# Patient Record
Sex: Female | Born: 1955
Health system: Southern US, Community
[De-identification: ages and names within clinical notes are randomized; demographics above are authoritative.]

## PROBLEM LIST (undated history)

## (undated) DIAGNOSIS — N6009 Solitary cyst of unspecified breast: Secondary | ICD-10-CM

## (undated) DIAGNOSIS — R51 Headache: Secondary | ICD-10-CM

## (undated) DIAGNOSIS — I10 Essential (primary) hypertension: Secondary | ICD-10-CM

## (undated) DIAGNOSIS — G8929 Other chronic pain: Secondary | ICD-10-CM

## (undated) DIAGNOSIS — F419 Anxiety disorder, unspecified: Secondary | ICD-10-CM

## (undated) DIAGNOSIS — K219 Gastro-esophageal reflux disease without esophagitis: Secondary | ICD-10-CM

## (undated) DIAGNOSIS — R519 Headache, unspecified: Secondary | ICD-10-CM

## (undated) DIAGNOSIS — M549 Dorsalgia, unspecified: Secondary | ICD-10-CM

## (undated) HISTORY — DX: Gastro-esophageal reflux disease without esophagitis: K21.9

## (undated) HISTORY — DX: Anxiety disorder, unspecified: F41.9

## (undated) HISTORY — DX: Solitary cyst of unspecified breast: N60.09

## (undated) HISTORY — PX: BREAST SURGERY: SHX581

## (undated) HISTORY — DX: Headache: R51

## (undated) HISTORY — PX: OTHER SURGICAL HISTORY: SHX169

## (undated) HISTORY — DX: Headache, unspecified: R51.9

## (undated) HISTORY — DX: Other chronic pain: G89.29

## (undated) HISTORY — DX: Dorsalgia, unspecified: M54.9

## (undated) HISTORY — DX: Hereditary hemochromatosis: E83.110

---
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
## 1999-03-29 HISTORY — PX: ABDOMINAL HYSTERECTOMY: SHX81

## 2005-05-26 LAB — HM COLONOSCOPY

## 2006-06-08 HISTORY — PX: COLONOSCOPY: SHX174

## 2011-10-14 ENCOUNTER — Encounter: Payer: Self-pay | Admitting: Internal Medicine

## 2011-10-14 ENCOUNTER — Ambulatory Visit (INDEPENDENT_AMBULATORY_CARE_PROVIDER_SITE_OTHER): Payer: Managed Care, Other (non HMO) | Admitting: Internal Medicine

## 2011-10-14 VITALS — BP 138/86 | HR 68 | Temp 98.5°F | Ht 62.0 in | Wt 212.0 lb

## 2011-10-14 DIAGNOSIS — Z1239 Encounter for other screening for malignant neoplasm of breast: Secondary | ICD-10-CM

## 2011-10-14 DIAGNOSIS — E781 Pure hyperglyceridemia: Secondary | ICD-10-CM

## 2011-10-14 DIAGNOSIS — E669 Obesity, unspecified: Secondary | ICD-10-CM

## 2011-10-14 DIAGNOSIS — E785 Hyperlipidemia, unspecified: Secondary | ICD-10-CM

## 2011-10-14 DIAGNOSIS — R5383 Other fatigue: Secondary | ICD-10-CM

## 2011-10-14 DIAGNOSIS — E559 Vitamin D deficiency, unspecified: Secondary | ICD-10-CM

## 2011-10-14 NOTE — Progress Notes (Signed)
Patient ID: Rhonda Brock, female   DOB: Apr 12, 1955, 56 y.o.   MRN: [id number]  Patient Active Problem List  Diagnosis  . Obesity (BMI 30-39.9)  . Vitamin d deficiency  . Hypertriglyceridemia    Subjective:  CC:   Chief Complaint  Patient presents with  . Establish Care    New pt to establish care    HPI:   Rhonda Brock is a 56 y.o. female who presents as a new patient to establish primary care with the chief complaint of  Need for new physician. 1) she has a history of Hypertriglyceridemia which is currently managed with diet alone. She has never had triglycerides of 300 and has no history of alcohol abuse diabetes or pancreatitis.Marland Kitchen  2) Bilteral pain back of the knees which started a few days ago. No recent travel or prolonged sitting. She has shoes which she has been wearing daily for the last several days which has a slightly lower heel and lower arch than her previously worn  Shoes.   Past Medical History  Diagnosis Date  . GERD (gastroesophageal reflux disease)   . Chronic headaches   . Anxiety   . Cyst (solitary) of breast     bilaterally  . Back pain     Past Surgical History  Procedure Date  . Appendectomy 1962  . Tonsillectomy 1970  . Abdominal hysterectomy 2001  . Breast surgery 2004, 2005    left breast cyst drained x 2  . Breast cysts     Family History  Problem Relation Age of Onset  . Cancer Mother 5    Breast Cancer-Mastectomy, Also Abdominal cancer  . Asthma Father     History   Social History  . Marital Status: Married    Spouse Name: N/A    Number of Children: N/A  . Years of Education: 14   Occupational History  . Homemaker    Social History Main Topics  . Smoking status: Never Smoker   . Smokeless tobacco: Never Used  . Alcohol Use: No  . Drug Use: No  . Sexually Active:    Other Topics Concern  . Not on file   Social History Narrative   Regular exercise-yesCaffeine use-yes    No Known Allergies     Review of  Systems:  Review of Systems  Constitutional: Negative for fever and malaise/fatigue.  HENT: Negative for nosebleeds and sore throat.   Eyes: Negative for blurred vision and pain.  Respiratory: Negative for cough, sputum production and shortness of breath.   Cardiovascular: Negative for chest pain, palpitations, orthopnea and claudication.  Gastrointestinal: Positive for heartburn. Negative for blood in stool.  Genitourinary: Negative for dysuria and frequency.  Musculoskeletal: Positive for back pain and falls.  Skin: Negative for rash.  Neurological: Negative for dizziness, seizures and headaches.  Endo/Heme/Allergies: Does not bruise/bleed easily.  Psychiatric/Behavioral: Negative for depression. The patient does not have insomnia.       Objective:  BP 138/86  Pulse 68  Temp 98.5 F (36.9 C) (Oral)  Ht 5\' 2"  (1.575 m)  Wt 212 lb (96.163 kg)  BMI 38.78 kg/m2  SpO2 97%  General appearance: alert, cooperative and appears stated age Ears: normal TM's and external ear canals both ears Throat: lips, mucosa, and tongue normal; teeth and gums normal Neck: no adenopathy, no carotid bruit, supple, symmetrical, trachea midline and thyroid not enlarged, symmetric, no tenderness/mass/nodules Back: symmetric, no curvature. ROM normal. No CVA tenderness. Lungs: clear to auscultation bilaterally Heart: regular rate and  rhythm, S1, S2 normal, no murmur, click, rub or gallop Abdomen: soft, non-tender; bowel sounds normal; no masses,  no organomegaly Pulses: 2+ and symmetric Skin: Skin color, texture, turgor normal. No rashes or lesions Lymph nodes: Cervical, supraclavicular, and axillary nodes normal.  Assessment and Plan:  Obesity (BMI 30-39.9) I have addressed  BMI and recommended a low glycemic index diet utilizing smaller more frequent meals to increase metabolism.  I have also recommended that patient start exercising with a goal of 30 minutes of aerobic exercise a minimum of 5 days  per week. Screening for lipid disorders, thyroid and diabetes to be done today.    Vitamin d deficiency Meds with weekly vitamin D megadosing. We'll check vitamin D level with next set of labs to make sure she is not overcompensating.  Hypertriglyceridemia Per patient mild. No she will return for fasting lipids. Low carbohydrate diet and exercise recommended until then. Discussed fish oil and niacin   Updated Medication List Outpatient Encounter Prescriptions as of 10/14/2011  Medication Sig Dispense Refill  . ergocalciferol (VITAMIN D2) 50000 UNITS capsule Take 50,000 Units by mouth once a week.         Orders Placed This Encounter  Procedures  . HM MAMMOGRAPHY  . MM Digital Screening  . HM PAP SMEAR  . TSH  . CBC with Differential  . Lipid panel  . Comprehensive metabolic panel  . HM COLONOSCOPY    No Follow-up on file.

## 2011-10-17 ENCOUNTER — Encounter: Payer: Self-pay | Admitting: Internal Medicine

## 2011-10-17 DIAGNOSIS — E669 Obesity, unspecified: Secondary | ICD-10-CM | POA: Insufficient documentation

## 2011-10-17 DIAGNOSIS — E559 Vitamin D deficiency, unspecified: Secondary | ICD-10-CM | POA: Insufficient documentation

## 2011-10-17 DIAGNOSIS — E781 Pure hyperglyceridemia: Secondary | ICD-10-CM | POA: Insufficient documentation

## 2011-10-17 DIAGNOSIS — E782 Mixed hyperlipidemia: Secondary | ICD-10-CM | POA: Insufficient documentation

## 2011-10-17 NOTE — Assessment & Plan Note (Signed)
Meds with weekly vitamin D megadosing. We'll check vitamin D level with next set of labs to make sure she is not overcompensating.

## 2011-10-17 NOTE — Assessment & Plan Note (Signed)
Per patient mild. No she will return for fasting lipids. Low carbohydrate diet and exercise recommended until then. Discussed fish oil and niacin

## 2011-10-17 NOTE — Assessment & Plan Note (Signed)
I have addressed  BMI and recommended a low glycemic index diet utilizing smaller more frequent meals to increase metabolism.  I have also recommended that patient start exercising with a goal of 30 minutes of aerobic exercise a minimum of 5 days per week. Screening for lipid disorders, thyroid and diabetes to be done today.   

## 2011-10-24 ENCOUNTER — Telehealth: Payer: Self-pay | Admitting: Internal Medicine

## 2011-10-24 NOTE — Telephone Encounter (Signed)
Recent labs reviewed liver kidney thyroid and CBC are all normal. Cholesterol total is 201 triglycerides 173 LDL 117 , the triglycerides are a little bit elevated ( normal is 150 or less).  She does not need medication but should try cutting back on  starches in her diet and if she has not started regular exercise program, begin one. Repeat in 6 months.

## 2011-10-25 NOTE — Telephone Encounter (Signed)
Tried calling patient, but phone just rang and rang and there was no way to leave a message, will try and call back later.

## 2011-11-01 NOTE — Telephone Encounter (Signed)
Patient notified of results.

## 2011-11-02 ENCOUNTER — Encounter: Payer: Self-pay | Admitting: Internal Medicine

## 2011-11-17 ENCOUNTER — Encounter: Payer: Self-pay | Admitting: Internal Medicine

## 2011-12-08 ENCOUNTER — Ambulatory Visit (INDEPENDENT_AMBULATORY_CARE_PROVIDER_SITE_OTHER): Payer: Managed Care, Other (non HMO) | Admitting: Internal Medicine

## 2011-12-08 ENCOUNTER — Encounter: Payer: Self-pay | Admitting: Internal Medicine

## 2011-12-08 VITALS — BP 130/86 | HR 74 | Temp 98.6°F | Resp 16 | Wt 212.5 lb

## 2011-12-08 DIAGNOSIS — N63 Unspecified lump in unspecified breast: Secondary | ICD-10-CM

## 2011-12-08 DIAGNOSIS — E559 Vitamin D deficiency, unspecified: Secondary | ICD-10-CM

## 2011-12-08 DIAGNOSIS — N6310 Unspecified lump in the right breast, unspecified quadrant: Secondary | ICD-10-CM

## 2011-12-08 NOTE — Progress Notes (Signed)
Patient ID: Rhonda Brock, female   DOB: 1956-03-26, 56 y.o.   MRN: [id number] Here for annual exam. Last Pelvic was done July 2012 by Dr. Harold Hedge. She is up-to-date on mammogram and colonoscopy. She has no new complaints. He is wearing her seatbelt when driving or riding in a car present time. She has been exercising regularly with walking program 4 days per week. She has not had any weight loss Subjective:     Rhonda Brock is a 56 y.o. female and is here for a comprehensive physical exam. The patient reports no problems.  History   Social History  . Marital Status: Married    Spouse Name: N/A    Number of Children: N/A  . Years of Education: 14   Occupational History  . Homemaker    Social History Main Topics  . Smoking status: Never Smoker   . Smokeless tobacco: Never Used  . Alcohol Use: No  . Drug Use: No  . Sexually Active:    Other Topics Concern  . Not on file   Social History Narrative   Regular exercise-yesCaffeine use-yes   Health Maintenance  Topic Date Due  . Influenza Vaccine  12/27/2011  . Pap Smear  09/25/2013  . Mammogram  11/06/2013  . Colonoscopy  05/27/2015  . Tetanus/tdap  03/28/2021    The following portions of the patient's history were reviewed and updated as appropriate: allergies, current medications, past family history, past medical history, past social history, past surgical history and problem list.  Review of Systems A comprehensive review of systems was negative.   Objective:    BP 130/86  Pulse 74  Temp 98.6 F (37 C) (Oral)  Resp 16  Wt 212 lb 8 oz (96.389 kg)  SpO2 97% General appearance: alert, cooperative and appears stated age Eyes: conjunctivae/corneas clear. PERRL, EOM's intact. Fundi benign. Ears: normal TM's and external ear canals both ears Throat: lips, mucosa, and tongue normal; teeth and gums normal Neck: no adenopathy, no carotid bruit, no JVD, supple, symmetrical, trachea midline and thyroid not enlarged, symmetric,  no tenderness/mass/nodules Back: symmetric, no curvature. ROM normal. No CVA tenderness. Lungs: clear to auscultation bilaterally Breasts: normal appearance, no masses or tenderness, positive findings: firm nodule located on the left upper outer quadrant Heart: regular rate and rhythm, S1, S2 normal, no murmur, click, rub or gallop Abdomen: soft, non-tender; bowel sounds normal; no masses,  no organomegaly Pelvic: cervix normal in appearance, external genitalia normal, no adnexal masses or tenderness, no cervical motion tenderness, rectovaginal septum normal, uterus normal size, shape, and consistency and vagina normal without discharge Extremities: extremities normal, atraumatic, no cyanosis or edema Pulses: 2+ and symmetric Skin: Skin color, texture, turgor normal. No rashes or lesions Lymph nodes: Cervical, supraclavicular, and axillary nodes normal. Neurologic: Grossly normal    Assessment:    Healthy female exam.    breast nodule noted and daignostic mammogram ordered.   Mass of breast, right Lateral nipple   Iron overload Per patient she was told that her iron stores were high in the past but no further workup was done. We will repeat these today.  Vitamin d deficiency Repeat vitamin D level ordered for today.   Updated Medication List Outpatient Encounter Prescriptions as of 12/08/2011  Medication Sig Dispense Refill  . ergocalciferol (VITAMIN D2) 50000 UNITS capsule Take 50,000 Units by mouth once a week.

## 2011-12-08 NOTE — Patient Instructions (Addendum)
We are checking iron and vitamin D today  And are scheduling a diagnostic mammogram on the right breast to check out the lump that I felt on the right side of your nipple

## 2011-12-08 NOTE — Assessment & Plan Note (Signed)
Lateral nipple

## 2011-12-09 ENCOUNTER — Encounter: Payer: Self-pay | Admitting: Internal Medicine

## 2011-12-09 LAB — IRON AND TIBC
TIBC: 221 ug/dL — ABNORMAL LOW (ref 250–470)
UIBC: 81 ug/dL — ABNORMAL LOW (ref 125–400)

## 2011-12-09 NOTE — Addendum Note (Signed)
Addended by: Duncan Dull on: 12/09/2011 07:26 AM   Modules accepted: Orders

## 2011-12-09 NOTE — Assessment & Plan Note (Signed)
Repeat vitamin D level ordered for today.

## 2011-12-09 NOTE — Assessment & Plan Note (Signed)
Per patient she was told that her iron stores were high in the past but no further workup was done. We will repeat these today.

## 2011-12-16 ENCOUNTER — Ambulatory Visit: Payer: Self-pay | Admitting: Hematology and Oncology

## 2011-12-16 LAB — IRON AND TIBC
Iron Bind.Cap.(Total): 226 ug/dL — ABNORMAL LOW (ref 250–450)
Iron Saturation: 58 %
Iron: 130 ug/dL (ref 50–170)
Unbound Iron-Bind.Cap.: 96 ug/dL

## 2011-12-16 LAB — CBC CANCER CENTER
Basophil #: 0.1 x10 3/mm (ref 0.0–0.1)
Basophil %: 0.8 %
Eosinophil #: 0.4 x10 3/mm (ref 0.0–0.7)
Lymphocyte %: 23.8 %
MCH: 31.9 pg (ref 26.0–34.0)
MCHC: 33.4 g/dL (ref 32.0–36.0)
Monocyte #: 0.7 x10 3/mm (ref 0.2–0.9)
Neutrophil #: 6.7 x10 3/mm — ABNORMAL HIGH (ref 1.4–6.5)
Neutrophil %: 64.8 %
RDW: 13.1 % (ref 11.5–14.5)

## 2011-12-18 ENCOUNTER — Telehealth: Payer: Self-pay | Admitting: Internal Medicine

## 2011-12-18 ENCOUNTER — Encounter: Payer: Self-pay | Admitting: Internal Medicine

## 2011-12-18 DIAGNOSIS — N6310 Unspecified lump in the right breast, unspecified quadrant: Secondary | ICD-10-CM

## 2011-12-18 NOTE — Telephone Encounter (Signed)
Targeted ultrasound and diagnostic mammogram of the area of concern on the right breast  were normal.

## 2011-12-18 NOTE — Assessment & Plan Note (Signed)
Targeted ultrasound and diagnostic mammogram of the area of concern were normal.

## 2011-12-19 NOTE — Telephone Encounter (Signed)
Message left notifying patient.

## 2011-12-26 ENCOUNTER — Encounter: Payer: Self-pay | Admitting: Internal Medicine

## 2011-12-27 ENCOUNTER — Ambulatory Visit: Payer: Self-pay | Admitting: Hematology and Oncology

## 2012-01-27 ENCOUNTER — Ambulatory Visit: Payer: Self-pay | Admitting: Hematology and Oncology

## 2012-05-12 ENCOUNTER — Other Ambulatory Visit: Payer: Self-pay

## 2013-01-31 ENCOUNTER — Other Ambulatory Visit: Payer: Self-pay

## 2014-02-19 LAB — HM MAMMOGRAPHY: HM MAMMO: NEGATIVE

## 2014-03-10 ENCOUNTER — Encounter: Payer: Self-pay | Admitting: Internal Medicine

## 2014-03-12 LAB — HM PAP SMEAR

## 2014-03-17 ENCOUNTER — Encounter: Payer: Self-pay | Admitting: Internal Medicine

## 2014-04-22 ENCOUNTER — Encounter: Payer: Self-pay | Admitting: Internal Medicine

## 2014-06-30 ENCOUNTER — Encounter: Payer: Self-pay | Admitting: Internal Medicine

## 2014-06-30 ENCOUNTER — Ambulatory Visit (INDEPENDENT_AMBULATORY_CARE_PROVIDER_SITE_OTHER): Payer: Managed Care, Other (non HMO) | Admitting: Internal Medicine

## 2014-06-30 VITALS — BP 158/98 | HR 70 | Temp 98.7°F | Resp 16 | Ht 65.0 in | Wt 214.5 lb

## 2014-06-30 DIAGNOSIS — E669 Obesity, unspecified: Secondary | ICD-10-CM

## 2014-06-30 DIAGNOSIS — E785 Hyperlipidemia, unspecified: Secondary | ICD-10-CM

## 2014-06-30 DIAGNOSIS — E559 Vitamin D deficiency, unspecified: Secondary | ICD-10-CM | POA: Diagnosis not present

## 2014-06-30 DIAGNOSIS — E781 Pure hyperglyceridemia: Secondary | ICD-10-CM

## 2014-06-30 DIAGNOSIS — R030 Elevated blood-pressure reading, without diagnosis of hypertension: Secondary | ICD-10-CM | POA: Diagnosis not present

## 2014-06-30 DIAGNOSIS — R140 Abdominal distension (gaseous): Secondary | ICD-10-CM

## 2014-06-30 DIAGNOSIS — D649 Anemia, unspecified: Secondary | ICD-10-CM | POA: Diagnosis not present

## 2014-06-30 LAB — CBC WITH DIFFERENTIAL/PLATELET
Basophils Absolute: 0.1 10*3/uL (ref 0.0–0.1)
Basophils Relative: 0.7 % (ref 0.0–3.0)
EOS PCT: 4 % (ref 0.0–5.0)
Eosinophils Absolute: 0.3 10*3/uL (ref 0.0–0.7)
HCT: 42.1 % (ref 36.0–46.0)
Hemoglobin: 14.5 g/dL (ref 12.0–15.0)
LYMPHS PCT: 27.7 % (ref 12.0–46.0)
Lymphs Abs: 2.4 10*3/uL (ref 0.7–4.0)
MCHC: 34.5 g/dL (ref 30.0–36.0)
MCV: 93.4 fl (ref 78.0–100.0)
MONOS PCT: 6.8 % (ref 3.0–12.0)
Monocytes Absolute: 0.6 10*3/uL (ref 0.1–1.0)
NEUTROS ABS: 5.3 10*3/uL (ref 1.4–7.7)
NEUTROS PCT: 60.8 % (ref 43.0–77.0)
Platelets: 300 10*3/uL (ref 150.0–400.0)
RBC: 4.5 Mil/uL (ref 3.87–5.11)
RDW: 13.1 % (ref 11.5–15.5)
WBC: 8.7 10*3/uL (ref 4.0–10.5)

## 2014-06-30 LAB — COMPREHENSIVE METABOLIC PANEL
ALT: 24 U/L (ref 0–35)
AST: 22 U/L (ref 0–37)
Albumin: 4 g/dL (ref 3.5–5.2)
Alkaline Phosphatase: 71 U/L (ref 39–117)
BILIRUBIN TOTAL: 0.5 mg/dL (ref 0.2–1.2)
BUN: 16 mg/dL (ref 6–23)
CO2: 30 mEq/L (ref 19–32)
CREATININE: 0.87 mg/dL (ref 0.40–1.20)
Calcium: 9.9 mg/dL (ref 8.4–10.5)
Chloride: 106 mEq/L (ref 96–112)
GFR: 70.8 mL/min (ref >60.00)
GLUCOSE: 97 mg/dL (ref 70–99)
Potassium: 4.4 mEq/L (ref 3.5–5.1)
Sodium: 139 mEq/L (ref 135–145)
Total Protein: 7 g/dL (ref 6.0–8.3)

## 2014-06-30 LAB — LIPID PANEL
CHOL/HDL RATIO: 4
Cholesterol: 187 mg/dL (ref 0–200)
HDL: 44.9 mg/dL (ref >39.00)
NonHDL: 142.1
Triglycerides: 231 mg/dL — ABNORMAL HIGH (ref 0.0–149.0)
VLDL: 46.2 mg/dL — ABNORMAL HIGH (ref 0.0–40.0)

## 2014-06-30 LAB — VITAMIN D 25 HYDROXY (VIT D DEFICIENCY, FRACTURES): VITD: 37.45 ng/mL (ref 30.00–100.00)

## 2014-06-30 LAB — LDL CHOLESTEROL, DIRECT: LDL DIRECT: 119 mg/dL

## 2014-06-30 LAB — TSH: TSH: 2.21 u[IU]/mL (ref 0.35–4.50)

## 2014-06-30 NOTE — Progress Notes (Signed)
Pre-visit discussion using our clinic review tool. No additional management support is needed unless otherwise documented below in the visit note.  

## 2014-06-30 NOTE — Progress Notes (Signed)
Patient ID: Rhonda Brock, female   DOB: 09/05/1955, 59 y.o.   MRN: [id number] Patient Active Problem List   Diagnosis Date Noted  . Blood pressure elevated without history of HTN 07/01/2014  . Abdominal distension (gaseous) 07/01/2014  . Iron overload 12/09/2011  . Mass of breast, right 12/08/2011  . Obesity (BMI 30-39.9) 10/17/2011  . Vitamin D deficiency 10/17/2011  . Hypertriglyceridemia 10/17/2011    Subjective:  CC:   Chief Complaint  Patient presents with  . Follow-up    General check up, tightness in theabdpominal area  . Headache    Morning headache x2 weekly     HPI:   Rhonda Brock is a 59 y.o. female who presents for evaluation of multiple issues, was last seen Sept 2013.   1) abdominal distension and tihtness occurring daily for the past week, not occurring with activity or exercise,   More often after she eats.   Slight nausea no dyspnea accompanying symptoms Bowels moving regularly sometimes twice daily. Occures mostly at home,  Not at work or when out of the house.  Wonders is stress is a factor.    2) occasional headache not daily for the past 2 weeks,  occurring x 2 weeks.  Using ibuprofen   3) Obesity.  She is not exercising regularly or making a concerted effort to lose weight.   4) Hypertension: BP is elevated today. She has no history of hypertension,  But has been using NSAIDs on a daily basis for headaches.     Past Medical History  Diagnosis Date  . GERD (gastroesophageal reflux disease)   . Chronic headaches   . Anxiety   . Cyst (solitary) of breast     bilaterally  . Back pain     Past Surgical History  Procedure Laterality Date  . Appendectomy  1962  . Tonsillectomy  1970  . Abdominal hysterectomy  2001  . Breast surgery  2004, 2005    left breast cyst drained x 2  . Breast cysts         The following portions of the patient's history were reviewed and updated as appropriate: Allergies, current medications, and problem  list.    Review of Systems:   Patient denies headache, fevers, malaise, unintentional weight loss, skin rash, eye pain, sinus congestion and sinus pain, sore throat, dysphagia,  hemoptysis , cough, dyspnea, wheezing, chest pain, palpitations, orthopnea, edema, abdominal pain, nausea, melena, diarrhea, constipation, flank pain, dysuria, hematuria, urinary  Frequency, nocturia, numbness, tingling, seizures,  Focal weakness, Loss of consciousness,  Tremor, insomnia, depression, anxiety, and suicidal ideation.     History   Social History  . Marital Status: Married    Spouse Name: N/A  . Number of Children: N/A  . Years of Education: 14   Occupational History  . Homemaker    Social History Main Topics  . Smoking status: Never Smoker   . Smokeless tobacco: Never Used  . Alcohol Use: No  . Drug Use: No  . Sexual Activity: Not on file   Other Topics Concern  . Not on file   Social History Narrative   Regular exercise-yes   Caffeine use-yes          Objective:  Filed Vitals:   06/30/14 0809  BP: 158/98  Pulse: 70  Temp: 98.7 F (37.1 C)  Resp: 16     General appearance: alert, cooperative and appears stated age Ears: normal TM's and external ear canals both ears Throat: lips, mucosa, and tongue  normal; teeth and gums normal Neck: no adenopathy, no carotid bruit, supple, symmetrical, trachea midline and thyroid not enlarged, symmetric, no tenderness/mass/nodules Back: symmetric, no curvature. ROM normal. No CVA tenderness. Lungs: clear to auscultation bilaterally Heart: regular rate and rhythm, S1, S2 normal, no murmur, click, rub or gallop Abdomen: soft, non-tender; bowel sounds normal; no masses,  no organomegaly Pulses: 2+ and symmetric Skin: Skin color, texture, turgor normal. No rashes or lesions Lymph nodes: Cervical, supraclavicular, and axillary nodes normal.  Assessment and Plan:  Blood pressure elevated without history of HTN She ha been advised to  suspend all NSAIDs and return in one week for BP check.    Obesity (BMI 30-39.9) I have addressed  BMI and recommended a low glycemic index diet utilizing smaller more frequent meals to increase metabolism.  I have also recommended that patient start exercising with a goal of 30 minutes of aerobic exercise a minimum of 5 days per week. Screening for lipid disorders, thyroid and diabetes to be done today.  Lab Results  Component Value Date   TSH 2.21 06/30/2014   Lab Results  Component Value Date   CHOL 187 06/30/2014   HDL 44.90 06/30/2014   LDLDIRECT 119.0 06/30/2014   TRIG 231.0* 06/30/2014   CHOLHDL 4 06/30/2014     Hypertriglyceridemia Mild, with normal LDL and HDL. .  Low GI diet and exercise program recommended.    Abdominal distension (gaseous) Exam suggests gas vs cholecystitis vs IBS>   Trial of Beano and Gas X.  If no improvement   Ultrasound.  ,        Iron overload Her  iron stores remain slightly elevated but her hgb is normal , as are her liver enzymes.      Updated Medication List Outpatient Encounter Prescriptions as of 06/30/2014  Medication Sig  . Cholecalciferol (VITAMIN D3) 5000 UNITS CAPS Take 1 capsule by mouth daily.  . [DISCONTINUED] ergocalciferol (VITAMIN D2) 50000 UNITS capsule Take 50,000 Units by mouth once a week.     Orders Placed This Encounter  Procedures  . CBC with Differential/Platelet  . Comprehensive metabolic panel  . TSH  . Lipid panel  . Vit D  25 hydroxy (rtn osteoporosis monitoring)  . Iron and TIBC  . LDL cholesterol, direct    Return in about 1 week (around 07/07/2014).

## 2014-06-30 NOTE — Patient Instructions (Signed)
Our abdominal distension may be due to increased gas follosing a healthy meal.  Please try taking Beano (tablets or liquid) with lunch.  Rhonda Brock can also try Gax x afterward if the symptoms recur  If this does not help, I recommend getting an abdominal ultrasound  Please return in one week for a BP check .  DO NOT TAKE IBUPROFEN for 24 hours prior to BP check.  Your BMI is 36.  I want you to address your weight this year.   This is  my version of a  "Low GI"  Weight loss Diet:  It will allow you to lose 4 to 8  lbs  per month if you follow it carefully.  Your goal with exercise is a minimum of 30 minutes of aerobic exercise 5 days per week (Walking does not count once it becomes easy!)     All of the foods can be found at grocery stores and in bulk at Rhonda Brock.  The Rhonda Brock protein bars and shakes are available in more varieties at Rhonda Brock, Rhonda Brock and Rhonda Brock.     7 AM Breakfast:  Choose from the following:  Low carbohydrate Protein  Shakes (I recommend the EAS AdvantEdge "Carb Control" shakes, Rhonda Brock,  Muscle Milk or Premier Protein shakes  All are < 4  carbs   a scrambled egg/bacon/cheese burrito made with [employer]'s "carb balance" whole wheat tortilla  (about 10 net carbs )  A slice of home made fritatta (egg based dish without a crust:  google it)    Avoid cereal and bananas, oatmeal and cream of wheat and grits. They are loaded with carbohydrates!   10 AM: high protein snack  Protein bar by Rhonda Brock  Or Rhonda Brock  (the snack size, under 200 cal, usually < 6 net carbs).    A stick of cheese:  Around 1 carb,  100 cal      Other so called "protein bars" and Greek yogurts tend to be loaded with carbohydrates.  Remember, in food advertising, the word "energy" is synonymous for " carbohydrate."  Lunch:   A Sandwich using the bread choices listed, Can use any  Eggs,  lunchmeat, grilled meat or canned tuna), avocado, regular mayo/mustard  and cheese.  A Salad using blue cheese, ranch,  Goddess  or vinagrette,  No croutons or "confetti" and no "candied nuts" but regular nuts OK.   No pretzels or chips.  Pickles and miniature sweet peppers are a good low carb alternative that provide a "crunch"  The bread is the only source of carbohydrate in a sandwich and  can be decreased by trying some of these alternatives to traditional loaf bread  Rhonda Brock makes a pita bread and a flat bread that are 50 cal and 4 net carbs available at Rhonda Brock and Rhonda Brock.  This can be toasted to use with hummous as well  Toufayan makes a low carb flatbread that's 100 cal and 9 net carbs available at Rhonda Brock and Rhonda Brock makes 2 sizes of  Low carb whole wheat tortilla  (The large one is 210 cal and 6 net carbs)  Flat Out makes flatbreads that are low carb as well  Avoid "Low fat dressings, as well as Barry Brunner and [place] dressings They are loaded with sugar!   3 PM/ Mid day  Snack:  Consider  1 ounce of  almonds, walnuts, pistachios, pecans, peanuts,  Macadamia nuts or a nut medley.  Avoid "granola"; the dried cranberries and raisins are  loaded with carbohydrates. Mixed nuts as long as there are no raisins,  cranberries or dried fruit.    Try the prosciutto/mozzarella cheese sticks by [employer]  In deli /backery section   High protein   To avoid overindulging in snacks: Try drinking a glass of unsweeted almond/coconut milk  Or a cup of coffee with your Rhonda Brock chocolate bar to keep you from having 3!!!   Pork rinds!  Yes Pork Rinds        6 PM  Dinner:     Meat/fowl/fish with a green salad, and either broccoli, cauliflower, green beans, spinach, brussel sprouts or  Lima beans. DO NOT BREAD THE PROTEIN!!      There is a low carb pasta by Dreamfield's that is acceptable and tastes great: only 5 digestible carbs/serving.( All grocery stores but BJs carry it )  Try Hurley Cisco Angelo's chicken piccata or chicken or eggplant parm over low carb pasta.(Lowes and BJs)   Marjory Lies Sanchez's "Carnitas" (pulled pork, no  sauce,  0 carbs) or his beef pot roast to make a dinner burrito (at Rhonda Brock)  Pesto over low carb pasta (Rhonda Brock sells a good quality pesto in the center refrigerated section of the deli   Try satueeing  Cheral Marker with mushroooms  Whole wheat pasta is still full of digestible carbs and  Not as low in glycemic index as Dreamfield's.   Brown rice is still rice,  So skip the rice and noodles if you eat [place] or [place] (or at least limit to 1/2 cup)  9 PM snack :   Breyer's "low carb" fudgsicle or  ice cream bar (Carb Smart line), or  Weight Watcher's ice cream bar , or another "no sugar added" ice cream;  a serving of fresh berries/cherries with whipped cream   Cheese or DANNON[employer]S LlGHT [employer] G[employer] YOGURT or the [employer] greek yogurt   8 ounces of [place] unsweetened almond/cococunut milk  Cheese and crackers (using WASA crackers,  They are low carb) or peanut butter on low carb crackers or pita bread     Avoid bananas, pineapple, grapes  and watermelon on a regular basis because they are high in sugar.  THINK OF THEM AS DESSERT  Remember that snack Substitutions should be less than 10 NET carbs per serving and meals should be < 20 net carbs. Remember that carbohydrates from fiber do not affect blood sugar, so you can  subtract fiber grams to get the "net carbs " of any particular food item.

## 2014-07-01 ENCOUNTER — Encounter: Payer: Self-pay | Admitting: Internal Medicine

## 2014-07-01 DIAGNOSIS — R140 Abdominal distension (gaseous): Secondary | ICD-10-CM | POA: Insufficient documentation

## 2014-07-01 DIAGNOSIS — I10 Essential (primary) hypertension: Secondary | ICD-10-CM | POA: Insufficient documentation

## 2014-07-01 LAB — IRON AND TIBC
%SAT: 60 % — ABNORMAL HIGH (ref 20–55)
Iron: 142 ug/dL (ref 42–145)
TIBC: 237 ug/dL — AB (ref 250–470)
UIBC: 95 ug/dL — AB (ref 125–400)

## 2014-07-01 NOTE — Assessment & Plan Note (Signed)
She ha been advised to suspend all NSAIDs and return in one week for BP check.

## 2014-07-01 NOTE — Assessment & Plan Note (Signed)
Exam suggests gas vs cholecystitis vs IBS>   Trial of Beano and Gas X.  If no improvement   Ultrasound.  ,

## 2014-07-01 NOTE — Assessment & Plan Note (Signed)
Mild, with normal LDL and HDL. .  Low GI diet and exercise program recommended.

## 2014-07-01 NOTE — Assessment & Plan Note (Signed)
Her  iron stores remain slightly elevated but her hgb is normal , as are her liver enzymes.

## 2014-07-01 NOTE — Assessment & Plan Note (Signed)
I have addressed  BMI and recommended a low glycemic index diet utilizing smaller more frequent meals to increase metabolism.  I have also recommended that patient start exercising with a goal of 30 minutes of aerobic exercise a minimum of 5 days per week. Screening for lipid disorders, thyroid and diabetes to be done today.  Lab Results  Component Value Date   TSH 2.21 06/30/2014   Lab Results  Component Value Date   CHOL 187 06/30/2014   HDL 44.90 06/30/2014   LDLDIRECT 119.0 06/30/2014   TRIG 231.0* 06/30/2014   CHOLHDL 4 06/30/2014

## 2014-07-04 ENCOUNTER — Encounter: Payer: Self-pay | Admitting: Internal Medicine

## 2014-07-07 ENCOUNTER — Other Ambulatory Visit: Payer: Self-pay | Admitting: Internal Medicine

## 2014-07-09 ENCOUNTER — Telehealth: Payer: Self-pay | Admitting: Internal Medicine

## 2014-07-09 ENCOUNTER — Ambulatory Visit (INDEPENDENT_AMBULATORY_CARE_PROVIDER_SITE_OTHER): Payer: Managed Care, Other (non HMO) | Admitting: *Deleted

## 2014-07-09 DIAGNOSIS — R030 Elevated blood-pressure reading, without diagnosis of hypertension: Secondary | ICD-10-CM

## 2014-07-09 MED ORDER — HYDROCHLOROTHIAZIDE 12.5 MG PO TABS: 12.5000 mg | ORAL_TABLET | Freq: Every day | ORAL | Status: DC

## 2014-07-09 NOTE — Progress Notes (Signed)
Reviewed,  See telephone message.

## 2014-07-09 NOTE — Telephone Encounter (Signed)
Still high (goal is 130/80).  Recommend starting hctz 12.5 mg daily in the AM ,  rx sent.  Repeat Basic metabolic panel and BP check 1 month

## 2014-07-09 NOTE — Telephone Encounter (Signed)
Patient presented today for BP check patient was allowed to sit for 5 min before BP check.. BP taken in right arm 138/ 86 large cuff pulse 71 02 sat @ 98%. Patient BP on 06/30/14 was  Recorded at 158/98 pulse 70.

## 2014-07-09 NOTE — Progress Notes (Signed)
Patient presented today for BP check patient was allowed to sit for 5 min before BP check.. BP taken in right arm 138/ 86 large cuff pulse 71 02 sat @ 98%. Patient BP on 06/30/14 was  Recorded at 158/98 pulse 70.

## 2014-07-09 NOTE — Telephone Encounter (Signed)
Patient notified and voiced understanding, patient stated she would like to call back later for a lab appointment and Nurse visit.

## 2014-07-18 ENCOUNTER — Encounter: Payer: Self-pay | Admitting: Internal Medicine

## 2014-07-18 ENCOUNTER — Ambulatory Visit
Admit: 2014-07-18 | Disposition: A | Discharge status: Home / Self Care | Payer: Self-pay | Attending: Oncology | Admitting: Oncology

## 2014-07-18 ENCOUNTER — Ambulatory Visit: Admit: 2014-07-18 | Payer: Self-pay | Admitting: Oncology

## 2014-07-19 ENCOUNTER — Encounter: Payer: Self-pay | Admitting: Internal Medicine

## 2014-08-03 ENCOUNTER — Encounter: Payer: Self-pay | Admitting: Internal Medicine

## 2014-08-04 ENCOUNTER — Encounter: Payer: Self-pay | Admitting: Internal Medicine

## 2014-08-04 ENCOUNTER — Ambulatory Visit (INDEPENDENT_AMBULATORY_CARE_PROVIDER_SITE_OTHER): Payer: Managed Care, Other (non HMO) | Admitting: Internal Medicine

## 2014-08-04 VITALS — BP 156/88 | HR 75 | Temp 98.6°F | Resp 14 | Ht 65.0 in | Wt 209.8 lb

## 2014-08-04 DIAGNOSIS — R140 Abdominal distension (gaseous): Secondary | ICD-10-CM

## 2014-08-04 DIAGNOSIS — R110 Nausea: Secondary | ICD-10-CM

## 2014-08-04 DIAGNOSIS — K5900 Constipation, unspecified: Secondary | ICD-10-CM | POA: Diagnosis not present

## 2014-08-04 DIAGNOSIS — R194 Change in bowel habit: Secondary | ICD-10-CM | POA: Diagnosis not present

## 2014-08-04 DIAGNOSIS — R195 Other fecal abnormalities: Secondary | ICD-10-CM

## 2014-08-04 DIAGNOSIS — R198 Other specified symptoms and signs involving the digestive system and abdomen: Secondary | ICD-10-CM

## 2014-08-04 LAB — CBC WITH DIFFERENTIAL/PLATELET
BASOS ABS: 0.1 10*3/uL (ref 0.0–0.1)
Basophils Relative: 0.4 % (ref 0.0–3.0)
EOS ABS: 3.1 10*3/uL — AB (ref 0.0–0.7)
Eosinophils Relative: 23.3 % — ABNORMAL HIGH (ref 0.0–5.0)
HCT: 40.2 % (ref 36.0–46.0)
Hemoglobin: 13.7 g/dL (ref 12.0–15.0)
LYMPHS ABS: 2.6 10*3/uL (ref 0.7–4.0)
Lymphocytes Relative: 19.4 % (ref 12.0–46.0)
MCHC: 34.2 g/dL (ref 30.0–36.0)
MCV: 93.1 fl (ref 78.0–100.0)
MONOS PCT: 5 % (ref 3.0–12.0)
Monocytes Absolute: 0.7 10*3/uL (ref 0.1–1.0)
Neutro Abs: 7 10*3/uL (ref 1.4–7.7)
Neutrophils Relative %: 51.9 % (ref 43.0–77.0)
Platelets: 277 10*3/uL (ref 150.0–400.0)
RBC: 4.31 Mil/uL (ref 3.87–5.11)
RDW: 13.8 % (ref 11.5–15.5)
WBC: 13.4 10*3/uL — ABNORMAL HIGH (ref 4.0–10.5)

## 2014-08-04 LAB — COMPREHENSIVE METABOLIC PANEL
ALT: 22 U/L (ref 0–35)
AST: 23 U/L (ref 0–37)
Albumin: 3.9 g/dL (ref 3.5–5.2)
Alkaline Phosphatase: 93 U/L (ref 39–117)
BUN: 13 mg/dL (ref 6–23)
CHLORIDE: 106 meq/L (ref 96–112)
CO2: 28 meq/L (ref 19–32)
CREATININE: 0.9 mg/dL (ref 0.40–1.20)
Calcium: 9.3 mg/dL (ref 8.4–10.5)
GFR: 68.06 mL/min (ref >60.00)
Glucose, Bld: 87 mg/dL (ref 70–99)
Potassium: 4.3 mEq/L (ref 3.5–5.1)
Sodium: 140 mEq/L (ref 135–145)
Total Bilirubin: 0.7 mg/dL (ref 0.2–1.2)
Total Protein: 6.6 g/dL (ref 6.0–8.3)

## 2014-08-04 LAB — LIPASE: Lipase: 20 U/L (ref 11.0–59.0)

## 2014-08-04 MED ORDER — BLOOD PRESSURE KIT: PACK | Status: DC

## 2014-08-04 MED ORDER — AMLODIPINE BESYLATE 2.5 MG PO TABS: 2.5000 mg | ORAL_TABLET | Freq: Every day | ORAL | Status: DC

## 2014-08-04 MED ORDER — ONDANSETRON 4 MG PO TBDP: 4.0000 mg | ORAL_TABLET | Freq: Three times a day (TID) | ORAL | Status: DC | PRN

## 2014-08-04 NOTE — Patient Instructions (Addendum)
Your symptoms may be comign from severe constipation  I am prescribing zofran for nausea ,    Amlodipine 2.5 mg for blood pressure > 947 systolic   Plain abd films and labs today to evaluate your bowle gas pattern and stool burden  If you are ,  We will "clean you out" and if sympotms resolve,  No further workup required  If not,  We will set you up for ultrasound of gallbladder

## 2014-08-04 NOTE — Progress Notes (Signed)
Patient ID: Rhonda Brock, female   DOB: [birth year], 59 y.o.   MRN: [id number]  Patient Active Problem List   Diagnosis Date Noted  . Change in bowel function 08/05/2014  . Blood pressure elevated without history of HTN 07/01/2014  . Abdominal distension (gaseous) 07/01/2014  . Iron overload 12/09/2011  . Mass of breast, right 12/08/2011  . Obesity (BMI 30-39.9) 10/17/2011  . Vitamin D deficiency 10/17/2011  . Hypertriglyceridemia 10/17/2011    Subjective:  CC:   Chief Complaint  Patient presents with  . Acute Visit    GI issues    HPI:   Rhonda Brock is a 59 y.o. female who presents for Follow up on GI symptoms per mychart message received this morning :  "My GI problems have gotten worse. At first, the Beano helped. But now, since last Wed, 5/4, I've been feeling queasy a lot, when I get up, but especially after meals. No vomiting, just queasy and sometimes a bit dizzy. Then late afternoon or by bedtime, I get liquid stool. This pretty much every day, in varying degrees.  I stopped the Beano and Benefiber 4/30 thinking it was one or the other, but symptoms continued.  Today, 5/8, queasy all day. Lots of gurgling sounds in stomach and abdomen.   5 lb wt lss since April 4, intentional.  For the last 4 days has  had epsiodes of  Normal consistency stools followed by gas and liquid stool.    Diet reveiwed: Marland Kitchen  Felt bloated after eating , yesterday was the worst day .pasta, salad, canned tuna,  Occasional sardines,  Felt pressure in mid epigastric area  So took Beano    History in early April;   abdominal distension and tihtness occurring daily for the past week, not occurring with activity or exercise,   More often after she eats.   Slight nausea no dyspnea accompanying symptoms Bowels moving regularly sometimes twice daily. Occures mostly at home,  Not at work or when out of the house.  Wonders is stress is a factor.    2) occasional headache not daily for the past 2  weeks,  occurring x 2 weeks.  Using ibuprofen   3) Obesity.  She is not exercising regularly or making a concerted effort to lose weight.   4) Hypertension: BP is elevated today. She has no history of hypertension,  But has been using NSAIDs on a daily basis for headaches.     Past Medical History  Diagnosis Date  . GERD (gastroesophageal reflux disease)   . Chronic headaches   . Anxiety   . Cyst (solitary) of breast     bilaterally  . Back pain     Past Surgical History  Procedure Laterality Date  . Appendectomy  1962  . Tonsillectomy  1970  . Abdominal hysterectomy  2001  . Breast surgery  2004, 2005    left breast cyst drained x 2  . Breast cysts         The following portions of the patient's history were reviewed and updated as appropriate: Allergies, current medications, and problem list.    Review of Systems:   Patient denies headache, fevers, malaise, unintentional weight loss, skin rash, eye pain, sinus congestion and sinus pain, sore throat, dysphagia,  hemoptysis , cough, dyspnea, wheezing, chest pain, palpitations, orthopnea, edema, abdominal pain, nausea, melena, diarrhea, constipation, flank pain, dysuria, hematuria, urinary  Frequency, nocturia, numbness, tingling, seizures,  Focal weakness, Loss of consciousness,  Tremor, insomnia, depression, anxiety, and  suicidal ideation.     History   Social History  . Marital Status: Married    Spouse Name: N/A  . Number of Children: N/A  . Years of Education: 14   Occupational History  . Homemaker    Social History Main Topics  . Smoking status: Never Smoker   . Smokeless tobacco: Never Used  . Alcohol Use: No  . Drug Use: No  . Sexual Activity: Not on file   Other Topics Concern  . Not on file   Social History Narrative   Regular exercise-yes   Caffeine use-yes          Objective:  Filed Vitals:   08/04/14 1447  BP: 156/88  Pulse: 75  Temp: 98.6 F (37 C)  Resp: 14     General  appearance: alert, cooperative and appears stated age Ears: normal TM's and external ear canals both ears Throat: lips, mucosa, and tongue normal; teeth and gums normal Neck: no adenopathy, no carotid bruit, supple, symmetrical, trachea midline and thyroid not enlarged, symmetric, no tenderness/mass/nodules Back: symmetric, no curvature. ROM normal. No CVA tenderness. Lungs: clear to auscultation bilaterally Heart: regular rate and rhythm, S1, S2 normal, no murmur, click, rub or gallop Abdomen: soft, non-tender; bowel sounds normal; no masses,  no organomegaly Pulses: 2+ and symmetric Skin: Skin color, texture, turgor normal. No rashes or lesions Lymph nodes: Cervical, supraclavicular, and axillary nodes normal.  Assessment and Plan:  Abdominal distension (gaseous) She is not tender in the RUQ and liver enzymes are normal.  Bowel gas pattern is normal on plain films.    Lab Results  Component Value Date   ALT 22 08/04/2014   AST 23 08/04/2014   ALKPHOS 93 08/04/2014   BILITOT 0.7 08/04/2014   Lab Results  Component Value Date   LIPASE 20.0 08/04/2014      Change in bowel function Patient has had altered stool habits accompanied by nausea and abdominal distension for the past 5 weeks.  Plain films were unrevealing , labs were normal except  for a mild leukocytosis significant eosinophilia. (3.1 K  /microliter ). DDX includes  H PYlori gastritis, IBD, collagenous colitis. Will refer to GI for endoscopy  /colonoscopy,.     Updated Medication List Outpatient Encounter Prescriptions as of 08/04/2014  Medication Sig  . Cholecalciferol (VITAMIN D3) 5000 UNITS CAPS Take 1 capsule by mouth daily.  Marland Kitchen amLODipine (NORVASC) 2.5 MG tablet Take 1 tablet (2.5 mg total) by mouth daily.  . Blood Pressure KIT Use once or twice daily to monitor blood pressure  . hydrochlorothiazide (HYDRODIURIL) 12.5 MG tablet Take 1 tablet (12.5 mg total) by mouth daily. (Patient not taking: Reported on  08/04/2014)  . ondansetron (ZOFRAN ODT) 4 MG disintegrating tablet Take 1 tablet (4 mg total) by mouth every 8 (eight) hours as needed for nausea or vomiting.   No facility-administered encounter medications on file as of 08/04/2014.   A total of 40 minutes was spent with patient more than half of which was spent in counseling patient on the above mentioned issues , reviewing and explaining recent labs and imaging studies done, and coordination of care.    Orders Placed This Encounter  Procedures  . DG Abd 2 Views  . Comp Met (CMET)  . CBC with Differential/Platelet  . Lipase  . Ambulatory referral to Gastroenterology    No Follow-up on file.

## 2014-08-04 NOTE — Progress Notes (Signed)
Pre-visit discussion using our clinic review tool. No additional management support is needed unless otherwise documented below in the visit note.  

## 2014-08-05 ENCOUNTER — Ambulatory Visit (INDEPENDENT_AMBULATORY_CARE_PROVIDER_SITE_OTHER)
Admission: RE | Admit: 2014-08-05 | Discharge: 2014-08-05 | Disposition: A | Discharge status: Home / Self Care | Payer: Managed Care, Other (non HMO) | Source: Ambulatory Visit | Attending: Internal Medicine | Admitting: Internal Medicine

## 2014-08-05 ENCOUNTER — Encounter: Payer: Self-pay | Admitting: Internal Medicine

## 2014-08-05 DIAGNOSIS — K5900 Constipation, unspecified: Secondary | ICD-10-CM | POA: Diagnosis not present

## 2014-08-05 DIAGNOSIS — R195 Other fecal abnormalities: Secondary | ICD-10-CM | POA: Diagnosis not present

## 2014-08-05 DIAGNOSIS — R198 Other specified symptoms and signs involving the digestive system and abdomen: Secondary | ICD-10-CM | POA: Insufficient documentation

## 2014-08-05 DIAGNOSIS — R110 Nausea: Secondary | ICD-10-CM | POA: Diagnosis not present

## 2014-08-05 NOTE — Assessment & Plan Note (Signed)
Patient has had altered stool habits accompanied by nausea and abdominal distension for the past 5 weeks.  Plain films were unrevealing , labs were normal except  for a mild leukocytosis significant eosinophilia. (3.1 K  /microliter ). DDX includes  H PYlori gastritis, IBD, collagenous colitis. Will refer to GI for endoscopy  /colonoscopy,.

## 2014-08-05 NOTE — Assessment & Plan Note (Addendum)
She is not tender in the RUQ and liver enzymes are normal.  Bowel gas pattern is normal on plain films.    Lab Results  Component Value Date   ALT 22 08/04/2014   AST 23 08/04/2014   ALKPHOS 93 08/04/2014   BILITOT 0.7 08/04/2014   Lab Results  Component Value Date   LIPASE 20.0 08/04/2014

## 2014-08-13 ENCOUNTER — Encounter: Payer: Self-pay | Admitting: Internal Medicine

## 2014-08-19 ENCOUNTER — Encounter: Payer: Self-pay | Admitting: Internal Medicine

## 2015-06-19 ENCOUNTER — Encounter: Payer: Self-pay | Admitting: Internal Medicine

## 2015-06-23 NOTE — Telephone Encounter (Signed)
Patient requesting Colonoscopy referral . [facility] [address] [place], [place] [address] Main Phone: [phone] Fax: [phone]

## 2015-06-24 ENCOUNTER — Other Ambulatory Visit: Payer: Self-pay | Admitting: Internal Medicine

## 2015-06-24 DIAGNOSIS — Z1211 Encounter for screening for malignant neoplasm of colon: Secondary | ICD-10-CM

## 2015-06-25 ENCOUNTER — Encounter: Payer: Self-pay | Admitting: *Deleted

## 2015-07-15 ENCOUNTER — Encounter: Payer: Self-pay | Admitting: General Surgery

## 2015-07-15 ENCOUNTER — Ambulatory Visit (INDEPENDENT_AMBULATORY_CARE_PROVIDER_SITE_OTHER): Payer: Private Health Insurance - Indemnity | Admitting: General Surgery

## 2015-07-15 VITALS — BP 128/72 | HR 74 | Resp 12 | Ht 65.0 in | Wt 211.0 lb

## 2015-07-15 DIAGNOSIS — Z1211 Encounter for screening for malignant neoplasm of colon: Secondary | ICD-10-CM | POA: Diagnosis not present

## 2015-07-15 NOTE — Patient Instructions (Addendum)

## 2015-07-15 NOTE — Progress Notes (Signed)
Patient ID: Rhonda Brock, female   DOB: 22-Jul-1955, 60 y.o.   MRN: [id number]  Chief Complaint  Patient presents with  . Other    colonoscopy    HPI Rhonda Brock is a 60 y.o. female Here today for a evaluation of a screening colonoscopy. Last colonoscopy 06/08/2006. Patient states no GI problems .   The patient is a native of [place].  I personally reviewed the patient's history.  HPI  Past Medical History  Diagnosis Date  . GERD (gastroesophageal reflux disease)   . Chronic headaches   . Anxiety   . Cyst (solitary) of breast     bilaterally  . Back pain     Past Surgical History  Procedure Laterality Date  . Appendectomy  1962  . Tonsillectomy  1970  . Abdominal hysterectomy  2001  . Breast surgery  2004, 2005    left breast cyst drained x 2  . Breast cysts    . Colonoscopy  06/08/2006    Family History  Problem Relation Age of Onset  . Cancer Mother 27    Breast Cancer-Mastectomy, Also Abdominal cancer  . Asthma Father     Social History Social History  Substance Use Topics  . Smoking status: Never Smoker   . Smokeless tobacco: Never Used  . Alcohol Use: No    Allergies  Allergen Reactions  . Influenza Vaccines Shortness Of Breath    Current Outpatient Prescriptions  Medication Sig Dispense Refill  . Blood Pressure KIT Use once or twice daily to monitor blood pressure 1 each 0  . Cholecalciferol (VITAMIN D3) 5000 UNITS CAPS Take 1 capsule by mouth daily.     No current facility-administered medications for this visit.    Review of Systems Review of Systems  Constitutional: Negative.   Respiratory: Negative.   Cardiovascular: Negative.     Blood pressure 128/72, pulse 74, resp. rate 12, height _0  (1.651 m), weight 211 lb (95.709 kg).  Physical Exam Physical Exam  Constitutional: She is oriented to person, place, and time. She appears well-developed and well-nourished.  Eyes: Conjunctivae are normal. No scleral icterus.  Neck:  Neck supple.  Cardiovascular: Normal rate, regular rhythm and normal heart sounds.   Pulmonary/Chest: Effort normal and breath sounds normal.  Abdominal: Soft. Bowel sounds are normal. There is no tenderness.  Lymphadenopathy:    She has no cervical adenopathy.  Neurological: She is alert and oriented to person, place, and time.  Skin: Skin is warm and dry.    Data Reviewed Letter from Santa Lighter, M.D. dated 06/16/2006 reports hyperplastic polyps removed.  Assessment    History hyperplastic polyps in 2008.    Plan        Patient due in April 2018 for screening colonoscopy. This patient will not require a pre-op visit. She will be contacted when April 2018 schedule is available and date will be arranged accordingly. Miralax prescription will be sent in once date arranged. Patient given a copy of colonoscopy instructions today.   PCP:  Deborra Medina  This information has been scribed by Gaspar Cola CMA.    Robert Bellow 07/16/2015, 5:30 PM

## 2015-07-16 DIAGNOSIS — Z1211 Encounter for screening for malignant neoplasm of colon: Secondary | ICD-10-CM | POA: Insufficient documentation

## 2015-07-16 DIAGNOSIS — Z0000 Encounter for general adult medical examination without abnormal findings: Secondary | ICD-10-CM | POA: Insufficient documentation

## 2016-01-25 ENCOUNTER — Encounter: Payer: Self-pay | Admitting: Internal Medicine

## 2016-04-20 ENCOUNTER — Telehealth: Payer: Self-pay | Admitting: *Deleted

## 2016-04-20 MED ORDER — POLYETHYLENE GLYCOL 3350 17 GM/SCOOP PO POWD: ORAL | 0 refills | Status: DC

## 2016-04-20 NOTE — Telephone Encounter (Signed)
Patient was contacted today and colonoscopy was arranged for 06-29-16 at [facility].   Miralax prescription was sent in to the patient's pharmacy today.   Colonoscopy instructions have been mailed to the patient.

## 2016-04-22 ENCOUNTER — Telehealth: Payer: Self-pay | Admitting: General Surgery

## 2016-04-22 NOTE — Telephone Encounter (Signed)
04-22-16 PATIENT CALLED TO LET us KNOW THEY ARE WORKING ON HER INS([employer]) IT SHOULD BE IN EFFECT NEXT WEEK.[employer] HAS JUST RECEIVED THE INFORMATION.PLEASE CALL WITH ANY QUESTIONS [phone]

## 2016-05-09 ENCOUNTER — Encounter: Payer: Self-pay | Admitting: Family

## 2016-05-09 ENCOUNTER — Ambulatory Visit (INDEPENDENT_AMBULATORY_CARE_PROVIDER_SITE_OTHER): Payer: Private Health Insurance - Indemnity | Admitting: Family

## 2016-05-09 VITALS — BP 155/90 | HR 84 | Temp 98.2°F | Ht 65.0 in | Wt 210.4 lb

## 2016-05-09 DIAGNOSIS — R030 Elevated blood-pressure reading, without diagnosis of hypertension: Secondary | ICD-10-CM

## 2016-05-09 DIAGNOSIS — R079 Chest pain, unspecified: Secondary | ICD-10-CM

## 2016-05-09 MED ORDER — LISINOPRIL 5 MG PO TABS: 5.0000 mg | ORAL_TABLET | Freq: Every day | ORAL | 3 refills | Status: DC

## 2016-05-09 NOTE — Progress Notes (Signed)
Subjective:    Patient ID: Rhonda Brock, female    DOB: [birth year], 61 y.o.   MRN: [id number]  CC: Rhonda Brock is a 61 y.o. female who presents today for an acute visit.    HPI: CC: 'heart fluttering' for past several weeks, unchanged. Intermittent, last for couple of minutes and goes away on its own. Happens daily, usually Notices it when still or laying down to go to sleep. Currently not having symptom. No CP, SOB, left arm pain. Worse of late of close friend who passed away from CVD few days ago. Hasn't seen cardiology.   Plenty of water, no caffeine  Walk3x week , also stretching/ balance. No CP with exercise.  Worried about elderly husband.    BP Log at home shows 115/78 on average; one value of 145/93.    Colonoscopy scheduled 05/2016; due for mammogram      HISTORY:  Past Medical History:  Diagnosis Date  . Anxiety   . Back pain   . Chronic headaches   . Cyst (solitary) of breast    bilaterally  . GERD (gastroesophageal reflux disease)    Past Surgical History:  Procedure Laterality Date  . ABDOMINAL HYSTERECTOMY  2001  . APPENDECTOMY  1962  . breast cysts    . BREAST SURGERY  2004, 2005   left breast cyst drained x 2  . COLONOSCOPY  06/08/2006  . TONSILLECTOMY  1970   Family History  Problem Relation Age of Onset  . Cancer Mother 60    Breast Cancer-Mastectomy, Also Abdominal cancer  . Asthma Father     Allergies: Influenza vaccines Current Outpatient Prescriptions on File Prior to Visit  Medication Sig Dispense Refill  . Blood Pressure KIT Use once or twice daily to monitor blood pressure 1 each 0  . Cholecalciferol (VITAMIN D3) 5000 UNITS CAPS Take 1 capsule by mouth daily.    . polyethylene glycol powder (GLYCOLAX/MIRALAX) powder 255 grams one bottle for colonoscopy prep 255 g 0   No current facility-administered medications on file prior to visit.     Social History  Substance Use Topics  . Smoking status: Never Smoker  . Smokeless  tobacco: Never Used  . Alcohol use No    Review of Systems  Constitutional: Negative for chills and fever.  Eyes: Negative for visual disturbance.  Respiratory: Negative for cough and shortness of breath.   Cardiovascular: Positive for palpitations. Negative for chest pain and leg swelling.  Gastrointestinal: Negative for nausea and vomiting.  Neurological: Negative for dizziness and headaches.      Objective:    BP (!) 155/90   Pulse 84   Temp 98.2 F (36.8 C) (Oral)   Ht 5' 5" (1.651 m)   Wt 210 lb 6.4 oz (95.4 kg)   SpO2 98%   BMI 35.01 kg/m    Physical Exam  Constitutional: She appears well-developed and well-nourished.  Eyes: Conjunctivae are normal.  Cardiovascular: Normal rate, regular rhythm, normal heart sounds and normal pulses.   Pulmonary/Chest: Effort normal and breath sounds normal. She has no wheezes. She has no rhonchi. She has no rales.  Neurological: She is alert.  Skin: Skin is warm and dry.  Psychiatric: She has a normal mood and affect. Her speech is normal and behavior is normal. Thought content normal.  Vitals reviewed.      Assessment & Plan:   Problem List Items Addressed This Visit      Other   Blood pressure elevated without history of HTN -  Primary    Elevated today. Palpitations. No today. Start low dose ACE-I for HTN. Pending CMP, mag, tsh. Reassured by normal EKG. I offered a referral cardiology for Holter monitor, stress test the patient politely declined at this time. She would like to watch symptoms closely and let me know if persists. F/u one month      Relevant Medications   lisinopril (PRINIVIL,ZESTRIL) 5 MG tablet   Other Relevant Orders   EKG 12-Lead (Completed)   Comprehensive metabolic panel   TSH   Magnesium   Comprehensive metabolic panel    Other Visit Diagnoses    Chest pain, unspecified type             I am having [name] start on lisinopril. I am also having her maintain her Vitamin D3, Blood  Pressure, and polyethylene glycol powder.   Meds ordered this encounter  Medications  . lisinopril (PRINIVIL,ZESTRIL) 5 MG tablet    Sig: Take 1 tablet (5 mg total) by mouth daily.    Dispense:  90 tablet    Refill:  3    Order Specific Question:   Supervising Provider    Answer:   Crecencio Mc [[id number]]    Return precautions given.   Risks, benefits, and alternatives of the medications and treatment plan prescribed today were discussed, and patient expressed understanding.   Education regarding symptom management and diagnosis given to patient on AVS.  Continue to follow with TULLO, Aris Everts, MD for routine health maintenance.   Rhonda Brock and I agreed with plan.   Mable Paris, FNP

## 2016-05-09 NOTE — Progress Notes (Signed)
Pre visit review using our clinic review tool, if applicable. No additional management support is needed unless otherwise documented below in the visit note. 

## 2016-05-09 NOTE — Assessment & Plan Note (Addendum)
Elevated today. Palpitations. Non today. Start low dose ACE-I for HTN. Pending CMP, mag, tsh. Reassured by normal EKG. I offered a referral cardiology for Holter monitor, stress test the patient politely declined at this time. She would like to watch symptoms closely and let me know if persists. F/u one month

## 2016-05-09 NOTE — Patient Instructions (Addendum)
Appears you are due for your mammogram; please ensure you have.                          Let me know if symptom recurs, as discussed, and we will make a referral to cardiology.  If there is no improvement in your symptoms, or if there is any worsening of symptoms, or if you have any additional concerns, please return for re-evaluation; or, if we are closed, consider going to the Emergency Room for evaluation if symptoms urgent.

## 2016-05-10 ENCOUNTER — Other Ambulatory Visit (INDEPENDENT_AMBULATORY_CARE_PROVIDER_SITE_OTHER): Payer: Private Health Insurance - Indemnity

## 2016-05-10 DIAGNOSIS — R030 Elevated blood-pressure reading, without diagnosis of hypertension: Secondary | ICD-10-CM | POA: Diagnosis not present

## 2016-05-10 LAB — COMPREHENSIVE METABOLIC PANEL
ALT: 19 U/L (ref 0–35)
AST: 19 U/L (ref 0–37)
Albumin: 4.4 g/dL (ref 3.5–5.2)
Alkaline Phosphatase: 66 U/L (ref 39–117)
BILIRUBIN TOTAL: 0.9 mg/dL (ref 0.2–1.2)
BUN: 17 mg/dL (ref 6–23)
CO2: 31 meq/L (ref 19–32)
Calcium: 9.6 mg/dL (ref 8.4–10.5)
Chloride: 105 mEq/L (ref 96–112)
Creatinine, Ser: 0.79 mg/dL (ref 0.40–1.20)
GFR: 78.64 mL/min (ref >60.00)
Glucose, Bld: 154 mg/dL — ABNORMAL HIGH (ref 70–99)
POTASSIUM: 4 meq/L (ref 3.5–5.1)
Sodium: 141 mEq/L (ref 135–145)
TOTAL PROTEIN: 6.9 g/dL (ref 6.0–8.3)

## 2016-05-10 LAB — TSH: TSH: 1.41 u[IU]/mL (ref 0.35–4.50)

## 2016-05-10 LAB — MAGNESIUM: Magnesium: 2.1 mg/dL (ref 1.5–2.5)

## 2016-05-10 NOTE — Addendum Note (Signed)
Addended by: Leeanne Rio on: 05/10/2016 01:59 PM   Modules accepted: Orders

## 2016-05-26 ENCOUNTER — Encounter: Payer: Self-pay | Admitting: Family

## 2016-05-26 ENCOUNTER — Ambulatory Visit (INDEPENDENT_AMBULATORY_CARE_PROVIDER_SITE_OTHER): Payer: Private Health Insurance - Indemnity | Admitting: Family

## 2016-05-26 VITALS — BP 145/85 | HR 69 | Temp 98.5°F | Ht 65.0 in | Wt 208.8 lb

## 2016-05-26 DIAGNOSIS — R030 Elevated blood-pressure reading, without diagnosis of hypertension: Secondary | ICD-10-CM

## 2016-05-26 LAB — BASIC METABOLIC PANEL
BUN: 16 mg/dL (ref 6–23)
CO2: 28 mEq/L (ref 19–32)
Calcium: 9.7 mg/dL (ref 8.4–10.5)
Chloride: 104 mEq/L (ref 96–112)
Creatinine, Ser: 0.91 mg/dL (ref 0.40–1.20)
GFR: 66.79 mL/min (ref >60.00)
GLUCOSE: 100 mg/dL — AB (ref 70–99)
Potassium: 4.5 mEq/L (ref 3.5–5.1)
SODIUM: 138 meq/L (ref 135–145)

## 2016-05-26 MED ORDER — LISINOPRIL 10 MG PO TABS: 10.0000 mg | ORAL_TABLET | Freq: Every day | ORAL | 0 refills | Status: DC

## 2016-05-26 NOTE — Assessment & Plan Note (Signed)
Increase lisinopril. No CP, SOB, palpitations. Blood pressure not at goal. Pending BMP. We'll also recheck BMP next week.

## 2016-05-26 NOTE — Patient Instructions (Signed)
Labs today Lab appointment again next weds or thurs, non fasting  Monitor BP; goal < 140/90  Increase lisinopril   Managing Your Hypertension Hypertension is commonly called high blood pressure. This is when the force of your blood pressing against the walls of your arteries is too strong. Arteries are blood vessels that carry blood from your heart throughout your body. Hypertension forces the heart to work harder to pump blood, and may cause the arteries to become narrow or stiff. Having untreated or uncontrolled hypertension can cause heart attack, stroke, kidney disease, and other problems. What are blood pressure readings? A blood pressure reading consists of a higher number over a lower number. Ideally, your blood pressure should be below 120/80. The first ("top") number is called the systolic pressure. It is a measure of the pressure in your arteries as your heart beats. The second ("bottom") number is called the diastolic pressure. It is a measure of the pressure in your arteries as the heart relaxes. What does my blood pressure reading mean? Blood pressure is classified into four stages. Based on your blood pressure reading, your health care provider may use the following stages to determine what type of treatment you need, if any. Systolic pressure and diastolic pressure are measured in a unit called mm Hg. Normal   Systolic pressure: below 123456.  Diastolic pressure: below 80. Elevated   Systolic pressure: Q000111Q.  Diastolic pressure: below 80. Hypertension stage 1     Diastolic pressure: [id number]. Hypertension stage 2   Systolic pressure: XX123456 or above.  Diastolic pressure: 90 or above. What health risks are associated with hypertension? Managing your hypertension is an important responsibility. Uncontrolled hypertension can lead to:  A heart attack.  A stroke.  A weakened blood vessel (aneurysm).  Heart failure.  Kidney damage.  Eye damage.  Metabolic  syndrome.  Memory and concentration problems. What changes can I make to manage my hypertension? Eating and drinking   Eat a diet that is high in fiber and potassium, and low in salt (sodium), added sugar, and fat. An example eating plan is called the DASH (Dietary Approaches to Stop Hypertension) diet. To eat this way:  Eat plenty of fresh fruits and vegetables. Try to fill half of your plate at each meal with fruits and vegetables.  Eat whole grains, such as whole wheat pasta, brown rice, or whole grain bread. Fill about one quarter of your plate with whole grains.  Eat low-fat diary products.  Avoid fatty cuts of meat, processed or cured meats, and poultry with skin. Fill about one quarter of your plate with lean proteins such as fish, chicken without skin, beans, eggs, and tofu.  Avoid premade and processed foods. These tend to be higher in sodium, added sugar, and fat.     Lifestyle   Work with your health care provider to maintain a healthy body weight, or to lose weight. Ask what an ideal weight is for you.  Get at least 30 minutes of exercise that causes your heart to beat faster (aerobic exercise) most days of the week. Activities may include walking, swimming, or biking.       Monitoring   Monitor your blood pressure at home as told by your health care provider. Your personal target blood pressure may vary depending on your medical conditions, your age, and other factors.  Have your blood pressure checked regularly, as often as told by your health care provider. Working with your health care provider   Review all  the medicines you take with your health care provider because there may be side effects or interactions.  Talk with your health care provider about your diet, exercise habits, and other lifestyle factors that may be contributing to hypertension.  Visit your health care provider regularly. Your health care provider can help you create and adjust your plan  for managing hypertension. Will I need medicine to control my blood pressure? Your health care provider may prescribe medicine if lifestyle changes are not enough to get your blood pressure under control, and if:  Your systolic blood pressure is 130 or higher.  Your diastolic blood pressure is 80 or higher. Take medicines only as told by your health care provider. Follow the directions carefully. Blood pressure medicines must be taken as prescribed. The medicine does not work as well when you skip doses. Skipping doses also puts you at risk for problems. Contact a health care provider if:  You think you are having a reaction to medicines you have taken.  You have repeated (recurrent) headaches.  You feel dizzy.  You have swelling in your ankles.  You have trouble with your vision. Get help right away if:  You develop a severe headache or confusion.  You have unusual weakness or numbness, or you feel faint.  You have severe pain in your chest or abdomen.  You vomit repeatedly.  You have trouble breathing. Summary  Hypertension is when the force of blood pumping through your arteries is too strong. If this condition is not controlled, it may put you at risk for serious complications.  Your personal target blood pressure may vary depending on your medical conditions, your age, and other factors. For most people, a normal blood pressure is less than 120/80.  Hypertension is managed by lifestyle changes, medicines, or both. Lifestyle changes include weight loss, eating a healthy, low-sodium diet, exercising more, and limiting alcohol. This information is not intended to replace advice given to you by your health care provider. Make sure you discuss any questions you have with your health care provider. Document Released: 12/07/2011 Document Revised: 02/10/2016 Document Reviewed: 02/10/2016 Elsevier Interactive Patient Education  2017 [employer].

## 2016-05-26 NOTE — Progress Notes (Signed)
Pre visit review using our clinic review tool, if applicable. No additional management support is needed unless otherwise documented below in the visit note. 

## 2016-05-26 NOTE — Progress Notes (Signed)
Subjective:    Patient ID: Rhonda Brock, female    DOB: 1955/04/10, 61 y.o.   MRN: [id number]  CC: Rhonda Brock is a 61 y.o. female who presents today for follow up.   HPI: Doing well. Compliant with medication.  Palpitations have resolved. Denies exertional chest pain or pressure, numbness or tingling radiating to left arm or jaw, dizziness, frequent headaches, changes in vision, or shortness of breath.      HISTORY:  Past Medical History:  Diagnosis Date  . Anxiety   . Back pain   . Chronic headaches   . Cyst (solitary) of breast    bilaterally  . GERD (gastroesophageal reflux disease)    Past Surgical History:  Procedure Laterality Date  . ABDOMINAL HYSTERECTOMY  2001  . APPENDECTOMY  1962  . breast cysts    . BREAST SURGERY  2004, 2005   left breast cyst drained x 2  . COLONOSCOPY  06/08/2006  . TONSILLECTOMY  1970   Family History  Problem Relation Age of Onset  . Cancer Mother 74    Breast Cancer-Mastectomy, Also Abdominal cancer  . Asthma Father     Allergies: Influenza vaccines Current Outpatient Prescriptions on File Prior to Visit  Medication Sig Dispense Refill  . Blood Pressure KIT Use once or twice daily to monitor blood pressure 1 each 0  . Cholecalciferol (VITAMIN D3) 5000 UNITS CAPS Take 1 capsule by mouth daily.    . polyethylene glycol powder (GLYCOLAX/MIRALAX) powder 255 grams one bottle for colonoscopy prep 255 g 0   No current facility-administered medications on file prior to visit.     Social History  Substance Use Topics  . Smoking status: Never Smoker  . Smokeless tobacco: Never Used  . Alcohol use No    Review of Systems  Constitutional: Negative for chills and fever.  Eyes: Negative for visual disturbance.  Respiratory: Negative for cough.   Cardiovascular: Negative for chest pain and palpitations.  Gastrointestinal: Negative for abdominal pain, nausea and vomiting.  Musculoskeletal: Negative for arthralgias and myalgias.   Skin: Negative for rash.  Neurological: Negative for headaches.  Hematological: Negative for adenopathy.      Objective:    BP (!) 145/85   Pulse 69   Temp 98.5 F (36.9 C) (Oral)   Ht '5\' 5"'  (1.651 m)   Wt 208 lb 12.8 oz (94.7 kg)   SpO2 96%   BMI 34.75 kg/m  BP Readings from Last 3 Encounters:  05/26/16 (!) 145/85  05/09/16 (!) 155/90  07/15/15 128/72   Wt Readings from Last 3 Encounters:  05/26/16 208 lb 12.8 oz (94.7 kg)  05/09/16 210 lb 6.4 oz (95.4 kg)  07/15/15 211 lb (95.7 kg)    Physical Exam  Constitutional: She appears well-developed and well-nourished.  Eyes: Conjunctivae are normal.  Cardiovascular: Normal rate, regular rhythm, normal heart sounds and normal pulses.   Pulmonary/Chest: Effort normal and breath sounds normal. She has no wheezes. She has no rhonchi. She has no rales.  Neurological: She is alert.  Skin: Skin is warm and dry.  Psychiatric: She has a normal mood and affect. Her speech is normal and behavior is normal. Thought content normal.  Vitals reviewed.      Assessment & Plan:   Problem List Items Addressed This Visit      Other   Blood pressure elevated without history of HTN - Primary    Increase lisinopril. No CP, SOB, palpitations. Blood pressure not at goal. Pending BMP. We'll  also recheck BMP next week.      Relevant Medications   lisinopril (PRINIVIL,ZESTRIL) 10 MG tablet   Other Relevant Orders   Basic metabolic panel   Basic metabolic panel       I have changed Ms. Soots's lisinopril. I am also having her maintain her Vitamin D3, Blood Pressure, and polyethylene glycol powder.   Meds ordered this encounter  Medications  . lisinopril (PRINIVIL,ZESTRIL) 10 MG tablet    Sig: Take 1 tablet (10 mg total) by mouth daily.    Dispense:  90 tablet    Refill:  0    Order Specific Question:   Supervising Provider    Answer:   Crecencio Mc [[id number]]    Return precautions given.   Risks, benefits, and alternatives  of the medications and treatment plan prescribed today were discussed, and patient expressed understanding.   Education regarding symptom management and diagnosis given to patient on AVS.  Continue to follow with TULLO, Aris Everts, MD for routine health maintenance.   Rhonda Brock and I agreed with plan.   Mable Paris, FNP

## 2016-05-27 ENCOUNTER — Encounter: Payer: Self-pay | Admitting: Internal Medicine

## 2016-05-27 ENCOUNTER — Encounter: Payer: Self-pay | Admitting: Family

## 2016-06-01 ENCOUNTER — Encounter: Payer: Self-pay | Admitting: Internal Medicine

## 2016-06-01 ENCOUNTER — Encounter: Payer: Self-pay | Admitting: Family

## 2016-06-02 ENCOUNTER — Other Ambulatory Visit (INDEPENDENT_AMBULATORY_CARE_PROVIDER_SITE_OTHER): Payer: Private Health Insurance - Indemnity

## 2016-06-02 ENCOUNTER — Other Ambulatory Visit: Payer: Self-pay

## 2016-06-02 ENCOUNTER — Other Ambulatory Visit: Payer: Self-pay | Admitting: Family

## 2016-06-02 DIAGNOSIS — R030 Elevated blood-pressure reading, without diagnosis of hypertension: Secondary | ICD-10-CM

## 2016-06-02 LAB — BASIC METABOLIC PANEL
BUN: 16 mg/dL (ref 6–23)
CO2: 30 mEq/L (ref 19–32)
Calcium: 9.6 mg/dL (ref 8.4–10.5)
Chloride: 105 mEq/L (ref 96–112)
Creatinine, Ser: 0.98 mg/dL (ref 0.40–1.20)
GFR: 61.31 mL/min (ref >60.00)
GLUCOSE: 99 mg/dL (ref 70–99)
Potassium: 4.1 mEq/L (ref 3.5–5.1)
Sodium: 140 mEq/L (ref 135–145)

## 2016-06-02 MED ORDER — LISINOPRIL 10 MG PO TABS: 10.0000 mg | ORAL_TABLET | Freq: Every day | ORAL | 0 refills | Status: DC

## 2016-06-02 NOTE — Progress Notes (Unsigned)
Call pt-  10mg  sent to [facility]  Needs to follow with PCP, tullo  Remind pt to check labs one week. Make an appt

## 2016-06-02 NOTE — Telephone Encounter (Signed)
lisinopril 10 mg was sent today by Joycelyn Schmid.  Regards,   Deborra Medina, MD

## 2016-06-02 NOTE — Progress Notes (Signed)
LMTCB

## 2016-06-02 NOTE — Progress Notes (Signed)
Pt would like medication sent to [facility] on [address] instead of [place]. Resent rx. Scheduled pt an appt with Dr. Derrel Nip on 06/13/2016 @ 4:30pm. Pt also stated she is coming in today for her lab work.

## 2016-06-13 ENCOUNTER — Encounter: Payer: Self-pay | Admitting: Internal Medicine

## 2016-06-13 ENCOUNTER — Ambulatory Visit (INDEPENDENT_AMBULATORY_CARE_PROVIDER_SITE_OTHER): Payer: Private Health Insurance - Indemnity | Admitting: Internal Medicine

## 2016-06-13 VITALS — BP 118/82 | HR 69 | Resp 15 | Ht 65.0 in | Wt 210.4 lb

## 2016-06-13 DIAGNOSIS — I10 Essential (primary) hypertension: Secondary | ICD-10-CM | POA: Diagnosis not present

## 2016-06-13 DIAGNOSIS — E669 Obesity, unspecified: Secondary | ICD-10-CM | POA: Diagnosis not present

## 2016-06-13 DIAGNOSIS — R5383 Other fatigue: Secondary | ICD-10-CM

## 2016-06-13 DIAGNOSIS — E781 Pure hyperglyceridemia: Secondary | ICD-10-CM | POA: Diagnosis not present

## 2016-06-13 NOTE — Progress Notes (Signed)
Subjective:  Patient ID: Rhonda Brock, female    DOB: 1955-07-26  Age: 61 y.o. MRN: [id number]  CC: The primary encounter diagnosis was Hypertriglyceridemia. Diagnoses of Fatigue, unspecified type, Hypertension, unspecified type, Hypertension, essential, Obesity (BMI 30-39.9), and Caregiver with fatigue were also pertinent to this visit.  HPI Rhonda Brock presents for follow up on hypertension .  Home readings confirm control  On lisinopril.  She denies any cough but reports persistent mild nausea.  Outpatient Medications Prior to Visit  Medication Sig Dispense Refill  . Blood Pressure KIT Use once or twice daily to monitor blood pressure 1 each 0  . lisinopril (PRINIVIL,ZESTRIL) 10 MG tablet Take 1 tablet (10 mg total) by mouth daily. 90 tablet 0  . polyethylene glycol powder (GLYCOLAX/MIRALAX) powder 255 grams one bottle for colonoscopy prep 255 g 0  . Cholecalciferol (VITAMIN D3) 5000 UNITS CAPS Take 1 capsule by mouth daily.     No facility-administered medications prior to visit.     Review of Systems;  Patient denies headache, fevers, malaise, unintentional weight loss, skin rash, eye pain, sinus congestion and sinus pain, sore throat, dysphagia,  hemoptysis , cough, dyspnea, wheezing, chest pain, palpitations, orthopnea, edema, abdominal pain,  melena, diarrhea, constipation, flank pain, dysuria, hematuria, urinary  Frequency, nocturia, numbness, tingling, seizures,  Focal weakness, Loss of consciousness,  Tremor, insomnia, depression, anxiety, and suicidal ideation.      Objective:  BP 118/82 (BP Location: Left Arm, Patient Position: Sitting, Cuff Size: Large)   Pulse 69   Resp 15   Ht 5' 5" (1.651 m)   Wt 210 lb 6.4 oz (95.4 kg)   SpO2 98%   BMI 35.01 kg/m   BP Readings from Last 3 Encounters:  06/13/16 118/82  05/26/16 (!) 145/85  05/09/16 (!) 155/90    Wt Readings from Last 3 Encounters:  06/13/16 210 lb 6.4 oz (95.4 kg)  05/26/16 208 lb 12.8 oz (94.7 kg)    05/09/16 210 lb 6.4 oz (95.4 kg)    General appearance: alert, cooperative and appears stated age Ears: normal TM's and external ear canals both ears Throat: lips, mucosa, and tongue normal; teeth and gums normal Neck: no adenopathy, no carotid bruit, supple, symmetrical, trachea midline and thyroid not enlarged, symmetric, no tenderness/mass/nodules Back: symmetric, no curvature. ROM normal. No CVA tenderness. Lungs: clear to auscultation bilaterally Heart: regular rate and rhythm, S1, S2 normal, no murmur, click, rub or gallop Abdomen: soft, non-tender; bowel sounds normal; no masses,  no organomegaly Pulses: 2+ and symmetric Skin: Skin color, texture, turgor normal. No rashes or lesions Lymph nodes: Cervical, supraclavicular, and axillary nodes normal.  No results found for: HGBA1C  Lab Results  Component Value Date   CREATININE 0.98 06/02/2016   CREATININE 0.91 05/26/2016   CREATININE 0.79 05/10/2016    Lab Results  Component Value Date   WBC 13.4 (H) 08/04/2014   HGB 13.7 08/04/2014   HCT 40.2 08/04/2014   PLT 277.0 08/04/2014   GLUCOSE 99 06/02/2016   CHOL 187 06/30/2014   TRIG 231.0 (H) 06/30/2014   HDL 44.90 06/30/2014   LDLDIRECT 119.0 06/30/2014   ALT 19 05/10/2016   AST 19 05/10/2016   NA 140 06/02/2016   K 4.1 06/02/2016   CL 105 06/02/2016   CREATININE 0.98 06/02/2016   BUN 16 06/02/2016   CO2 30 06/02/2016   TSH 1.41 05/10/2016    Dg Abd 2 Views  Result Date: 08/05/2014 CLINICAL DATA:  Constipation, postprandial nausea, vomiting, loose stools  EXAM: ABDOMEN - 2 VIEW COMPARISON:  None. FINDINGS: The bowel gas pattern is normal. There is no evidence of free air. No radio-opaque calculi or other significant radiographic abnormality is seen. Pelvic phleboliths are noted IMPRESSION: Negative. Electronically Signed   By: Lahoma Crocker M.D.   On: 08/05/2014 13:07    Assessment & Plan:   Problem List Items Addressed This Visit    Caregiver with fatigue     Discussed her current level of fatigue and ways to cope with the increased responsibility of caring for husband who has been diagnosed with AD      Hypertension, essential    Well controlled on current regimen. Renal function stable, no changes today.  Lab Results  Component Value Date   CREATININE 0.98 06/02/2016   Lab Results  Component Value Date   NA 140 06/02/2016   K 4.1 06/02/2016   CL 105 06/02/2016   CO2 30 06/02/2016         Hypertriglyceridemia - Primary    Mild, with normal LDL and HDL. Marland Kitchen  She is overdue for repeat testing.   Lab Results  Component Value Date   CHOL 187 06/30/2014   HDL 44.90 06/30/2014   LDLDIRECT 119.0 06/30/2014   TRIG 231.0 (H) 06/30/2014   CHOLHDL 4 06/30/2014         Relevant Orders   Lipid panel   Obesity (BMI 30-39.9)    I have addressed  BMI and recommended a low glycemic index diet utilizing smaller more frequent meals to increase metabolism.  I have also recommended that patient start exercising with a goal of 30 minutes of aerobic exercise a minimum of 5 days per week.        Other Visit Diagnoses    Fatigue, unspecified type       Relevant Orders   TSH   CBC with Differential/Platelet   Hypertension, unspecified type       Relevant Orders   Comprehensive metabolic panel      I have discontinued Ms. Rhonda Brock's Vitamin D3. I am also having her maintain her Blood Pressure, polyethylene glycol powder, and lisinopril.  No orders of the defined types were placed in this encounter.   Medications Discontinued During This Encounter  Medication Reason  . Cholecalciferol (VITAMIN D3) 5000 UNITS CAPS Patient has not taken in last 30 days    Follow-up: Return in about 6 months (around 12/14/2016) for fasting labs prior to visit .   Crecencio Mc, MD

## 2016-06-13 NOTE — Progress Notes (Signed)
Pre visit review using our clinic review tool, if applicable. No additional management support is needed unless otherwise documented below in the visit note. 

## 2016-06-13 NOTE — Patient Instructions (Addendum)
Your BP is well controlled.    No dose change,  Just try taking lisinopril  In the evening at bedtime   Increase your walking  To three times weekly as often as you can

## 2016-06-14 DIAGNOSIS — F4321 Adjustment disorder with depressed mood: Secondary | ICD-10-CM | POA: Insufficient documentation

## 2016-06-14 DIAGNOSIS — R5383 Other fatigue: Secondary | ICD-10-CM | POA: Insufficient documentation

## 2016-06-14 NOTE — Assessment & Plan Note (Addendum)
Mild, with normal LDL and HDL. .  She is overdue for repeat testing.   Lab Results  Component Value Date   CHOL 187 06/30/2014   HDL 44.90 06/30/2014   LDLDIRECT 119.0 06/30/2014   TRIG 231.0 (H) 06/30/2014   CHOLHDL 4 06/30/2014     

## 2016-06-14 NOTE — Assessment & Plan Note (Signed)
Well controlled on current regimen. Renal function stable, no changes today.  Lab Results  Component Value Date   CREATININE 0.98 06/02/2016   Lab Results  Component Value Date   NA 140 06/02/2016   K 4.1 06/02/2016   CL 105 06/02/2016   CO2 30 06/02/2016

## 2016-06-14 NOTE — Assessment & Plan Note (Addendum)
Discussed her current level of fatigue and ways to cope with the increased responsibility of caring for husband who has been diagnosed with AD

## 2016-06-14 NOTE — Assessment & Plan Note (Addendum)
I have addressed  BMI and recommended a low glycemic index diet utilizing smaller more frequent meals to increase metabolism.  I have also recommended that patient start exercising with a goal of 30 minutes of aerobic exercise a minimum of 5 days per week.  

## 2016-06-17 ENCOUNTER — Encounter: Payer: Self-pay | Admitting: Internal Medicine

## 2016-06-22 ENCOUNTER — Encounter: Payer: Self-pay | Admitting: *Deleted

## 2016-06-22 LAB — HM MAMMOGRAPHY

## 2016-06-23 ENCOUNTER — Other Ambulatory Visit: Payer: Self-pay | Admitting: General Surgery

## 2016-06-23 DIAGNOSIS — Z1211 Encounter for screening for malignant neoplasm of colon: Secondary | ICD-10-CM

## 2016-06-29 ENCOUNTER — Ambulatory Visit
Admission: RE | Admit: 2016-06-29 | Discharge: 2016-06-29 | Disposition: A | Discharge status: Home / Self Care | Payer: 59 | Source: Ambulatory Visit | Attending: General Surgery | Admitting: General Surgery

## 2016-06-29 ENCOUNTER — Ambulatory Visit: Payer: 59 | Admitting: Certified Registered Nurse Anesthetist

## 2016-06-29 ENCOUNTER — Encounter
Admission: RE | Disposition: A | Discharge status: Home / Self Care | Payer: Self-pay | Source: Ambulatory Visit | Attending: General Surgery

## 2016-06-29 ENCOUNTER — Encounter: Payer: Self-pay | Admitting: *Deleted

## 2016-06-29 DIAGNOSIS — F419 Anxiety disorder, unspecified: Secondary | ICD-10-CM | POA: Insufficient documentation

## 2016-06-29 DIAGNOSIS — I10 Essential (primary) hypertension: Secondary | ICD-10-CM | POA: Diagnosis not present

## 2016-06-29 DIAGNOSIS — R51 Headache: Secondary | ICD-10-CM | POA: Diagnosis not present

## 2016-06-29 DIAGNOSIS — Z1211 Encounter for screening for malignant neoplasm of colon: Secondary | ICD-10-CM

## 2016-06-29 DIAGNOSIS — K219 Gastro-esophageal reflux disease without esophagitis: Secondary | ICD-10-CM | POA: Insufficient documentation

## 2016-06-29 DIAGNOSIS — Z79899 Other long term (current) drug therapy: Secondary | ICD-10-CM | POA: Diagnosis not present

## 2016-06-29 HISTORY — PX: COLONOSCOPY WITH PROPOFOL: SHX5780

## 2016-06-29 HISTORY — DX: Essential (primary) hypertension: I10

## 2016-06-29 SURGERY — COLONOSCOPY WITH PROPOFOL
Anesthesia: General

## 2016-06-29 MED ORDER — PROPOFOL 10 MG/ML IV BOLUS
INTRAVENOUS | Status: DC | PRN
  Administered 2016-06-29: 60 mg via INTRAVENOUS
  Administered 2016-06-29: 20 mg via INTRAVENOUS

## 2016-06-29 MED ORDER — SODIUM CHLORIDE 0.9 % IV SOLN
INTRAVENOUS | Status: DC
  Administered 2016-06-29: 1000 mL via INTRAVENOUS

## 2016-06-29 MED ORDER — PROPOFOL 500 MG/50ML IV EMUL
INTRAVENOUS | Status: AC
  Filled 2016-06-29: qty 50

## 2016-06-29 MED ORDER — PROPOFOL 500 MG/50ML IV EMUL
INTRAVENOUS | Status: DC | PRN
  Administered 2016-06-29: 150 ug/kg/min via INTRAVENOUS

## 2016-06-29 MED ORDER — PROPOFOL 10 MG/ML IV BOLUS
INTRAVENOUS | Status: AC
  Filled 2016-06-29: qty 20

## 2016-06-29 MED ORDER — GLYCOPYRROLATE 0.2 MG/ML IJ SOLN
INTRAMUSCULAR | Status: AC
  Filled 2016-06-29: qty 1

## 2016-06-29 MED ORDER — SODIUM CHLORIDE 0.9 % IJ SOLN
INTRAMUSCULAR | Status: AC
  Filled 2016-06-29: qty 10

## 2016-06-29 MED ORDER — EPHEDRINE SULFATE 50 MG/ML IJ SOLN
INTRAMUSCULAR | Status: DC | PRN
  Administered 2016-06-29: 5 mg via INTRAVENOUS
  Administered 2016-06-29: 10 mg via INTRAVENOUS

## 2016-06-29 NOTE — H&P (Signed)
Rhonda Brock [id number] 13-Aug-1955     HPI: Patient for screening colonoscopy.  RX for HTN since last year's visit. Tolerated prep fairly well, small emesis with last cup.    Prescriptions Prior to Admission  Medication Sig Dispense Refill Last Dose  . Blood Pressure KIT Use once or twice daily to monitor blood pressure 1 each 0 Taking  . lisinopril (PRINIVIL,ZESTRIL) 10 MG tablet Take 1 tablet (10 mg total) by mouth daily. 90 tablet 0 Taking  . polyethylene glycol powder (GLYCOLAX/MIRALAX) powder 255 grams one bottle for colonoscopy prep 255 g 0 Taking   Allergies  Allergen Reactions  . Influenza Vaccines Shortness Of Breath   Past Medical History:  Diagnosis Date  . Anxiety   . Back pain   . Chronic headaches   . Cyst (solitary) of breast    bilaterally  . GERD (gastroesophageal reflux disease)   . Hypertension    Past Surgical History:  Procedure Laterality Date  . ABDOMINAL HYSTERECTOMY  2001  . APPENDECTOMY  1962  . breast cysts    . BREAST SURGERY  2004, 2005   left breast cyst drained x 2  . COLONOSCOPY  06/08/2006  . TONSILLECTOMY  1970   Social History   Social History  . Marital status: Married    Spouse name: N/A  . Number of children: N/A  . Years of education: 28   Occupational History  . Homemaker    Social History Main Topics  . Smoking status: Never Smoker  . Smokeless tobacco: Never Used  . Alcohol use No  . Drug use: No  . Sexual activity: Not on file   Other Topics Concern  . Not on file   Social History Narrative   Regular exercise-yes   Caffeine use-yes         Social History   Social History Narrative   Regular exercise-yes   Caffeine use-yes           ROS: Negative.     PE: HEENT: Negative. Lungs: Clear. Cardio: RR.  Assessment/Plan:  Proceed with planned endoscopy.  Robert Bellow 06/29/2016

## 2016-06-29 NOTE — Transfer of Care (Signed)
Immediate Anesthesia Transfer of Care Note  Patient: Rhonda Brock  Procedure(s) Performed: Procedure(s): COLONOSCOPY WITH PROPOFOL (N/A)  Patient Location: PACU  Anesthesia Type:General  Level of Consciousness: awake, alert  and oriented  Airway & Oxygen Therapy: Patient Spontanous Breathing and Patient connected to nasal cannula oxygen  Post-op Assessment: Report given to RN and Post -op Vital signs reviewed and stable  Post vital signs: Reviewed and stable  Last Vitals:  Vitals:   06/29/16 0800 06/29/16 0803  BP: (P) 115/76 115/76  Pulse: (P) 79 81  Resp: (P) 15 17  Temp: (P) 36.4 C 37 C    Last Pain:  Vitals:   06/29/16 0803  TempSrc: Tympanic         Complications: No apparent anesthesia complications

## 2016-06-29 NOTE — Op Note (Signed)
[facility] Gastroenterology Patient Name: Rhonda Brock Procedure Date: 06/29/2016 7:29 AM MRN: [id number] Account #: [id number] Date of Birth: 03/28/1956 Admit Type: Outpatient Age: 61 Room: [facility] ENDO ROOM 1 Gender: Female Note Status: Finalized Procedure:            Colonoscopy Indications:          Screening for colorectal malignant neoplasm Providers:            Robert Bellow, MD Referring MD:         Deborra Medina, MD (Referring MD) Medicines:            Monitored Anesthesia Care Complications:        No immediate complications. Procedure:            Pre-Anesthesia Assessment:                       - Prior to the procedure, a History and Physical was                        performed, and patient medications, allergies and                        sensitivities were reviewed. The patient's tolerance of                        previous anesthesia was reviewed.                       - The risks and benefits of the procedure and the                        sedation options and risks were discussed with the                        patient. All questions were answered and informed                        consent was obtained.                       After obtaining informed consent, the colonoscope was                        passed under direct vision. Throughout the procedure,                        the patient's blood pressure, pulse, and oxygen                        saturations were monitored continuously. The                        Colonoscope was introduced through the anus and                        advanced to the the terminal ileum. The colonoscopy was                        performed without difficulty. The patient tolerated the  procedure well. The quality of the bowel preparation                        was good. Findings:      The entire examined colon appeared normal on direct and retroflexion       views. Impression:           -  The entire examined colon is normal on direct and                        retroflexion views.                       - No specimens collected. Recommendation:       - Repeat colonoscopy in 10 years for screening purposes. Procedure Code(s):    --- Professional ---                       [phone], Colonoscopy, flexible; diagnostic, including                        collection of specimen(s) by brushing or washing, when                        performed (separate procedure) Diagnosis Code(s):    --- Professional ---                       Z12.11, Encounter for screening for malignant neoplasm                        of colon CPT copyright 2016 [employer]. All rights reserved. The codes documented in this report are preliminary and upon coder review may  be revised to meet current compliance requirements. Robert Bellow, MD 06/29/2016 7:59:59 AM This report has been signed electronically. Number of Addenda: 0 Note Initiated On: 06/29/2016 7:29 AM Scope Withdrawal Time: 0 hours 9 minutes 22 seconds  Total Procedure Duration: 0 hours 15 minutes 56 seconds       [facility]

## 2016-06-29 NOTE — Anesthesia Preprocedure Evaluation (Signed)
Anesthesia Evaluation  Patient identified by MRN, date of birth, ID band Patient awake    Reviewed: Allergy & Precautions, H&P , NPO status , Patient's Chart, lab work & pertinent test results, reviewed documented beta blocker date and time   Airway Mallampati: II   Neck ROM: full    Dental  (+) Teeth Intact   Pulmonary neg pulmonary ROS,    Pulmonary exam normal        Cardiovascular Exercise Tolerance: Poor hypertension, negative cardio ROS Normal cardiovascular exam Rhythm:regular Rate:Normal     Neuro/Psych  Headaches, PSYCHIATRIC DISORDERS negative neurological ROS  negative psych ROS   GI/Hepatic negative GI ROS, Neg liver ROS, GERD  Medicated,  Endo/Other  negative endocrine ROS  Renal/GU negative Renal ROS  negative genitourinary   Musculoskeletal   Abdominal   Peds  Hematology negative hematology ROS (+)   Anesthesia Other Findings Past Medical History: No date: Anxiety No date: Back pain No date: Chronic headaches No date: Cyst (solitary) of breast     Comment: bilaterally No date: GERD (gastroesophageal reflux disease) No date: Hypertension Past Surgical History: 2001: ABDOMINAL HYSTERECTOMY 1962: APPENDECTOMY No date: breast cysts 2004, 2005: BREAST SURGERY     Comment: left breast cyst drained x 2 06/08/2006: COLONOSCOPY 1970: TONSILLECTOMY   Reproductive/Obstetrics negative OB ROS                             Anesthesia Physical Anesthesia Plan  ASA: III  Anesthesia Plan: General   Post-op Pain Management:    Induction:   Airway Management Planned:   Additional Equipment:   Intra-op Plan:   Post-operative Plan:   Informed Consent: I have reviewed the patients History and Physical, chart, labs and discussed the procedure including the risks, benefits and alternatives for the proposed anesthesia with the patient or authorized representative who has  indicated his/her understanding and acceptance.   Dental Advisory Given  Plan Discussed with: CRNA  Anesthesia Plan Comments:         Anesthesia Quick Evaluation

## 2016-06-29 NOTE — Anesthesia Post-op Follow-up Note (Cosign Needed)
Anesthesia QCDR form completed.        

## 2016-06-30 NOTE — Anesthesia Postprocedure Evaluation (Signed)
Anesthesia Post Note  Patient: Rhonda Brock  Procedure(s) Performed: Procedure(s) (LRB): COLONOSCOPY WITH PROPOFOL (N/A)  Patient location during evaluation: PACU Anesthesia Type: General Level of consciousness: awake and alert Pain management: pain level controlled Vital Signs Assessment: post-procedure vital signs reviewed and stable Respiratory status: spontaneous breathing, nonlabored ventilation, respiratory function stable and patient connected to nasal cannula oxygen Cardiovascular status: blood pressure returned to baseline and stable Postop Assessment: no signs of nausea or vomiting Anesthetic complications: no     Last Vitals:  Vitals:   06/29/16 0820 06/29/16 0830  BP: 129/80 115/81  Pulse: 73 68  Resp: 14 14  Temp:      Last Pain:  Vitals:   06/30/16 0740  TempSrc:   PainSc: 0-No pain                 [name]

## 2016-08-24 ENCOUNTER — Other Ambulatory Visit: Payer: Self-pay | Admitting: Family

## 2016-08-24 DIAGNOSIS — R030 Elevated blood-pressure reading, without diagnosis of hypertension: Secondary | ICD-10-CM

## 2016-08-31 ENCOUNTER — Telehealth: Payer: Self-pay | Admitting: Internal Medicine

## 2016-08-31 DIAGNOSIS — R030 Elevated blood-pressure reading, without diagnosis of hypertension: Secondary | ICD-10-CM

## 2016-08-31 NOTE — Telephone Encounter (Signed)
Pt dropped off her BP/Pulse results for Dr. Derrel Nip. Papers are up front in Dr. Lupita Dawn color folder.

## 2016-09-01 NOTE — Telephone Encounter (Signed)
Paperwork has been placed in the yellow folder.

## 2016-09-02 MED ORDER — LISINOPRIL 5 MG PO TABS: 5.0000 mg | ORAL_TABLET | Freq: Every day | ORAL | 1 refills | Status: DC

## 2016-09-07 ENCOUNTER — Encounter: Payer: Self-pay | Admitting: Internal Medicine

## 2016-09-26 ENCOUNTER — Encounter: Payer: Self-pay | Admitting: Internal Medicine

## 2016-09-27 ENCOUNTER — Other Ambulatory Visit: Payer: Self-pay | Admitting: Internal Medicine

## 2016-09-27 DIAGNOSIS — I10 Essential (primary) hypertension: Secondary | ICD-10-CM

## 2016-09-27 NOTE — Assessment & Plan Note (Signed)
Did not tolerate lisinopril due to nausea and palpitations.  Home bps have been normal without it.

## 2016-12-18 ENCOUNTER — Telehealth: Payer: Self-pay | Admitting: Internal Medicine

## 2016-12-18 NOTE — Telephone Encounter (Signed)
Appointment postponed  For 6 month follow up per phone conversation Sunday .  No specific date requested; please call

## 2016-12-19 ENCOUNTER — Ambulatory Visit: Payer: Private Health Insurance - Indemnity | Admitting: Internal Medicine

## 2017-01-24 ENCOUNTER — Ambulatory Visit (INDEPENDENT_AMBULATORY_CARE_PROVIDER_SITE_OTHER): Payer: Private Health Insurance - Indemnity | Admitting: Internal Medicine

## 2017-01-24 ENCOUNTER — Encounter: Payer: Self-pay | Admitting: Internal Medicine

## 2017-01-24 VITALS — BP 148/88 | HR 75 | Temp 98.2°F | Resp 15 | Ht 65.0 in | Wt 214.6 lb

## 2017-01-24 DIAGNOSIS — E669 Obesity, unspecified: Secondary | ICD-10-CM | POA: Diagnosis not present

## 2017-01-24 DIAGNOSIS — E781 Pure hyperglyceridemia: Secondary | ICD-10-CM | POA: Diagnosis not present

## 2017-01-24 DIAGNOSIS — R5383 Other fatigue: Secondary | ICD-10-CM

## 2017-01-24 DIAGNOSIS — I10 Essential (primary) hypertension: Secondary | ICD-10-CM | POA: Diagnosis not present

## 2017-01-24 LAB — BASIC METABOLIC PANEL
BUN: 18 mg/dL (ref 6–23)
CALCIUM: 9.5 mg/dL (ref 8.4–10.5)
CO2: 31 mEq/L (ref 19–32)
CREATININE: 0.78 mg/dL (ref 0.40–1.20)
Chloride: 105 mEq/L (ref 96–112)
GFR: 79.62 mL/min (ref >60.00)
Glucose, Bld: 110 mg/dL — ABNORMAL HIGH (ref 70–99)
POTASSIUM: 4.5 meq/L (ref 3.5–5.1)
Sodium: 141 mEq/L (ref 135–145)

## 2017-01-24 MED ORDER — HYDROCHLOROTHIAZIDE 12.5 MG PO TABS: 12.5000 mg | ORAL_TABLET | Freq: Every day | ORAL | 1 refills | Status: DC

## 2017-01-24 NOTE — Patient Instructions (Addendum)
Adding medication called  hctz 12.5 mg once daily  in am for BP  Return in 2 weeks for fasting labs and a BP check and  (lab and RN visit )    Dr Milta Deiters Med's sinus irrigation system after flying

## 2017-01-24 NOTE — Progress Notes (Signed)
Subjective:  Patient ID: Rhonda Brock, female    DOB: 11-07-1955  Age: 61 y.o. MRN: [id number]  CC: The primary encounter diagnosis was Hypertension, essential. Diagnoses of Obesity (BMI 30-39.9), Hypertriglyceridemia, Iron overload, and Caregiver with fatigue were also pertinent to this visit.  HPI Rhonda Brock presents for FOLLOW UP on hypertension and fatigue (caregiver)   Hypertension: she has stopped the lisinopril in may due to palpitations and low BP.  She has been checking her blood pressure and pulse on a daily basis since stopping the medication and all blood pressure readings have been below 130/80.  Palpitations have not recurred since she stopped the lisinopril.  She iswalking 3 days weekly using [place]  Declines flu vaccine :  Had a bad reaction to influenza vaccine several years ago and developed  chest tightness and dyupnea   Caregiver fatigue: Her fatigue has improved with the husband's  improvement working with a new physical therapy team at [facility].    Outpatient Medications Prior to Visit  Medication Sig Dispense Refill  . Blood Pressure KIT Use once or twice daily to monitor blood pressure 1 each 0   No facility-administered medications prior to visit.     Review of Systems;  Patient denies headache, fevers, malaise, unintentional weight loss, skin rash, eye pain, sinus congestion and sinus pain, sore throat, dysphagia,  hemoptysis , cough, dyspnea, wheezing, chest pain, palpitations, orthopnea, edema, abdominal pain, nausea, melena, diarrhea, constipation, flank pain, dysuria, hematuria, urinary  Frequency, nocturia, numbness, tingling, seizures,  Focal weakness, Loss of consciousness,  Tremor, insomnia, depression, anxiety, and suicidal ideation.      Objective:  BP (!) 148/88 (BP Location: Left Arm, Patient Position: Sitting, Cuff Size: Large)   Pulse 75   Temp 98.2 F (36.8 C) (Oral)   Resp 15   Ht '5\' 5"'  (1.651 m)   Wt 214 lb 9.6 oz (97.3 kg)    SpO2 97%   BMI 35.71 kg/m   BP Readings from Last 3 Encounters:  01/24/17 (!) 148/88  06/29/16 115/81  06/13/16 118/82    Wt Readings from Last 3 Encounters:  01/24/17 214 lb 9.6 oz (97.3 kg)  06/29/16 210 lb (95.3 kg)  06/13/16 210 lb 6.4 oz (95.4 kg)    General appearance: alert, cooperative and appears stated age Ears: normal TM's and external ear canals both ears Throat: lips, mucosa, and tongue normal; teeth and gums normal Neck: no adenopathy, no carotid bruit, supple, symmetrical, trachea midline and thyroid not enlarged, symmetric, no tenderness/mass/nodules Back: symmetric, no curvature. ROM normal. No CVA tenderness. Lungs: clear to auscultation bilaterally Heart: regular rate and rhythm, S1, S2 normal, no murmur, click, rub or gallop Abdomen: soft, non-tender; bowel sounds normal; no masses,  no organomegaly Pulses: 2+ and symmetric Skin: Skin color, texture, turgor normal. No rashes or lesions Lymph nodes: Cervical, supraclavicular, and axillary nodes normal.  No results found for: HGBA1C  Lab Results  Component Value Date   CREATININE 0.78 01/24/2017   CREATININE 0.98 06/02/2016   CREATININE 0.91 05/26/2016    Lab Results  Component Value Date   WBC 13.4 (H) 08/04/2014   HGB 13.7 08/04/2014   HCT 40.2 08/04/2014   PLT 277.0 08/04/2014   GLUCOSE 110 (H) 01/24/2017   CHOL 187 06/30/2014   TRIG 231.0 (H) 06/30/2014   HDL 44.90 06/30/2014   LDLDIRECT 119.0 06/30/2014   ALT 19 05/10/2016   AST 19 05/10/2016   NA 141 01/24/2017   K 4.5 01/24/2017  CL 105 01/24/2017   CREATININE 0.78 01/24/2017   BUN 18 01/24/2017   CO2 31 01/24/2017   TSH 1.41 05/10/2016    No results found.  Assessment & Plan:   Problem List Items Addressed This Visit    Caregiver with fatigue    Improved with increased support from caregivers and physical therapist at [facility] who are working with her husband.      Hypertension, essential - Primary    Her hypertensive  episodes have resolved.  She is currently normotensive without medications.      Relevant Medications   hydrochlorothiazide (HYDRODIURIL) 12.5 MG tablet   Other Relevant Orders   Basic metabolic panel (Completed)   Hypertriglyceridemia    Mild, with normal LDL and HDL. Marland Kitchen  She is overdue for repeat testing.   Lab Results  Component Value Date   CHOL 187 06/30/2014   HDL 44.90 06/30/2014   LDLDIRECT 119.0 06/30/2014   TRIG 231.0 (H) 06/30/2014   CHOLHDL 4 06/30/2014          Relevant Medications   hydrochlorothiazide (HYDRODIURIL) 12.5 MG tablet   Iron overload    Her  iron stores remain slightly elevated but her hgb is normal , as are her liver enzymes.   Lab Results  Component Value Date   ALT 19 05/10/2016   AST 19 05/10/2016   ALKPHOS 66 05/10/2016   BILITOT 0.9 05/10/2016   Lab Results  Component Value Date   IRON 142 06/30/2014   TIBC 237 (L) 06/30/2014   FERRITIN 367 12/16/2011        Obesity (BMI 30-39.9)    I have encouraged her to continue her walking program and increase it to 5 days weekly.  BMI addressed, Mediterranean diet recommended.         I am having Ms. Ronan start on hydrochlorothiazide. I am also having her maintain her Blood Pressure.  Meds ordered this encounter  Medications  . hydrochlorothiazide (HYDRODIURIL) 12.5 MG tablet    Sig: Take 1 tablet (12.5 mg total) by mouth daily.    Dispense:  90 tablet    Refill:  1    There are no discontinued medications.  Follow-up: No Follow-up on file.   Crecencio Mc, MD

## 2017-01-26 NOTE — Assessment & Plan Note (Signed)
Her  iron stores remain slightly elevated but her hgb is normal , as are her liver enzymes.   Lab Results  Component Value Date   ALT 19 05/10/2016   AST 19 05/10/2016   ALKPHOS 66 05/10/2016   BILITOT 0.9 05/10/2016   Lab Results  Component Value Date   IRON 142 06/30/2014   TIBC 237 (L) 06/30/2014   FERRITIN 367 12/16/2011

## 2017-01-26 NOTE — Assessment & Plan Note (Signed)
Her hypertensive episodes have resolved.  She is currently normotensive without medications.

## 2017-01-26 NOTE — Assessment & Plan Note (Signed)
I have encouraged her to continue her walking program and increase it to 5 days weekly.  BMI addressed, Mediterranean diet recommended.

## 2017-01-26 NOTE — Assessment & Plan Note (Signed)
Mild, with normal LDL and HDL. Marland Kitchen  She is overdue for repeat testing.   Lab Results  Component Value Date   CHOL 187 06/30/2014   HDL 44.90 06/30/2014   LDLDIRECT 119.0 06/30/2014   TRIG 231.0 (H) 06/30/2014   CHOLHDL 4 06/30/2014

## 2017-01-26 NOTE — Assessment & Plan Note (Signed)
Improved with increased support from caregivers and physical therapist at [facility] who are working with her husband.

## 2017-02-08 ENCOUNTER — Encounter: Payer: Self-pay | Admitting: Internal Medicine

## 2017-02-09 ENCOUNTER — Ambulatory Visit (INDEPENDENT_AMBULATORY_CARE_PROVIDER_SITE_OTHER): Payer: Private Health Insurance - Indemnity

## 2017-02-09 ENCOUNTER — Other Ambulatory Visit (INDEPENDENT_AMBULATORY_CARE_PROVIDER_SITE_OTHER): Payer: Private Health Insurance - Indemnity

## 2017-02-09 VITALS — BP 138/88 | HR 70

## 2017-02-09 DIAGNOSIS — I10 Essential (primary) hypertension: Secondary | ICD-10-CM | POA: Diagnosis not present

## 2017-02-09 DIAGNOSIS — Z0130 Encounter for examination of blood pressure without abnormal findings: Secondary | ICD-10-CM | POA: Diagnosis not present

## 2017-02-09 DIAGNOSIS — E781 Pure hyperglyceridemia: Secondary | ICD-10-CM

## 2017-02-09 DIAGNOSIS — R5383 Other fatigue: Secondary | ICD-10-CM

## 2017-02-09 LAB — CBC WITH DIFFERENTIAL/PLATELET
BASOS PCT: 0.9 % (ref 0.0–3.0)
Basophils Absolute: 0.1 10*3/uL (ref 0.0–0.1)
EOS ABS: 0.2 10*3/uL (ref 0.0–0.7)
EOS PCT: 2.6 % (ref 0.0–5.0)
HEMATOCRIT: 41.9 % (ref 36.0–46.0)
HEMOGLOBIN: 13.9 g/dL (ref 12.0–15.0)
LYMPHS PCT: 31.4 % (ref 12.0–46.0)
Lymphs Abs: 2.9 10*3/uL (ref 0.7–4.0)
MCHC: 33.3 g/dL (ref 30.0–36.0)
MCV: 96.6 fl (ref 78.0–100.0)
Monocytes Absolute: 0.7 10*3/uL (ref 0.1–1.0)
Monocytes Relative: 8 % (ref 3.0–12.0)
NEUTROS ABS: 5.3 10*3/uL (ref 1.4–7.7)
Neutrophils Relative %: 57.1 % (ref 43.0–77.0)
PLATELETS: 291 10*3/uL (ref 150.0–400.0)
RBC: 4.33 Mil/uL (ref 3.87–5.11)
RDW: 13.1 % (ref 11.5–15.5)
WBC: 9.2 10*3/uL (ref 4.0–10.5)

## 2017-02-09 LAB — LIPID PANEL
Cholesterol: 191 mg/dL (ref 0–200)
HDL: 49.6 mg/dL (ref >39.00)
LDL Cholesterol: 106 mg/dL — ABNORMAL HIGH (ref 0–99)
NONHDL: 141.7
TRIGLYCERIDES: 180 mg/dL — AB (ref 0.0–149.0)
Total CHOL/HDL Ratio: 4
VLDL: 36 mg/dL (ref 0.0–40.0)

## 2017-02-09 LAB — COMPREHENSIVE METABOLIC PANEL
ALK PHOS: 65 U/L (ref 39–117)
ALT: 18 U/L (ref 0–35)
AST: 21 U/L (ref 0–37)
Albumin: 4.1 g/dL (ref 3.5–5.2)
BILIRUBIN TOTAL: 0.8 mg/dL (ref 0.2–1.2)
BUN: 16 mg/dL (ref 6–23)
CALCIUM: 9.8 mg/dL (ref 8.4–10.5)
CO2: 30 mEq/L (ref 19–32)
Chloride: 104 mEq/L (ref 96–112)
Creatinine, Ser: 0.93 mg/dL (ref 0.40–1.20)
GFR: 64.99 mL/min (ref >60.00)
GLUCOSE: 103 mg/dL — AB (ref 70–99)
POTASSIUM: 4.2 meq/L (ref 3.5–5.1)
Sodium: 140 mEq/L (ref 135–145)
TOTAL PROTEIN: 7 g/dL (ref 6.0–8.3)

## 2017-02-09 LAB — TSH: TSH: 2.4 u[IU]/mL (ref 0.35–4.50)

## 2017-02-09 NOTE — Progress Notes (Addendum)
Patient came in for BP check. She was informed by Dr. Derrel Nip to come in due to medication change to HCTZ. Patient was unable to tolerate last medication. BP in Right arm was 138/88. BP in the left arm was 136/94.     I have reviewed the above information and agree with above.   Deborra Medina, MD

## 2017-02-12 ENCOUNTER — Encounter: Payer: Self-pay | Admitting: Internal Medicine

## 2017-03-04 ENCOUNTER — Encounter: Payer: Self-pay | Admitting: Internal Medicine

## 2017-03-08 ENCOUNTER — Encounter: Payer: Self-pay | Admitting: Internal Medicine

## 2017-03-08 MED ORDER — HYDROCHLOROTHIAZIDE 25 MG PO TABS: 25.0000 mg | ORAL_TABLET | Freq: Every day | ORAL | 0 refills | Status: DC

## 2017-04-03 ENCOUNTER — Other Ambulatory Visit: Payer: Self-pay

## 2017-04-03 MED ORDER — HYDROCHLOROTHIAZIDE 25 MG PO TABS: 25.0000 mg | ORAL_TABLET | Freq: Every day | ORAL | 1 refills | Status: DC

## 2017-04-20 ENCOUNTER — Encounter: Payer: Self-pay | Admitting: Internal Medicine

## 2017-06-12 ENCOUNTER — Encounter: Payer: Self-pay | Admitting: Internal Medicine

## 2017-07-24 ENCOUNTER — Encounter: Payer: Self-pay | Admitting: Internal Medicine

## 2017-07-24 DIAGNOSIS — I10 Essential (primary) hypertension: Secondary | ICD-10-CM

## 2017-08-09 ENCOUNTER — Other Ambulatory Visit (INDEPENDENT_AMBULATORY_CARE_PROVIDER_SITE_OTHER): Payer: Private Health Insurance - Indemnity

## 2017-08-09 DIAGNOSIS — I10 Essential (primary) hypertension: Secondary | ICD-10-CM

## 2017-08-09 LAB — COMPREHENSIVE METABOLIC PANEL
ALT: 19 U/L (ref 0–35)
AST: 20 U/L (ref 0–37)
Albumin: 4.1 g/dL (ref 3.5–5.2)
Alkaline Phosphatase: 58 U/L (ref 39–117)
BILIRUBIN TOTAL: 0.8 mg/dL (ref 0.2–1.2)
BUN: 13 mg/dL (ref 6–23)
CALCIUM: 9.4 mg/dL (ref 8.4–10.5)
CO2: 32 meq/L (ref 19–32)
CREATININE: 0.97 mg/dL (ref 0.40–1.20)
Chloride: 101 mEq/L (ref 96–112)
GFR: 61.8 mL/min (ref >60.00)
Glucose, Bld: 104 mg/dL — ABNORMAL HIGH (ref 70–99)
Potassium: 3.9 mEq/L (ref 3.5–5.1)
SODIUM: 139 meq/L (ref 135–145)
Total Protein: 7.1 g/dL (ref 6.0–8.3)

## 2017-09-15 LAB — HM MAMMOGRAPHY

## 2017-09-27 LAB — HM PAP SMEAR: HM Pap smear: NEGATIVE

## 2017-10-06 ENCOUNTER — Encounter: Payer: Self-pay | Admitting: Internal Medicine

## 2017-10-17 ENCOUNTER — Other Ambulatory Visit: Payer: Self-pay | Admitting: Internal Medicine

## 2018-01-10 ENCOUNTER — Telehealth: Payer: Self-pay

## 2018-01-10 ENCOUNTER — Other Ambulatory Visit: Payer: Self-pay | Admitting: Internal Medicine

## 2018-01-10 DIAGNOSIS — Z0000 Encounter for general adult medical examination without abnormal findings: Secondary | ICD-10-CM

## 2018-01-10 NOTE — Telephone Encounter (Signed)
Thank  You,  I  added iron

## 2018-01-10 NOTE — Telephone Encounter (Signed)
Scheduled pt for a physical and fasting lab appt. Ordered CBC, CMP, A1c, Lipid, and TSH. Is there anything else that needs to be ordered?

## 2018-01-10 NOTE — Addendum Note (Signed)
Addended by: Crecencio Mc on: 01/10/2018 12:13 PM   Modules accepted: Orders

## 2018-01-11 ENCOUNTER — Ambulatory Visit (INDEPENDENT_AMBULATORY_CARE_PROVIDER_SITE_OTHER): Payer: Private Health Insurance - Indemnity | Admitting: *Deleted

## 2018-01-11 DIAGNOSIS — Z23 Encounter for immunization: Secondary | ICD-10-CM

## 2018-02-19 ENCOUNTER — Other Ambulatory Visit (INDEPENDENT_AMBULATORY_CARE_PROVIDER_SITE_OTHER): Payer: Private Health Insurance - Indemnity

## 2018-02-19 DIAGNOSIS — Z0000 Encounter for general adult medical examination without abnormal findings: Secondary | ICD-10-CM | POA: Diagnosis not present

## 2018-02-19 LAB — CBC WITH DIFFERENTIAL/PLATELET
BASOS PCT: 0.9 % (ref 0.0–3.0)
Basophils Absolute: 0.1 10*3/uL (ref 0.0–0.1)
EOS PCT: 2.5 % (ref 0.0–5.0)
Eosinophils Absolute: 0.2 10*3/uL (ref 0.0–0.7)
HCT: 43.1 % (ref 36.0–46.0)
HEMOGLOBIN: 14.8 g/dL (ref 12.0–15.0)
Lymphocytes Relative: 25.6 % (ref 12.0–46.0)
Lymphs Abs: 1.9 10*3/uL (ref 0.7–4.0)
MCHC: 34.5 g/dL (ref 30.0–36.0)
MCV: 95.7 fl (ref 78.0–100.0)
MONO ABS: 0.5 10*3/uL (ref 0.1–1.0)
Monocytes Relative: 7.4 % (ref 3.0–12.0)
Neutro Abs: 4.6 10*3/uL (ref 1.4–7.7)
Neutrophils Relative %: 63.6 % (ref 43.0–77.0)
Platelets: 294 10*3/uL (ref 150.0–400.0)
RBC: 4.5 Mil/uL (ref 3.87–5.11)
RDW: 13 % (ref 11.5–15.5)
WBC: 7.3 10*3/uL (ref 4.0–10.5)

## 2018-02-19 LAB — COMPREHENSIVE METABOLIC PANEL
ALBUMIN: 4.1 g/dL (ref 3.5–5.2)
ALK PHOS: 57 U/L (ref 39–117)
ALT: 26 U/L (ref 0–35)
AST: 26 U/L (ref 0–37)
BUN: 15 mg/dL (ref 6–23)
CHLORIDE: 102 meq/L (ref 96–112)
CO2: 30 mEq/L (ref 19–32)
Calcium: 9.4 mg/dL (ref 8.4–10.5)
Creatinine, Ser: 0.91 mg/dL (ref 0.40–1.20)
GFR: 66.41 mL/min (ref >60.00)
Glucose, Bld: 107 mg/dL — ABNORMAL HIGH (ref 70–99)
POTASSIUM: 3.9 meq/L (ref 3.5–5.1)
SODIUM: 139 meq/L (ref 135–145)
TOTAL PROTEIN: 6.9 g/dL (ref 6.0–8.3)
Total Bilirubin: 0.8 mg/dL (ref 0.2–1.2)

## 2018-02-19 LAB — LIPID PANEL
Cholesterol: 178 mg/dL (ref 0–200)
HDL: 49.6 mg/dL (ref >39.00)
NonHDL: 128.08
Total CHOL/HDL Ratio: 4
Triglycerides: 202 mg/dL — ABNORMAL HIGH (ref 0.0–149.0)
VLDL: 40.4 mg/dL — ABNORMAL HIGH (ref 0.0–40.0)

## 2018-02-19 LAB — HEMOGLOBIN A1C: Hgb A1c MFr Bld: 5.6 % (ref 4.6–6.5)

## 2018-02-19 LAB — TSH: TSH: 1.81 u[IU]/mL (ref 0.35–4.50)

## 2018-02-19 LAB — LDL CHOLESTEROL, DIRECT: Direct LDL: 117 mg/dL

## 2018-02-20 LAB — IRON,TIBC AND FERRITIN PANEL
%SAT: 66 % — AB (ref 16–45)
Ferritin: 623 ng/mL — ABNORMAL HIGH (ref 16–288)
IRON: 160 ug/dL (ref 45–160)
TIBC: 243 ug/dL — AB (ref 250–450)

## 2018-02-21 ENCOUNTER — Encounter: Payer: Self-pay | Admitting: Internal Medicine

## 2018-02-21 ENCOUNTER — Ambulatory Visit (INDEPENDENT_AMBULATORY_CARE_PROVIDER_SITE_OTHER): Payer: Private Health Insurance - Indemnity | Admitting: Internal Medicine

## 2018-02-21 DIAGNOSIS — E781 Pure hyperglyceridemia: Secondary | ICD-10-CM | POA: Diagnosis not present

## 2018-02-21 DIAGNOSIS — E669 Obesity, unspecified: Secondary | ICD-10-CM | POA: Diagnosis not present

## 2018-02-21 DIAGNOSIS — I10 Essential (primary) hypertension: Secondary | ICD-10-CM

## 2018-02-21 MED ORDER — HYDROCHLOROTHIAZIDE 25 MG PO TABS: 25.0000 mg | ORAL_TABLET | Freq: Every day | ORAL | 1 refills | Status: DC

## 2018-02-21 NOTE — Progress Notes (Signed)
Patient ID: Rhonda Brock, female    DOB: Sep 27, 1955  Age: 62 y.o. MRN: [id number]  The patient is here for annual preventive examination and management of other chronic and acute problems.   Sees GYN for breast and pelvic exams/ well woman.   The risk factors are reflected in the social history.  The roster of all physicians providing medical care to patient - is listed in the Snapshot section of the chart.  Activities of daily living:  The patient is 100% independent in all ADLs: dressing, toileting, feeding as well as independent mobility  Home safety : The patient has smoke detectors in the home. They wear seatbelts.  There are no firearms at home. There is no violence in the home.   There is no risks for hepatitis, STDs or HIV. There is no   history of blood transfusion. They have no travel history to infectious disease endemic areas of the world.  The patient has seen their dentist in the last six month. They have seen their eye doctor in the last year. They admit to slight hearing difficulty with regard to whispered voices and some television programs.  They have deferred audiologic testing in the last year.  They do not  have excessive sun exposure. Discussed the need for sun protection: hats, long sleeves and use of sunscreen if there is significant sun exposure.   Diet: the importance of a healthy diet is discussed. They do have a healthy diet.she has lost 13 lbs since her last vicit on oct 30th    The benefits of regular aerobic exercise were discussed. She walks 4 times per week ,  20 minutes.   Depression screen: there are no signs or vegative symptoms of depression- irritability, change in appetite, anhedonia, sadness/tearfullness.  Cognitive assessment: the patient manages all their financial and personal affairs and is actively engaged. They could relate day,date,year and events; recalled 2/3 objects at 3 minutes; performed clock-face test normally.  The following portions  of the patient's history were reviewed and updated as appropriate: allergies, current medications, past family history, past medical history,  past surgical history, past social history  and problem list.  Visual acuity was not assessed per patient preference since she has regular follow up with her ophthalmologist. Hearing and body mass index were assessed and reviewed.   During the course of the visit the patient was educated and counseled about appropriate screening and preventive services including : fall prevention , diabetes screening, nutrition counseling, colorectal cancer screening, and recommended immunizations.    CC: The primary encounter diagnosis was Iron overload. Diagnoses of Hypertension, essential, Obesity (BMI 30-39.9), and Hypertriglyceridemia were also pertinent to this visit.  History Rhonda Brock has a past medical history of Anxiety, Back pain, Chronic headaches, Cyst (solitary) of breast, GERD (gastroesophageal reflux disease), and Hypertension.   She has a past surgical history that includes Appendectomy (1962); Tonsillectomy (1970); Abdominal hysterectomy (2001); Breast surgery (2004, 2005); breast cysts; Colonoscopy (06/08/2006); and Colonoscopy with propofol (N/A, 06/29/2016).   Her family history includes Asthma in her father; Cancer (age of onset: 34) in her mother.She reports that she has never smoked. She has never used smokeless tobacco. She reports that she does not drink alcohol or use drugs.  Outpatient Medications Prior to Visit  Medication Sig Dispense Refill  . hydrochlorothiazide (HYDRODIURIL) 25 MG tablet TAKE 1 TABLET BY MOUTH EVERY DAY 90 tablet 0  . Blood Pressure KIT Use once or twice daily to monitor blood pressure 1 each 0  No facility-administered medications prior to visit.     Review of Systems   Patient denies headache, fevers, malaise, unintentional weight loss, skin rash, eye pain, sinus congestion and sinus pain, sore throat, dysphagia,   hemoptysis , cough, dyspnea, wheezing, chest pain, palpitations, orthopnea, edema, abdominal pain, nausea, melena, diarrhea, constipation, flank pain, dysuria, hematuria, urinary  Frequency, nocturia, numbness, tingling, seizures,  Focal weakness, Loss of consciousness,  Tremor, insomnia, depression, anxiety, and suicidal ideation.      Objective:  BP 130/82 (BP Location: Left Arm, Patient Position: Sitting, Cuff Size: Large)   Pulse 82   Temp 98.3 F (36.8 C) (Oral)   Resp 15   Ht _0  (1.651 m)   Wt 201 lb 12.8 oz (91.5 kg)   SpO2 96%   BMI 33.58 kg/m   Physical Exam   General appearance: alert, cooperative and appears stated age Ears: normal TM's and external ear canals both ears Throat: lips, mucosa, and tongue normal; teeth and gums normal Neck: no adenopathy, no carotid bruit, supple, symmetrical, trachea midline and thyroid not enlarged, symmetric, no tenderness/mass/nodules Back: symmetric, no curvature. ROM normal. No CVA tenderness. Lungs: clear to auscultation bilaterally Heart: regular rate and rhythm, S1, S2 normal, no murmur, click, rub or gallop Abdomen: soft, non-tender; bowel sounds normal; no masses,  no organomegaly Pulses: 2+ and symmetric Skin: Skin color, texture, turgor normal. No rashes or lesions Lymph nodes: Cervical, supraclavicular, and axillary nodes normal.   Assessment & Plan:   Problem List Items Addressed This Visit    Hypertension, essential    Well controlled on current regimen by review of home readings.  Her  Renal function stable, no changes today.  Lab Results  Component Value Date   CREATININE 0.91 02/19/2018   Lab Results  Component Value Date   NA 139 02/19/2018   K 3.9 02/19/2018   CL 102 02/19/2018   CO2 30 02/19/2018         Relevant Medications   hydrochlorothiazide (HYDRODIURIL) 25 MG tablet   Hypertriglyceridemia    Mild, with normal LDL and HDL. Marland Kitchen   No medications indicated    Lab Results  Component Value Date    CHOL 178 02/19/2018   HDL 49.60 02/19/2018   LDLCALC 106 (H) 02/09/2017   LDLDIRECT 117.0 02/19/2018   TRIG 202.0 (H) 02/19/2018   CHOLHDL 4 02/19/2018          Relevant Medications   hydrochlorothiazide (HYDRODIURIL) 25 MG tablet   Iron overload - Primary    .Iron  Stores are chronically mildly elevated without anemia,  polycythemia or transaminitis .  Her  TSAT is 65% .  Will refer to hematology for phlebotomy.   Lab Results  Component Value Date   IRON 160 02/19/2018   TIBC 243 (L) 02/19/2018   FERRITIN 623 (H) 02/19/2018          Relevant Orders   CBC with Differential/Platelet   Comprehensive metabolic panel   Iron, TIBC and Ferritin Panel   Ambulatory referral to Hematology   Obesity (BMI 30-39.9)    I have congratulated her in reduction of   BMI and encouraged  Continued weight loss with goal of 10% of body weigh over the next 6 months using a low glycemic index diet and regular exercise a minimum of 5 days per week. I have encouraged her to continue her walking program and increase it to 5 days weekly.          I have discontinued  Cathy Czaja's Blood Pressure. I have also changed her hydrochlorothiazide.  Meds ordered this encounter  Medications  . hydrochlorothiazide (HYDRODIURIL) 25 MG tablet    Sig: Take 1 tablet (25 mg total) by mouth daily.    Dispense:  90 tablet    Refill:  1    KEEP ON FILE FOR FUTURE REFILLS    Medications Discontinued During This Encounter  Medication Reason  . Blood Pressure KIT Error  . hydrochlorothiazide (HYDRODIURIL) 25 MG tablet Reorder    Follow-up: No follow-ups on file.   Crecencio Mc, MD

## 2018-02-21 NOTE — Patient Instructions (Signed)
You are doing very well!     We made no changes  Today;  Continue hctz  We will repeat your iron and CBC in 3 months and if your hgb is > 15 we'll arrange phlebotomy

## 2018-02-23 ENCOUNTER — Telehealth: Payer: Self-pay | Admitting: Internal Medicine

## 2018-02-23 NOTE — Assessment & Plan Note (Addendum)
.  Iron  Stores are chronically mildly elevated without anemia,  polycythemia or transaminitis .  Her  TSAT is 65% .  Will refer to hematology for phlebotomy.   Lab Results  Component Value Date   IRON 160 02/19/2018   TIBC 243 (L) 02/19/2018   FERRITIN 623 (H) 02/19/2018

## 2018-02-23 NOTE — Assessment & Plan Note (Signed)
Well controlled on current regimen by review of home readings.  Her  Renal function stable, no changes today.  Lab Results  Component Value Date   CREATININE 0.91 02/19/2018   Lab Results  Component Value Date   NA 139 02/19/2018   K 3.9 02/19/2018   CL 102 02/19/2018   CO2 30 02/19/2018

## 2018-02-23 NOTE — Assessment & Plan Note (Addendum)
I have congratulated her in reduction of   BMI and encouraged  Continued weight loss with goal of 10% of body weigh over the next 6 months using a low glycemic index diet and regular exercise a minimum of 5 days per week. I have encouraged her to continue her walking program and increase it to 5 days weekly.

## 2018-02-23 NOTE — Assessment & Plan Note (Signed)
Mild, with normal LDL and HDL. Marland Kitchen   No medications indicated    Lab Results  Component Value Date   CHOL 178 02/19/2018   HDL 49.60 02/19/2018   LDLCALC 106 (H) 02/09/2017   LDLDIRECT 117.0 02/19/2018   TRIG 202.0 (H) 02/19/2018   CHOLHDL 4 02/19/2018

## 2018-03-14 ENCOUNTER — Inpatient Hospital Stay: Payer: Private Health Insurance - Indemnity | Attending: Oncology | Admitting: Oncology

## 2018-03-14 ENCOUNTER — Other Ambulatory Visit: Payer: Self-pay

## 2018-03-14 ENCOUNTER — Encounter: Payer: Self-pay | Admitting: Oncology

## 2018-03-14 ENCOUNTER — Inpatient Hospital Stay: Payer: Private Health Insurance - Indemnity

## 2018-03-14 DIAGNOSIS — R51 Headache: Secondary | ICD-10-CM | POA: Diagnosis not present

## 2018-03-14 DIAGNOSIS — I10 Essential (primary) hypertension: Secondary | ICD-10-CM | POA: Diagnosis not present

## 2018-03-14 DIAGNOSIS — N6001 Solitary cyst of right breast: Secondary | ICD-10-CM | POA: Diagnosis not present

## 2018-03-14 DIAGNOSIS — K219 Gastro-esophageal reflux disease without esophagitis: Secondary | ICD-10-CM

## 2018-03-14 DIAGNOSIS — M549 Dorsalgia, unspecified: Secondary | ICD-10-CM | POA: Diagnosis not present

## 2018-03-14 DIAGNOSIS — N6002 Solitary cyst of left breast: Secondary | ICD-10-CM

## 2018-03-14 DIAGNOSIS — Z803 Family history of malignant neoplasm of breast: Secondary | ICD-10-CM | POA: Diagnosis not present

## 2018-03-14 DIAGNOSIS — Z90710 Acquired absence of both cervix and uterus: Secondary | ICD-10-CM

## 2018-03-14 DIAGNOSIS — F419 Anxiety disorder, unspecified: Secondary | ICD-10-CM

## 2018-03-14 DIAGNOSIS — Z79899 Other long term (current) drug therapy: Secondary | ICD-10-CM | POA: Diagnosis not present

## 2018-03-14 NOTE — Progress Notes (Signed)
Patient here for initial visit. °

## 2018-03-14 NOTE — Progress Notes (Signed)
Hematology/Oncology Consult note [facility] Telephone:([phone] Fax:([phone]   Patient Care Team: Crecencio Mc, MD as PCP - General (Internal Medicine) Crecencio Mc, MD (Internal Medicine) Bary Castilla Forest Gleason, MD (General Surgery)  REFERRING PROVIDER: Crecencio Mc, MD  CHIEF COMPLAINTS/REASON FOR VISIT:  Evaluation of elevated ferritin level.  HISTORY OF PRESENTING ILLNESS:  Rhonda Brock is a  62 y.o.  female with PMH listed below who was referred to me for evaluation of ferritin level Patient has labs done recently which showed elevated ferritin at 623, iron saturation at 66.  Recent liver function tests showed   Context History of previous blood transfusion: denies Shortness of breath: denies Join pain: denies  Fatigue: denies  Reviewed patient's previous labs, patient has labs done on 12/08/2011 which showed elevated iron saturation at 63, ferritin level of 332.8.  Labs on 06/30/2014 iron saturation 60, ferritin was not tested.    Review of Systems  Constitutional: Negative for appetite change, chills, fatigue and fever.  HENT:   Negative for hearing loss and voice change.   Eyes: Negative for eye problems.  Respiratory: Negative for chest tightness and cough.   Cardiovascular: Negative for chest pain.  Gastrointestinal: Negative for abdominal distention, abdominal pain and blood in stool.  Endocrine: Negative for hot flashes.  Genitourinary: Negative for difficulty urinating and frequency.   Musculoskeletal: Negative for arthralgias.  Skin: Negative for itching and rash.  Neurological: Negative for extremity weakness.  Hematological: Negative for adenopathy.  Psychiatric/Behavioral: Negative for confusion.    MEDICAL HISTORY:  Past Medical History:  Diagnosis Date  . Anxiety   . Back pain   . Chronic headaches   . Cyst (solitary) of breast    bilaterally  . GERD (gastroesophageal reflux disease)   . Hypertension      SURGICAL HISTORY: Past Surgical History:  Procedure Laterality Date  . ABDOMINAL HYSTERECTOMY  2001  . APPENDECTOMY  1962  . breast cysts    . BREAST SURGERY  2004, 2005   left breast cyst drained x 2  . COLONOSCOPY  06/08/2006  . COLONOSCOPY WITH PROPOFOL N/A 06/29/2016   Procedure: COLONOSCOPY WITH PROPOFOL;  Surgeon: Robert Bellow, MD;  Location: [facility] ENDOSCOPY;  Service: Endoscopy;  Laterality: N/A;  . TONSILLECTOMY  1970    SOCIAL HISTORY: Social History   Socioeconomic History  . Marital status: Married    Spouse name: Not on file  . Number of children: Not on file  . Years of education: 48  . Highest education level: Not on file  Occupational History  . Occupation: Agricultural engineer  Social Needs  . Financial resource strain: Not on file  . Food insecurity:    Worry: Not on file    Inability: Not on file  . Transportation needs:    Medical: Not on file    Non-medical: Not on file  Tobacco Use  . Smoking status: Never Smoker  . Smokeless tobacco: Never Used  Substance and Sexual Activity  . Alcohol use: No  . Drug use: No  . Sexual activity: Not on file  Lifestyle  . Physical activity:    Days per week: Not on file    Minutes per session: Not on file  . Stress: Not on file  Relationships  . Social connections:    Talks on phone: Not on file    Gets together: Not on file    Attends religious service: Not on file    Active member of club or  organization: Not on file    Attends meetings of clubs or organizations: Not on file    Relationship status: Not on file  . Intimate partner violence:    Fear of current or ex partner: Not on file    Emotionally abused: Not on file    Physically abused: Not on file    Forced sexual activity: Not on file  Other Topics Concern  . Not on file  Social History Narrative   Regular exercise-yes   Caffeine use-yes          FAMILY HISTORY: Family History  Problem Relation Age of Onset  . Cancer Mother 41        Breast Cancer-Mastectomy, Also Abdominal cancer  . Asthma Father   . Diabetes Father     ALLERGIES:  has No Known Allergies.  MEDICATIONS:  Current Outpatient Medications  Medication Sig Dispense Refill  . calcium carbonate (TUMS - DOSED IN MG ELEMENTAL CALCIUM) 500 MG chewable tablet Chew 1 tablet by mouth daily.    . hydrochlorothiazide (HYDRODIURIL) 25 MG tablet Take 1 tablet (25 mg total) by mouth daily. 90 tablet 1   No current facility-administered medications for this visit.      PHYSICAL EXAMINATION: ECOG PERFORMANCE STATUS: 0 - Asymptomatic Vitals:   03/14/18 0930  BP: 133/90  Pulse: 65  Resp: 18  Temp: 97.6 F (36.4 C)   Filed Weights   03/14/18 0930  Weight: 202 lb 4.8 oz (91.8 kg)    Physical Exam Constitutional:      General: She is not in acute distress. HENT:     Head: Normocephalic and atraumatic.  Eyes:     General: No scleral icterus.    Pupils: Pupils are equal, round, and reactive to light.  Neck:     Musculoskeletal: Normal range of motion and neck supple.  Cardiovascular:     Rate and Rhythm: Normal rate and regular rhythm.     Heart sounds: Normal heart sounds.  Pulmonary:     Effort: Pulmonary effort is normal. No respiratory distress.     Breath sounds: No wheezing.  Abdominal:     General: Bowel sounds are normal. There is no distension.     Palpations: Abdomen is soft. There is no mass.     Tenderness: There is no abdominal tenderness.  Musculoskeletal: Normal range of motion.        General: No deformity.  Skin:    General: Skin is warm and dry.     Findings: No erythema or rash.  Neurological:     Mental Status: She is alert and oriented to person, place, and time.     Cranial Nerves: No cranial nerve deficit.     Coordination: Coordination normal.  Psychiatric:        Behavior: Behavior normal.        Thought Content: Thought content normal.      LABORATORY DATA:  I have reviewed the data as listed Lab Results   Component Value Date   WBC 7.3 02/19/2018   HGB 14.8 02/19/2018   HCT 43.1 02/19/2018   MCV 95.7 02/19/2018   PLT 294.0 02/19/2018   Recent Labs    08/09/17 0931 02/19/18 0830  NA 139 139  K 3.9 3.9  CL 101 102  CO2 32 30  GLUCOSE 104* 107*  BUN 13 15  CREATININE 0.97 0.91  CALCIUM 9.4 9.4  PROT 7.1 6.9  ALBUMIN 4.1 4.1  AST 20 26  ALT 19 26  ALKPHOS 58 57  BILITOT 0.8 0.8   Iron/TIBC/Ferritin/ %Sat    Component Value Date/Time   IRON 160 02/19/2018 0830   IRON 130 12/16/2011 1459   TIBC 243 (L) 02/19/2018 0830   TIBC 226 (L) 12/16/2011 1459   FERRITIN 623 (H) 02/19/2018 0830   FERRITIN 367 12/16/2011 1459   IRONPCTSAT 66 (H) 02/19/2018 0830     RADIOGRAPHIC STUDIES: I have personally reviewed the radiological images as listed and agreed with the findings in the report.  10/06/2017 Mammogram Negative, recommend annual screening mammogram.   ASSESSMENT & PLAN:  1. Iron overload    Labs were reviewed and discussed with patient.  She has iron overload, suspecting hereditary hemachromatosis.  Check hemachromatosis mutation for confirmation. Check US abdomen RUQ.  Plan phlebotomy 51ml every 2 weeks x 2.  Follow up in 6 weeks.   Orders Placed This Encounter  Procedures  . US Abdomen Limited    Standing Status:   Future    Standing Expiration Date:   03/14/2019    Order Specific Question:   Reason for Exam (SYMPTOM  OR DIAGNOSIS REQUIRED)    Answer:   RUQ, iron overload.    Order Specific Question:   Preferred imaging location?    Answer:   [facility]  . Hemochromatosis DNA-PCR(c282y,h63d)    Standing Status:   Future    Number of Occurrences:   1    Standing Expiration Date:   03/14/2019  . Iron and TIBC    Standing Status:   Future    Standing Expiration Date:   03/15/2019  . CBC with Differential/Platelet    Standing Status:   Future    Standing Expiration Date:   03/15/2019  . Ferritin    Standing Status:   Future    Standing Expiration  Date:   03/15/2019    All questions were answered. The patient knows to call the clinic with any problems questions or concerns.  Return of visit: Mid Febuary  Thank you for this kind referral and the opportunity to participate in the care of this patient. A copy of today's note is routed to referring provider  Total face to face encounter time for this patient visit was 45 min. >50% of the time was  spent in counseling and coordination of care.    Earlie Server, MD, PhD Hematology Oncology [facility] at [facility] [address] Pager- [phone] 03/14/2018

## 2018-03-16 ENCOUNTER — Ambulatory Visit
Admission: RE | Admit: 2018-03-16 | Discharge: 2018-03-16 | Disposition: A | Discharge status: Home / Self Care | Payer: Private Health Insurance - Indemnity | Source: Ambulatory Visit | Attending: Oncology | Admitting: Oncology

## 2018-03-16 DIAGNOSIS — K7689 Other specified diseases of liver: Secondary | ICD-10-CM | POA: Diagnosis not present

## 2018-03-19 ENCOUNTER — Other Ambulatory Visit: Payer: Self-pay

## 2018-03-19 LAB — HEMOCHROMATOSIS DNA-PCR(C282Y,H63D)

## 2018-03-22 ENCOUNTER — Encounter: Payer: Self-pay | Admitting: Oncology

## 2018-03-22 ENCOUNTER — Other Ambulatory Visit: Payer: Self-pay | Admitting: Oncology

## 2018-03-22 HISTORY — DX: Hereditary hemochromatosis: E83.110

## 2018-03-23 ENCOUNTER — Encounter: Payer: Self-pay | Admitting: Oncology

## 2018-03-27 ENCOUNTER — Other Ambulatory Visit: Payer: Self-pay | Admitting: Oncology

## 2018-03-27 ENCOUNTER — Inpatient Hospital Stay: Payer: Private Health Insurance - Indemnity

## 2018-03-27 LAB — HEMOGLOBIN AND HEMATOCRIT, BLOOD
HCT: 40.8 % (ref 36.0–46.0)
Hemoglobin: 13.8 g/dL (ref 12.0–15.0)

## 2018-03-27 MED ORDER — SODIUM CHLORIDE 0.9 % IV SOLN
Freq: Once | INTRAVENOUS | Status: AC
  Administered 2018-03-27: 15:00:00 via INTRAVENOUS
  Filled 2018-03-27: qty 250

## 2018-03-27 NOTE — Progress Notes (Signed)
450cc of blood removed when pt began feeling very lightheaded. Pt improved once IVF started and VSS on d/c. Janeann Merl, RN notified and stated she would let Dr. Tasia Catchings know.

## 2018-04-04 ENCOUNTER — Inpatient Hospital Stay: Payer: Private Health Insurance - Indemnity

## 2018-04-05 ENCOUNTER — Other Ambulatory Visit: Payer: Private Health Insurance - Indemnity

## 2018-04-06 ENCOUNTER — Inpatient Hospital Stay: Payer: Private Health Insurance - Indemnity | Attending: Oncology

## 2018-04-06 ENCOUNTER — Inpatient Hospital Stay: Payer: Private Health Insurance - Indemnity

## 2018-04-06 LAB — HEMOGLOBIN AND HEMATOCRIT, BLOOD
HCT: 38.3 % (ref 36.0–46.0)
Hemoglobin: 13 g/dL (ref 12.0–15.0)

## 2018-04-13 ENCOUNTER — Inpatient Hospital Stay: Payer: Private Health Insurance - Indemnity

## 2018-04-13 LAB — HEMOGLOBIN AND HEMATOCRIT, BLOOD
HCT: 37.8 % (ref 36.0–46.0)
Hemoglobin: 12.7 g/dL (ref 12.0–15.0)

## 2018-04-23 ENCOUNTER — Inpatient Hospital Stay: Payer: Private Health Insurance - Indemnity

## 2018-04-23 ENCOUNTER — Other Ambulatory Visit: Payer: Private Health Insurance - Indemnity

## 2018-04-23 LAB — HEMOGLOBIN AND HEMATOCRIT, BLOOD
HCT: 39.3 % (ref 36.0–46.0)
Hemoglobin: 13 g/dL (ref 12.0–15.0)

## 2018-05-01 ENCOUNTER — Encounter: Payer: Self-pay | Admitting: Gastroenterology

## 2018-05-01 ENCOUNTER — Telehealth: Payer: Self-pay

## 2018-05-01 ENCOUNTER — Ambulatory Visit (INDEPENDENT_AMBULATORY_CARE_PROVIDER_SITE_OTHER): Payer: Private Health Insurance - Indemnity | Admitting: Gastroenterology

## 2018-05-01 NOTE — Telephone Encounter (Signed)
Called to inform pt of her ultrasound appointment scheduled on 05-04-18 at 8:15 am at the [facility]. Pt should be fasting, no food or drink after midnight the night before the exam. Unable to contact LVM to return call

## 2018-05-01 NOTE — Progress Notes (Signed)
[name] MD, MRCP(U.K) [address]  [place]  [place], [place] [address]  Main: [phone]  Fax: [phone]   Gastroenterology Consultation  Referring Provider:     Crecencio Mc, MD Primary Care Physician:  Crecencio Mc, MD Primary Gastroenterologist:  Dr. [name]  Reason for Consultation:     Hemochromotosis         HPI:   Rhonda Brock is a 63 y.o. y/o female referred for consultation & management  by Dr. Derrel Nip, Aris Everts, MD.    She has been referred to see me for hemochromatosis. Genetic testing shows she has two copies of C282Y mutation. 02/2018 : RUQ USG shows diffusely echogenic liver.  She says years back was told her iron was "high", , was not diagnosed with HH, this year received routine lab work and then referred.   No joint pains, no increased pigmentation of skin, does not eat red meat. Not much vitamin C in diet , no OTC iron supplements, Takes TUMS at night .   Hepatic Function Latest Ref Rng & Units 02/19/2018 08/09/2017 02/09/2017  Total Protein 6.0 - 8.3 g/dL 6.9 7.1 7.0  Albumin 3.5 - 5.2 g/dL 4.1 4.1 4.1  AST 0 - 37 U/L '26 20 21  ' ALT 0 - 35 U/L '26 19 18  ' Alk Phosphatase 39 - 117 U/L 57 58 65  Total Bilirubin 0.2 - 1.2 mg/dL 0.8 0.8 0.8   CBC Latest Ref Rng & Units 04/23/2018 04/13/2018 04/06/2018  WBC 4.0 - 10.5 K/uL - - -  Hemoglobin 12.0 - 15.0 g/dL 13.0 12.7 13.0  Hematocrit 36.0 - 46.0 % 39.3 37.8 38.3  Platelets 150.0 - 400.0 K/uL - - -    Iron/TIBC/Ferritin/ %Sat    Component Value Date/Time   IRON 160 02/19/2018 0830   IRON 130 12/16/2011 1459   TIBC 243 (L) 02/19/2018 0830   TIBC 226 (L) 12/16/2011 1459   FERRITIN 623 (H) 02/19/2018 0830   FERRITIN 367 12/16/2011 1459   IRONPCTSAT 66 (H) 02/19/2018 0830    Alcohol: once a year  Past Medical History:  Diagnosis Date  . Anxiety   . Back pain   . Chronic headaches   . Cyst (solitary) of breast    bilaterally  . GERD (gastroesophageal reflux disease)   .  Hemochromatosis, hereditary ([place]) 03/22/2018  . Hypertension     Past Surgical History:  Procedure Laterality Date  . ABDOMINAL HYSTERECTOMY  2001  . APPENDECTOMY  1962  . breast cysts    . BREAST SURGERY  2004, 2005   left breast cyst drained x 2  . COLONOSCOPY  06/08/2006  . COLONOSCOPY WITH PROPOFOL N/A 06/29/2016   Procedure: COLONOSCOPY WITH PROPOFOL;  Surgeon: Robert Bellow, MD;  [facility] ENDOSCOPY;  Service: Endoscopy;  Laterality: N/A;  . TONSILLECTOMY  1970    Prior to Admission medications   Medication Sig Start Date End Date Taking? Authorizing Provider  calcium carbonate (TUMS - DOSED IN MG ELEMENTAL CALCIUM) 500 MG chewable tablet Chew 1 tablet by mouth daily.   Yes [provider]  hydrochlorothiazide (HYDRODIURIL) 25 MG tablet Take 1 tablet (25 mg total) by mouth daily. 02/21/18  Yes Crecencio Mc, MD    Family History  Problem Relation Age of Onset  . Cancer Mother 56       Breast Cancer-Mastectomy, Also Abdominal cancer  . Asthma Father   . Diabetes Father      Social History   Tobacco  Use  . Smoking status: Never Smoker  . Smokeless tobacco: Never Used  Substance Use Topics  . Alcohol use: No  . Drug use: No    Allergies as of 05/01/2018  . (No Known Allergies)    Review of Systems:    All systems reviewed and negative except where noted in HPI.   Physical Exam:  BP 118/73   Pulse 82   Ht '5\' 5"'  (1.651 m)   Wt 200 lb 3.2 oz (90.8 kg)   BMI 33.32 kg/m  No LMP recorded. Patient has had a hysterectomy. Psych:  Alert and cooperative. Normal mood and affect. General:   Alert,  Well-developed, well-nourished, pleasant and cooperative in NAD Head:  Normocephalic and atraumatic. Eyes:  Sclera clear, no icterus.   Conjunctiva pink. Ears:  Normal auditory acuity. Nose:  No deformity, discharge, or lesions. Mouth:  No deformity or lesions,oropharynx pink & moist. Neck:  Supple; no masses or thyromegaly. Lungs:  Respirations even  and unlabored.  Clear throughout to auscultation.   No wheezes, crackles, or rhonchi. No acute distress. Heart:  Regular rate and rhythm; no murmurs, clicks, rubs, or gallops. Abdomen:  Normal bowel sounds.  No bruits.  Soft, non-tender and non-distended without masses, hepatosplenomegaly or hernias noted.  No guarding or rebound tenderness.    Msk:  Symmetrical without gross deformities. Good, equal movement & strength bilaterally. Pulses:  Normal pulses noted. Extremities:  No clubbing or edema.  No cyanosis. Neurologic:  Alert and oriented x3;  grossly normal neurologically. Skin:  Intact without significant lesions or rashes. No jaundice. Lymph Nodes:  No significant cervical adenopathy. Psych:  Alert and cooperative. Normal mood and affect.  Imaging Studies: No results found.  Assessment and Plan:   Rhonda Brock is a 63 y.o. y/o female has been referred for a new diagnosis of homozygous C2H2Y hemochromatosis.   Plan  1. Screen for hepatitis A/B/C 2. Check TSH 3. She has no brother or sisters or kids for screening  4. Avoid/limit alcohol intake ,  5. Liver elastrography to determine if she has significant fibrosis 6. DEXA scan to screen for osteoporosis 7. Avoid multivitamins and Vitamin C supplements  8. Avoid uncooked sea food.  9. Check LFT's every 6 months  10.Patient information on Hemochromotosis.  11. Since Ferritin <1000 and has normal LFT's continue Phelobotomy with Dr Tasia Catchings and no reason for a liver biopsy per AASLD guidelines 2011  Follow up in 6 months   Dr [name] MD,MRCP(U.K)

## 2018-05-01 NOTE — Patient Instructions (Signed)
Hemochromatosis  Hemochromatosis is a condition in which the body stores too much iron. This is also called iron storage disease or iron overload disorder. The extra iron builds up in your joints, heart, liver, pancreas, and other organs, where it can cause damage.  There are two forms of this condition; they include:   Hereditary hemochromatosis. Defects (mutations) on certain genes can cause symptoms to develop. With this type of the condition, the body absorbs more iron than it needs from the foods you eat.   Secondary hemochromatosis. With this type of the condition, iron builds up in the body due to other reasons, such as from liver disease or blood transfusions.  What are the causes?  This condition may be caused by:   Abnormal genes passed down from both parents (inherited).   Receiving blood from a donor (blood transfusion).   Problems with the way the body uses iron in the bone marrow (ineffective erythropoiesis).   Having chronic liver disease, such as hepatitis or liver cancer.  What increases the risk?  You are more likely to develop this condition if you:   Inherit certain abnormal gene mutations from both parents.   Are white (Caucasian).   Have severe or long-term (chronic) anemia.  What are the signs or symptoms?  Signs and symptoms can start at any age, but they usually start in middle age. They may include:   Fatigue.   Weakness.   Joint pain and stiffness.   Abdominal pain.   Weight loss.   Skin turning a gray or bronze color.   Loss of interest in sex.   Loss of menstrual periods, in women.   Loss of body hair.   Shortness of breath.  As hemochromatosis gets worse, it may damage the liver, heart, or pancreas. This may lead to complications such as:   Diabetes.   Liver cancer.   Abnormal heart rhythms.   Heart failure.  How is this diagnosed?  This condition may be diagnosed based on:   Your symptoms and medical history.   A physical exam.   Blood tests.   Genetic  testing.   Removal and testing of a sample of liver tissue (liver biopsy).  How is this treated?  This condition is most often treated with:   Therapeutic phlebotomy. In this procedure, some of your blood is removed periodically in order to reduce the amount of iron in your body. Over time, your body will naturally replace the blood cells that you lose during phlebotomy. At the start of treatment, you may have a unit of blood removed once or twice a week. You will have blood tests during this time to determine when your iron levels return to normal. Once your iron levels are normal, you may only need to have a phlebotomy every few months.   Lifestyle changes. This may include not drinking alcohol and limiting certain items in your diet.   Medicines to remove excess iron (chelation therapy).   Medicines to help treat any related conditions.   Genetic counseling. If you are found to have a gene mutation, other family members may need to be tested for hereditary hemochromatosis.  Follow these instructions at home:  Alcohol use     If you have liver damage, do not drink alcohol.   If you do not have liver damage:  ? Do not drink alcohol if:   Your health care provider tells you not to drink.   You are pregnant, may be pregnant, or are   planning to become pregnant.  ? If you drink alcohol, limit how much you have:   0-1 drink a day for women.   0-2 drinks a day for men.  ? Be aware of how much alcohol is in your drink. In the [place] one drink equals one typical bottle of beer (12 oz), one-half glass of wine (5 oz), or one shot of hard liquor (1 oz).  General instructions   Stay active. Exercise for at least 30 minutes on most days of the week.   Take over-the-counter and prescription medicines only as told by your health care provider. This includes vitamins and supplements.   You may need to have frequent blood or urine testing to monitor your condition and to look for complications.   Follow any  instructions as directed by your health care provider regarding dietary restrictions.   Do not:  ? Take vitamins or supplements that contain iron.  ? Take vitamin C supplements. Vitamin C makes your body absorb more iron from foods.  ? Eat raw shellfish or raw fish. Hemochromatosis may increase your chance for infection from these foods.   Keep all follow-up visits as told by your health care provider. This is important.  Contact a health care provider if you have:   Fatigue.   Unusual weakness.   Shortness of breath.   Joint pain.   Abdominal pain.   Weight loss.  Get help right away if you have:   Chest pain.   Trouble breathing.  Summary   Hemochromatosis is a condition in which your body stores too much iron. This is also called iron storage disease or iron overload disorder.   The extra iron builds up in your joints, heart, liver, pancreas, and other organs, where it can cause damage.   Signs and symptoms can start at any age, but they usually start in middle age.   To treat this condition, you will need to have some of your blood removed periodically (therapeutic phlebotomy). Removing some of your blood also removes iron from your body.   You may need to have frequent blood or urine testing to monitor your condition and to look for complications.  This information is not intended to replace advice given to you by your health care provider. Make sure you discuss any questions you have with your health care provider.  Document Released: 03/11/2000 Document Revised: 03/22/2017 Document Reviewed: 03/22/2017  Elsevier Interactive Patient Education  2019 [employer]

## 2018-05-02 ENCOUNTER — Other Ambulatory Visit: Payer: Self-pay

## 2018-05-02 LAB — HEPATITIS A ANTIBODY, TOTAL: Hep A Total Ab: POSITIVE — AB

## 2018-05-02 LAB — TSH+T4F+T3FREE
Free T4: 1.19 ng/dL (ref 0.82–1.77)
T3, Free: 2.9 pg/mL (ref 2.0–4.4)
TSH: 1.84 u[IU]/mL (ref 0.450–4.500)

## 2018-05-02 LAB — HEPATITIS B CORE ANTIBODY, TOTAL: Hep B Core Total Ab: NEGATIVE

## 2018-05-02 LAB — HEPATITIS B SURFACE ANTIGEN: Hepatitis B Surface Ag: NEGATIVE

## 2018-05-02 LAB — HEPATITIS B E ANTIGEN: HEP B E AG: NEGATIVE

## 2018-05-02 LAB — HEPATITIS B SURFACE ANTIBODY,QUALITATIVE: HEP B SURFACE AB, QUAL: NONREACTIVE

## 2018-05-02 LAB — HEPATITIS C ANTIBODY: Hep C Virus Ab: <0.1 s/co ratio (ref 0.0–0.9)

## 2018-05-02 LAB — HEPATITIS B E ANTIBODY: HEP B E AB: NEGATIVE

## 2018-05-03 ENCOUNTER — Encounter: Payer: Self-pay | Admitting: Gastroenterology

## 2018-05-04 ENCOUNTER — Ambulatory Visit
Admission: RE | Admit: 2018-05-04 | Discharge: 2018-05-04 | Disposition: A | Discharge status: Home / Self Care | Payer: Private Health Insurance - Indemnity | Source: Ambulatory Visit | Attending: Gastroenterology | Admitting: Gastroenterology

## 2018-05-04 ENCOUNTER — Encounter: Payer: Self-pay | Admitting: Gastroenterology

## 2018-05-07 ENCOUNTER — Ambulatory Visit
Admission: RE | Admit: 2018-05-07 | Discharge: 2018-05-07 | Disposition: A | Discharge status: Home / Self Care | Payer: Private Health Insurance - Indemnity | Source: Ambulatory Visit | Attending: Gastroenterology | Admitting: Gastroenterology

## 2018-05-07 ENCOUNTER — Encounter: Payer: Self-pay | Admitting: Gastroenterology

## 2018-05-07 ENCOUNTER — Inpatient Hospital Stay: Payer: Private Health Insurance - Indemnity | Attending: Oncology

## 2018-05-07 DIAGNOSIS — Z79899 Other long term (current) drug therapy: Secondary | ICD-10-CM | POA: Insufficient documentation

## 2018-05-07 DIAGNOSIS — I10 Essential (primary) hypertension: Secondary | ICD-10-CM | POA: Diagnosis not present

## 2018-05-07 LAB — CBC WITH DIFFERENTIAL/PLATELET
Abs Immature Granulocytes: 0.07 10*3/uL (ref 0.00–0.07)
BASOS PCT: 1 %
Basophils Absolute: 0.1 10*3/uL (ref 0.0–0.1)
Eosinophils Absolute: 0.4 10*3/uL (ref 0.0–0.5)
Eosinophils Relative: 4 %
HCT: 37.7 % (ref 36.0–46.0)
Hemoglobin: 12.9 g/dL (ref 12.0–15.0)
Immature Granulocytes: 1 %
Lymphocytes Relative: 30 %
Lymphs Abs: 2.6 10*3/uL (ref 0.7–4.0)
MCH: 33.1 pg (ref 26.0–34.0)
MCHC: 34.2 g/dL (ref 30.0–36.0)
MCV: 96.7 fL (ref 80.0–100.0)
Monocytes Absolute: 0.7 10*3/uL (ref 0.1–1.0)
Monocytes Relative: 7 %
Neutro Abs: 5 10*3/uL (ref 1.7–7.7)
Neutrophils Relative %: 57 %
Platelets: 314 10*3/uL (ref 150–400)
RBC: 3.9 MIL/uL (ref 3.87–5.11)
RDW: 13 % (ref 11.5–15.5)
WBC: 8.8 10*3/uL (ref 4.0–10.5)
nRBC: 0 % (ref 0.0–0.2)

## 2018-05-07 LAB — IRON AND TIBC
Iron: 82 ug/dL (ref 28–170)
SATURATION RATIOS: 31 % (ref 10.4–31.8)
TIBC: 266 ug/dL (ref 250–450)
UIBC: 184 ug/dL

## 2018-05-07 LAB — FERRITIN: Ferritin: 280 ng/mL (ref 11–307)

## 2018-05-08 ENCOUNTER — Telehealth: Payer: Self-pay

## 2018-05-08 NOTE — Telephone Encounter (Signed)
-----   Message from [name], MD sent at 05/03/2018 10:57 AM EST ----- Needs hep B vaccine- rest of labs were normal

## 2018-05-08 NOTE — Telephone Encounter (Signed)
Spoke with pt and informed her of lab results and Dr. Georgeann Oppenheim suggestion for pt to receive the Hepatitis B vaccine. Pt plans to receive the vaccination from her PCP.

## 2018-05-09 ENCOUNTER — Inpatient Hospital Stay: Payer: Private Health Insurance - Indemnity

## 2018-05-09 ENCOUNTER — Encounter: Payer: Self-pay | Admitting: Oncology

## 2018-05-09 ENCOUNTER — Other Ambulatory Visit: Payer: Self-pay

## 2018-05-09 ENCOUNTER — Inpatient Hospital Stay (HOSPITAL_BASED_OUTPATIENT_CLINIC_OR_DEPARTMENT_OTHER): Payer: Private Health Insurance - Indemnity | Admitting: Oncology

## 2018-05-09 DIAGNOSIS — I10 Essential (primary) hypertension: Secondary | ICD-10-CM | POA: Diagnosis not present

## 2018-05-09 DIAGNOSIS — Z79899 Other long term (current) drug therapy: Secondary | ICD-10-CM | POA: Diagnosis not present

## 2018-05-09 NOTE — Progress Notes (Signed)
Hematology/Oncology follow up note [facility] Telephone:([phone]) [phone] Fax:([phone]) [phone]   Patient Care Team: Crecencio Mc, MD as PCP - General (Internal Medicine) Crecencio Mc, MD (Internal Medicine) Bary Castilla Forest Gleason, MD (General Surgery)  REFERRING PROVIDER: Crecencio Mc, MD  CHIEF COMPLAINTS/REASON FOR VISIT:  Patient presented to follow-up for management of hemochromatosis and iron overload.  HISTORY OF PRESENTING ILLNESS:  Rhonda Brock is a  63 y.o.  female with PMH listed below who was referred to me for evaluation of ferritin level Patient has labs done recently which showed elevated ferritin at 623, iron saturation at 66.  Recent liver function tests showed   Context History of previous blood transfusion: denies Shortness of breath: denies Join pain: denies  Fatigue: denies  Reviewed patient's previous labs, patient has labs done on 12/08/2011 which showed elevated iron saturation at 63, ferritin level of 332.8.  Labs on 06/30/2014 iron saturation 60, ferritin was not tested.   INTERVAL HISTORY Rhonda Brock is a 63 y.o. female who has above history reviewed by me today presents for follow up visit for management of hereditary hemochromatosis with iron overload. Problems and complaints are listed below: During the interval, patient has had lab work-up done which confirmed hereditary hemochromatosis, homozygous C282Y mutation. Patient underwent phlebotomy.  During 1 of her phlebotomy session, after 450 cc of blood removed patient began feeling very lightheaded.  Improved after IV fluid.  Phlebotomy volume has been decreased to 300 cc per session.  And patient tolerated well. She reports chronic fatigue has slightly better.  Otherwise no new complaints.  She has followed up with gastroenterologist and has received hepatitis vaccination.  Review of Systems  Constitutional: Negative for appetite change, chills, fatigue and fever.  HENT:    Negative for hearing loss and voice change.   Eyes: Negative for eye problems.  Respiratory: Negative for chest tightness and cough.   Cardiovascular: Negative for chest pain.  Gastrointestinal: Negative for abdominal distention, abdominal pain and blood in stool.  Endocrine: Negative for hot flashes.  Genitourinary: Negative for difficulty urinating and frequency.   Musculoskeletal: Negative for arthralgias.  Skin: Negative for itching and rash.  Neurological: Negative for extremity weakness.  Hematological: Negative for adenopathy.  Psychiatric/Behavioral: Negative for confusion.    MEDICAL HISTORY:  Past Medical History:  Diagnosis Date  . Anxiety   . Back pain   . Chronic headaches   . Cyst (solitary) of breast    bilaterally  . GERD (gastroesophageal reflux disease)   . Hemochromatosis, hereditary ([place]) 03/22/2018  . Hypertension     SURGICAL HISTORY: Past Surgical History:  Procedure Laterality Date  . ABDOMINAL HYSTERECTOMY  2001  . APPENDECTOMY  1962  . breast cysts    . BREAST SURGERY  2004, 2005   left breast cyst drained x 2  . COLONOSCOPY  06/08/2006  . COLONOSCOPY WITH PROPOFOL N/A 06/29/2016   Procedure: COLONOSCOPY WITH PROPOFOL;  Surgeon: Robert Bellow, MD;  Location: [facility] ENDOSCOPY;  Service: Endoscopy;  Laterality: N/A;  . TONSILLECTOMY  1970    SOCIAL HISTORY: Social History   Socioeconomic History  . Marital status: Married    Spouse name: Not on file  . Number of children: Not on file  . Years of education: 6  . Highest education level: Not on file  Occupational History  . Occupation: Agricultural engineer  Social Needs  . Financial resource strain: Not on file  . Food insecurity:    Worry: Not on file  Inability: Not on file  . Transportation needs:    Medical: Not on file    Non-medical: Not on file  Tobacco Use  . Smoking status: Never Smoker  . Smokeless tobacco: Never Used  Substance and Sexual Activity  . Alcohol use: No  . Drug use:  No  . Sexual activity: Not on file  Lifestyle  . Physical activity:    Days per week: Not on file    Minutes per session: Not on file  . Stress: Not on file  Relationships  . Social connections:    Talks on phone: Not on file    Gets together: Not on file    Attends religious service: Not on file    Active member of club or organization: Not on file    Attends meetings of clubs or organizations: Not on file    Relationship status: Not on file  . Intimate partner violence:    Fear of current or ex partner: Not on file    Emotionally abused: Not on file    Physically abused: Not on file    Forced sexual activity: Not on file  Other Topics Concern  . Not on file  Social History Narrative   Regular exercise-yes   Caffeine use-yes          FAMILY HISTORY: Family History  Problem Relation Age of Onset  . Cancer Mother 10       Breast Cancer-Mastectomy, Also Abdominal cancer  . Asthma Father   . Diabetes Father     ALLERGIES:  has No Known Allergies.  MEDICATIONS:  Current Outpatient Medications  Medication Sig Dispense Refill  . calcium carbonate (TUMS - DOSED IN MG ELEMENTAL CALCIUM) 500 MG chewable tablet Chew 1 tablet by mouth daily.    . hydrochlorothiazide (HYDRODIURIL) 25 MG tablet Take 1 tablet (25 mg total) by mouth daily. 90 tablet 1   No current facility-administered medications for this visit.      PHYSICAL EXAMINATION: ECOG PERFORMANCE STATUS: 0 - Asymptomatic Vitals:   05/09/18 1330  BP: (!) 136/92  Pulse: 79  Resp: 18  Temp: 97.6 F (36.4 C)   Filed Weights   05/09/18 1330  Weight: 201 lb (91.2 kg)    Physical Exam Constitutional:      General: She is not in acute distress. HENT:     Head: Normocephalic and atraumatic.  Eyes:     General: No scleral icterus.    Pupils: Pupils are equal, round, and reactive to light.  Neck:     Musculoskeletal: Normal range of motion and neck supple.  Cardiovascular:     Rate and Rhythm: Normal rate  and regular rhythm.     Heart sounds: Normal heart sounds.  Pulmonary:     Effort: Pulmonary effort is normal. No respiratory distress.     Breath sounds: No wheezing.  Abdominal:     General: Bowel sounds are normal. There is no distension.     Palpations: Abdomen is soft. There is no mass.     Tenderness: There is no abdominal tenderness.  Musculoskeletal: Normal range of motion.        General: No deformity.  Skin:    General: Skin is warm and dry.     Findings: No erythema or rash.  Neurological:     Mental Status: She is alert and oriented to person, place, and time.     Cranial Nerves: No cranial nerve deficit.     Coordination: Coordination normal.  Psychiatric:  Behavior: Behavior normal.        Thought Content: Thought content normal.      LABORATORY DATA:  I have reviewed the data as listed Lab Results  Component Value Date   WBC 8.8 05/07/2018   HGB 12.9 05/07/2018   HCT 37.7 05/07/2018   MCV 96.7 05/07/2018   PLT 314 05/07/2018   Recent Labs    08/09/17 0931 02/19/18 0830  NA 139 139  K 3.9 3.9  CL 101 102  CO2 32 30  GLUCOSE 104* 107*  BUN 13 15  CREATININE 0.97 0.91  CALCIUM 9.4 9.4  PROT 7.1 6.9  ALBUMIN 4.1 4.1  AST 20 26  ALT 19 26  ALKPHOS 58 57  BILITOT 0.8 0.8   Iron/TIBC/Ferritin/ %Sat    Component Value Date/Time   IRON 82 05/07/2018 1110   IRON 130 12/16/2011 1459   TIBC 266 05/07/2018 1110   TIBC 226 (L) 12/16/2011 1459   FERRITIN 280 05/07/2018 1110   FERRITIN 367 12/16/2011 1459   IRONPCTSAT 31 05/07/2018 1110   IRONPCTSAT 66 (H) 02/19/2018 0830     RADIOGRAPHIC STUDIES: I have personally reviewed the radiological images as listed and agreed with the findings in the report.  10/06/2017 Mammogram Negative, recommend annual screening mammogram.   ASSESSMENT & PLAN:  1. Hemochromatosis, hereditary ([place])   2. Iron overload    Labs are reviewed and discussed with patient. Hemoglobin remained stable.  She is not  anemic after 4 sessions of phlebotomy. Iron panel showed ferritin decreased from 623 04/29/1978.  Iron saturation increased from 66 to 31. Liver function is normal. Recommend continue phlebotomy every 2 weeks x5, 300 cc each session..  Goal ferritin is less than 50.  Diagnosis of hereditary hematosis with homozygous C282Y mutation discussed with patient. Recommend first-degree relatives being screened.  Patient reports that she does not have any children or siblings. Recommend avoid alcohol and uncooked seafood. Continue follow-up with gastroenterology.  #Abdominal ultrasound right upper quadrant was independent reviewed by me and discussed with patient. Diffusely echogenic liver.  Otherwise normal examination.  Orders Placed This Encounter  Procedures  . CBC with Differential/Platelet    Standing Status:   Future    Standing Expiration Date:   05/11/2019  . Iron and TIBC    Standing Status:   Future    Standing Expiration Date:   05/11/2019  . Ferritin    Standing Status:   Future    Standing Expiration Date:   05/11/2019  . Hepatic function panel    Standing Status:   Future    Standing Expiration Date:   05/10/2019    All questions were answered. The patient knows to call the clinic with any problems questions or concerns.  Return of visit: Follow-up in 10 weeks repeat labs CBC, iron, TIBC ferritin LFT prior to the visit.   Earlie Server, MD, PhD Hematology Oncology [facility] Pager- [phone] 05/09/2018

## 2018-05-09 NOTE — Progress Notes (Signed)
Patient here for follow up. Pt states she feels good.

## 2018-05-10 ENCOUNTER — Telehealth: Payer: Self-pay | Admitting: Oncology

## 2018-05-10 NOTE — Telephone Encounter (Signed)
Cancel 07/03/18 appts, per patient request. Patient does not wish to rschd at this time.

## 2018-05-15 ENCOUNTER — Other Ambulatory Visit: Payer: Self-pay

## 2018-05-15 MED ORDER — HYDROCHLOROTHIAZIDE 25 MG PO TABS: 25.0000 mg | ORAL_TABLET | Freq: Every day | ORAL | 1 refills | Status: DC

## 2018-05-17 ENCOUNTER — Ambulatory Visit (INDEPENDENT_AMBULATORY_CARE_PROVIDER_SITE_OTHER): Payer: Private Health Insurance - Indemnity

## 2018-05-17 DIAGNOSIS — Z23 Encounter for immunization: Secondary | ICD-10-CM | POA: Diagnosis not present

## 2018-05-17 NOTE — Progress Notes (Signed)
Pt was seen today for Hep B vaccination. Given IM in the LD. Pt tolerated well.

## 2018-05-21 ENCOUNTER — Inpatient Hospital Stay: Payer: Private Health Insurance - Indemnity

## 2018-06-05 ENCOUNTER — Other Ambulatory Visit: Payer: Private Health Insurance - Indemnity

## 2018-06-06 ENCOUNTER — Inpatient Hospital Stay: Payer: Private Health Insurance - Indemnity | Attending: Oncology

## 2018-06-06 ENCOUNTER — Other Ambulatory Visit: Payer: Self-pay

## 2018-06-06 ENCOUNTER — Inpatient Hospital Stay: Payer: Private Health Insurance - Indemnity

## 2018-06-06 LAB — HEMOGLOBIN AND HEMATOCRIT, BLOOD
HCT: 39.8 % (ref 36.0–46.0)
Hemoglobin: 13.8 g/dL (ref 12.0–15.0)

## 2018-06-19 ENCOUNTER — Other Ambulatory Visit: Payer: Self-pay

## 2018-06-19 ENCOUNTER — Ambulatory Visit (INDEPENDENT_AMBULATORY_CARE_PROVIDER_SITE_OTHER): Payer: Private Health Insurance - Indemnity

## 2018-06-19 DIAGNOSIS — Z23 Encounter for immunization: Secondary | ICD-10-CM | POA: Diagnosis not present

## 2018-06-19 NOTE — Progress Notes (Signed)
Rhonda Brock presents today for injection per MD orders.  2nd Hep B administered IM in right Upper Arm. Administration without incident. Patient tolerated well.   nin,cma

## 2018-06-27 ENCOUNTER — Telehealth: Payer: Self-pay | Admitting: *Deleted

## 2018-06-27 NOTE — Telephone Encounter (Signed)
Patient (Cancel 07/03/18 appts, per patient request. Patient does not wish to rschd at this time.) Patient said that she does plan on keeping her 4/24 and 07/23/18 appts as scheduled.

## 2018-07-03 ENCOUNTER — Other Ambulatory Visit: Payer: Private Health Insurance - Indemnity

## 2018-07-17 ENCOUNTER — Encounter: Payer: Self-pay | Admitting: Oncology

## 2018-07-20 ENCOUNTER — Inpatient Hospital Stay: Payer: Private Health Insurance - Indemnity | Attending: Oncology

## 2018-07-20 ENCOUNTER — Other Ambulatory Visit: Payer: Self-pay

## 2018-07-20 LAB — CBC WITH DIFFERENTIAL/PLATELET
Abs Immature Granulocytes: 0.06 10*3/uL (ref 0.00–0.07)
Basophils Absolute: 0.1 10*3/uL (ref 0.0–0.1)
Basophils Relative: 1 %
Eosinophils Absolute: 0.4 10*3/uL (ref 0.0–0.5)
Eosinophils Relative: 4 %
HCT: 41.4 % (ref 36.0–46.0)
Hemoglobin: 14 g/dL (ref 12.0–15.0)
Immature Granulocytes: 1 %
Lymphocytes Relative: 28 %
Lymphs Abs: 2.5 10*3/uL (ref 0.7–4.0)
MCH: 32.4 pg (ref 26.0–34.0)
MCHC: 33.8 g/dL (ref 30.0–36.0)
MCV: 95.8 fL (ref 80.0–100.0)
Monocytes Absolute: 0.7 10*3/uL (ref 0.1–1.0)
Monocytes Relative: 8 %
Neutro Abs: 5.3 10*3/uL (ref 1.7–7.7)
Neutrophils Relative %: 58 %
Platelets: 313 10*3/uL (ref 150–400)
RBC: 4.32 MIL/uL (ref 3.87–5.11)
RDW: 11.9 % (ref 11.5–15.5)
WBC: 9 10*3/uL (ref 4.0–10.5)
nRBC: 0 % (ref 0.0–0.2)

## 2018-07-20 LAB — HEPATIC FUNCTION PANEL
ALT: 18 U/L (ref 0–44)
AST: 19 U/L (ref 15–41)
Albumin: 4.1 g/dL (ref 3.5–5.0)
Alkaline Phosphatase: 70 U/L (ref 38–126)
Bilirubin, Direct: 0.1 mg/dL (ref 0.0–0.2)
Indirect Bilirubin: 0.4 mg/dL (ref 0.3–0.9)
Total Bilirubin: 0.5 mg/dL (ref 0.3–1.2)
Total Protein: 7.2 g/dL (ref 6.5–8.1)

## 2018-07-20 LAB — IRON AND TIBC
Iron: 92 ug/dL (ref 28–170)
Saturation Ratios: 35 % — ABNORMAL HIGH (ref 10.4–31.8)
TIBC: 265 ug/dL (ref 250–450)
UIBC: 173 ug/dL

## 2018-07-20 LAB — FERRITIN: Ferritin: 220 ng/mL (ref 11–307)

## 2018-07-23 ENCOUNTER — Other Ambulatory Visit: Payer: Private Health Insurance - Indemnity

## 2018-07-23 ENCOUNTER — Encounter: Payer: Self-pay | Admitting: Oncology

## 2018-07-23 ENCOUNTER — Other Ambulatory Visit: Payer: Self-pay

## 2018-07-23 ENCOUNTER — Inpatient Hospital Stay (HOSPITAL_BASED_OUTPATIENT_CLINIC_OR_DEPARTMENT_OTHER): Payer: Private Health Insurance - Indemnity | Admitting: Oncology

## 2018-07-23 ENCOUNTER — Inpatient Hospital Stay: Payer: Private Health Insurance - Indemnity

## 2018-07-23 NOTE — Progress Notes (Signed)
Called patient for televisit via [place].  Patient states no new concerns today.

## 2018-07-23 NOTE — Progress Notes (Signed)
HEMATOLOGY-ONCOLOGY TeleHEALTH VISIT PROGRESS NOTE  I connected with Rhonda Brock on 07/23/18 at 10:15 AM EDT by video enabled telemedicine visit and verified that I am speaking with the correct person using two identifiers. I discussed the limitations, risks, security and privacy concerns of performing an evaluation and management service by telemedicine and the availability of in-person appointments. I also discussed with the patient that there may be a patient responsible charge related to this service. The patient expressed understanding and agreed to proceed.   Other persons participating in the visit and their role in the encounter:  Geraldine Solar, [place], check in patient   Janeann Merl, RN, check in patient.   Patient's location: Home  Provider's location: home Chief Complaint: Follow-up for management of iron overload, homozygous hemochromatosis and evaluation for need of phlebotomy.   INTERVAL HISTORY Rhonda Brock is a 63 y.o. female who has above history reviewed by me today presents for follow up visit for management of  management of iron overload, homozygous hemochromatosis and evaluation for need of phlebotomy. Problems and complaints are listed below:  Patient phlebotomy 300.every 2 weeks since last visit.  She recalls missing 1 or 2 phlebotomy due to scheduling changes. Reports feeling well today.  Denies any dizziness, chest pain, shortness of breath, abdominal pain.  Tolerates phlebotomy well. Patient has establish care with Dr. Vicente Males who obtained ultrasound right upper quadrant with elastography on 05/04/2018 which showed hyperechoic hepatic parenchyma suggesting hepatocellular disease likely reflecting patient's known hemochromatosis.  No new complaints today.  He takes care of her husband who needs physical therapy for rehab Review of Systems  Constitutional: Negative for appetite change, chills, fatigue and fever.  HENT:   Negative for hearing loss and voice change.    Eyes: Negative for eye problems.  Respiratory: Negative for chest tightness and cough.   Cardiovascular: Negative for chest pain.  Gastrointestinal: Negative for abdominal distention, abdominal pain and blood in stool.  Endocrine: Negative for hot flashes.  Genitourinary: Negative for difficulty urinating and frequency.   Musculoskeletal: Negative for arthralgias.  Skin: Negative for itching and rash.  Neurological: Negative for extremity weakness.  Hematological: Negative for adenopathy.  Psychiatric/Behavioral: Negative for confusion.    Past Medical History:  Diagnosis Date  . Anxiety   . Back pain   . Chronic headaches   . Cyst (solitary) of breast    bilaterally  . GERD (gastroesophageal reflux disease)   . Hemochromatosis, hereditary ([place]) 03/22/2018  . Hypertension    Past Surgical History:  Procedure Laterality Date  . ABDOMINAL HYSTERECTOMY  2001  . APPENDECTOMY  1962  . breast cysts    . BREAST SURGERY  2004, 2005   left breast cyst drained x 2  . COLONOSCOPY  06/08/2006  . COLONOSCOPY WITH PROPOFOL N/A 06/29/2016   Procedure: COLONOSCOPY WITH PROPOFOL;  Surgeon: Robert Bellow, MD;  Location: [facility] ENDOSCOPY;  Service: Endoscopy;  Laterality: N/A;  . TONSILLECTOMY  1970    Family History  Problem Relation Age of Onset  . Cancer Mother 62       Breast Cancer-Mastectomy, Also Abdominal cancer  . Asthma Father   . Diabetes Father     Social History   Socioeconomic History  . Marital status: Married    Spouse name: Not on file  . Number of children: Not on file  . Years of education: 18  . Highest education level: Not on file  Occupational History  . Occupation: Agricultural engineer  Social Needs  . Emergency planning/management officer  strain: Not on file  . Food insecurity:    Worry: Not on file    Inability: Not on file  . Transportation needs:    Medical: Not on file    Non-medical: Not on file  Tobacco Use  . Smoking status: Never Smoker  . Smokeless tobacco: Never Used   Substance and Sexual Activity  . Alcohol use: No  . Drug use: No  . Sexual activity: Not on file  Lifestyle  . Physical activity:    Days per week: Not on file    Minutes per session: Not on file  . Stress: Not on file  Relationships  . Social connections:    Talks on phone: Not on file    Gets together: Not on file    Attends religious service: Not on file    Active member of club or organization: Not on file    Attends meetings of clubs or organizations: Not on file    Relationship status: Not on file  . Intimate partner violence:    Fear of current or ex partner: Not on file    Emotionally abused: Not on file    Physically abused: Not on file    Forced sexual activity: Not on file  Other Topics Concern  . Not on file  Social History Narrative   Regular exercise-yes   Caffeine use-yes          Current Outpatient Medications on File Prior to Visit  Medication Sig Dispense Refill  . calcium carbonate (TUMS - DOSED IN MG ELEMENTAL CALCIUM) 500 MG chewable tablet Chew 1 tablet by mouth daily.    . hydrochlorothiazide (HYDRODIURIL) 25 MG tablet Take 1 tablet (25 mg total) by mouth daily. 90 tablet 1   No current facility-administered medications on file prior to visit.     No Known Allergies     Observations/Objective: There were no vitals filed for this visit. There is no height or weight on file to calculate BMI.  Pain level 0 Physical Exam  Constitutional: She is oriented to person, place, and time and well-developed, well-nourished, and in no distress. No distress.  HENT:  Head: Normocephalic.  Neck: Normal range of motion.  Pulmonary/Chest: Effort normal. No respiratory distress.  Neurological: She is alert and oriented to person, place, and time.  Psychiatric: Affect normal.    CBC    Component Value Date/Time   WBC 9.0 07/20/2018 1122   RBC 4.32 07/20/2018 1122   HGB 14.0 07/20/2018 1122   HGB 13.4 12/16/2011 1459   HCT 41.4 07/20/2018 1122   HCT  40.1 12/16/2011 1459   PLT 313 07/20/2018 1122   PLT 261 12/16/2011 1459   MCV 95.8 07/20/2018 1122   MCV 96 12/16/2011 1459   MCH 32.4 07/20/2018 1122   MCHC 33.8 07/20/2018 1122   RDW 11.9 07/20/2018 1122   RDW 13.1 12/16/2011 1459   LYMPHSABS 2.5 07/20/2018 1122   LYMPHSABS 2.5 12/16/2011 1459   MONOABS 0.7 07/20/2018 1122   MONOABS 0.7 12/16/2011 1459   EOSABS 0.4 07/20/2018 1122   EOSABS 0.4 12/16/2011 1459   BASOSABS 0.1 07/20/2018 1122   BASOSABS 0.1 12/16/2011 1459    CMP     Component Value Date/Time   NA 139 02/19/2018 0830   K 3.9 02/19/2018 0830   CL 102 02/19/2018 0830   CO2 30 02/19/2018 0830   GLUCOSE 107 (H) 02/19/2018 0830   BUN 15 02/19/2018 0830   CREATININE 0.91 02/19/2018 0830   CALCIUM  9.4 02/19/2018 0830   PROT 7.2 07/20/2018 1122   ALBUMIN 4.1 07/20/2018 1122   AST 19 07/20/2018 1122   ALT 18 07/20/2018 1122   ALKPHOS 70 07/20/2018 1122   BILITOT 0.5 07/20/2018 1122     Assessment and Plan: 1. Hemochromatosis, hereditary ([place])   2. Iron overload   Labs are reviewed and discussed with patient. Ferritin decreased to 220.  Iron saturation slightly increased at 35. Patient does have ultrasound evidence of hemochromatosis involvement in liver. Since patient has been tolerating every 2 weeks phlebotomy, I recommend increase frequency of phlebotomy to weekly for faster reduction of iron level.  Goal ferritin is less than 50.  Patient expresses concerns of weekly phlebotomy schedule as she needs to take her husband to rehab facility. Currently she is okay with proceeding weekly phlebotomy for about 4 weeks.  Then we will proceed with phlebotomy every 2 weeks.  She will have H&H q. 2 weeks.  Follow Up Instructions: Lab MD 10 weeks with labs prior-CBC cmp FT ferritin iron TIBC- phlebotomy   I discussed the assessment and treatment plan with the patient. The patient was provided an opportunity to ask questions and all were answered. The patient agreed  with the plan and demonstrated an understanding of the instructions.  The patient was advised to call back or seek an in-person evaluation if the symptoms worsen or if the condition fails to improve as anticipated.  Earlie Server, MD 07/23/2018 3:38 PM

## 2018-07-30 ENCOUNTER — Inpatient Hospital Stay: Payer: Private Health Insurance - Indemnity | Attending: Oncology

## 2018-07-30 ENCOUNTER — Other Ambulatory Visit: Payer: Self-pay

## 2018-08-06 ENCOUNTER — Other Ambulatory Visit: Payer: Self-pay

## 2018-08-06 ENCOUNTER — Inpatient Hospital Stay: Payer: Private Health Insurance - Indemnity

## 2018-08-06 LAB — HEMOGLOBIN AND HEMATOCRIT, BLOOD
HCT: 36.8 % (ref 36.0–46.0)
Hemoglobin: 12.5 g/dL (ref 12.0–15.0)

## 2018-08-06 NOTE — Progress Notes (Signed)
Therapeutic phlebotomy performed per MD order starting at 1411 and ending at 1422 using 20 gauge PIV in rt AC removing 300cc. Pt tolerated procedure well.  Pt and VS stable at discharge.

## 2018-08-13 ENCOUNTER — Other Ambulatory Visit: Payer: Self-pay

## 2018-08-13 ENCOUNTER — Inpatient Hospital Stay: Payer: Private Health Insurance - Indemnity

## 2018-08-21 ENCOUNTER — Other Ambulatory Visit: Payer: Self-pay

## 2018-08-21 ENCOUNTER — Inpatient Hospital Stay: Payer: Private Health Insurance - Indemnity

## 2018-08-21 DIAGNOSIS — E876 Hypokalemia: Secondary | ICD-10-CM

## 2018-08-21 LAB — CBC WITH DIFFERENTIAL/PLATELET
Abs Immature Granulocytes: 0.1 10*3/uL — ABNORMAL HIGH (ref 0.00–0.07)
Basophils Absolute: 0.1 10*3/uL (ref 0.0–0.1)
Basophils Relative: 1 %
Eosinophils Absolute: 0.3 10*3/uL (ref 0.0–0.5)
Eosinophils Relative: 3 %
HCT: 36.9 % (ref 36.0–46.0)
Hemoglobin: 12.5 g/dL (ref 12.0–15.0)
Immature Granulocytes: 1 %
Lymphocytes Relative: 29 %
Lymphs Abs: 2.8 10*3/uL (ref 0.7–4.0)
MCH: 33.1 pg (ref 26.0–34.0)
MCHC: 33.9 g/dL (ref 30.0–36.0)
MCV: 97.6 fL (ref 80.0–100.0)
Monocytes Absolute: 0.9 10*3/uL (ref 0.1–1.0)
Monocytes Relative: 9 %
Neutro Abs: 5.5 10*3/uL (ref 1.7–7.7)
Neutrophils Relative %: 57 %
Platelets: 301 10*3/uL (ref 150–400)
RBC: 3.78 MIL/uL — ABNORMAL LOW (ref 3.87–5.11)
RDW: 13.2 % (ref 11.5–15.5)
WBC: 9.6 10*3/uL (ref 4.0–10.5)
nRBC: 0 % (ref 0.0–0.2)

## 2018-08-21 LAB — IRON AND TIBC
Iron: 81 ug/dL (ref 28–170)
Saturation Ratios: 28 % (ref 10.4–31.8)
TIBC: 290 ug/dL (ref 250–450)
UIBC: 209 ug/dL

## 2018-08-21 LAB — COMPREHENSIVE METABOLIC PANEL
ALT: 58 U/L — ABNORMAL HIGH (ref 0–44)
AST: 31 U/L (ref 15–41)
Albumin: 4 g/dL (ref 3.5–5.0)
Alkaline Phosphatase: 77 U/L (ref 38–126)
Anion gap: 9 (ref 5–15)
BUN: 20 mg/dL (ref 8–23)
CO2: 30 mmol/L (ref 22–32)
Calcium: 9.2 mg/dL (ref 8.9–10.3)
Chloride: 102 mmol/L (ref 98–111)
Creatinine, Ser: 0.86 mg/dL (ref 0.44–1.00)
GFR calc Af Amer: >60 mL/min (ref >60)
GFR calc non Af Amer: >60 mL/min (ref >60)
Glucose, Bld: 103 mg/dL — ABNORMAL HIGH (ref 70–99)
Potassium: 3.2 mmol/L — ABNORMAL LOW (ref 3.5–5.1)
Sodium: 141 mmol/L (ref 135–145)
Total Bilirubin: 0.6 mg/dL (ref 0.3–1.2)
Total Protein: 6.9 g/dL (ref 6.5–8.1)

## 2018-08-21 LAB — FERRITIN: Ferritin: 116 ng/mL (ref 11–307)

## 2018-08-21 MED ORDER — POTASSIUM CHLORIDE CRYS ER 20 MEQ PO TBCR: 20.0000 meq | EXTENDED_RELEASE_TABLET | Freq: Every day | ORAL | 0 refills | Status: DC

## 2018-08-28 ENCOUNTER — Other Ambulatory Visit: Payer: Self-pay | Admitting: Oncology

## 2018-08-28 DIAGNOSIS — E876 Hypokalemia: Secondary | ICD-10-CM

## 2018-09-03 ENCOUNTER — Inpatient Hospital Stay: Payer: Private Health Insurance - Indemnity | Attending: Oncology

## 2018-09-03 ENCOUNTER — Inpatient Hospital Stay: Payer: Private Health Insurance - Indemnity

## 2018-09-03 ENCOUNTER — Other Ambulatory Visit: Payer: Self-pay

## 2018-09-03 LAB — HEMOGLOBIN AND HEMATOCRIT, BLOOD
HCT: 38.9 % (ref 36.0–46.0)
Hemoglobin: 13.2 g/dL (ref 12.0–15.0)

## 2018-09-07 ENCOUNTER — Encounter: Payer: Self-pay | Admitting: Oncology

## 2018-09-17 ENCOUNTER — Inpatient Hospital Stay: Payer: Private Health Insurance - Indemnity

## 2018-09-17 ENCOUNTER — Other Ambulatory Visit: Payer: Self-pay

## 2018-09-17 LAB — HEMOGLOBIN AND HEMATOCRIT, BLOOD
HCT: 39 % (ref 36.0–46.0)
Hemoglobin: 13.3 g/dL (ref 12.0–15.0)

## 2018-09-19 ENCOUNTER — Encounter: Payer: Self-pay | Admitting: Oncology

## 2018-09-19 ENCOUNTER — Other Ambulatory Visit: Payer: Self-pay

## 2018-09-24 ENCOUNTER — Inpatient Hospital Stay: Payer: Private Health Insurance - Indemnity

## 2018-09-24 ENCOUNTER — Other Ambulatory Visit: Payer: Self-pay

## 2018-09-24 LAB — CBC WITH DIFFERENTIAL/PLATELET
Abs Immature Granulocytes: 0.06 10*3/uL (ref 0.00–0.07)
Basophils Absolute: 0.1 10*3/uL (ref 0.0–0.1)
Basophils Relative: 1 %
Eosinophils Absolute: 0.3 10*3/uL (ref 0.0–0.5)
Eosinophils Relative: 4 %
HCT: 36.9 % (ref 36.0–46.0)
Hemoglobin: 12.5 g/dL (ref 12.0–15.0)
Immature Granulocytes: 1 %
Lymphocytes Relative: 24 %
Lymphs Abs: 2 10*3/uL (ref 0.7–4.0)
MCH: 32.8 pg (ref 26.0–34.0)
MCHC: 33.9 g/dL (ref 30.0–36.0)
MCV: 96.9 fL (ref 80.0–100.0)
Monocytes Absolute: 0.5 10*3/uL (ref 0.1–1.0)
Monocytes Relative: 6 %
Neutro Abs: 5.4 10*3/uL (ref 1.7–7.7)
Neutrophils Relative %: 64 %
Platelets: 288 10*3/uL (ref 150–400)
RBC: 3.81 MIL/uL — ABNORMAL LOW (ref 3.87–5.11)
RDW: 12.7 % (ref 11.5–15.5)
WBC: 8.4 10*3/uL (ref 4.0–10.5)
nRBC: 0 % (ref 0.0–0.2)

## 2018-09-24 LAB — COMPREHENSIVE METABOLIC PANEL
ALT: 17 U/L (ref 0–44)
AST: 20 U/L (ref 15–41)
Albumin: 3.9 g/dL (ref 3.5–5.0)
Alkaline Phosphatase: 58 U/L (ref 38–126)
Anion gap: 8 (ref 5–15)
BUN: 19 mg/dL (ref 8–23)
CO2: 28 mmol/L (ref 22–32)
Calcium: 9.1 mg/dL (ref 8.9–10.3)
Chloride: 105 mmol/L (ref 98–111)
Creatinine, Ser: 1.03 mg/dL — ABNORMAL HIGH (ref 0.44–1.00)
GFR calc Af Amer: >60 mL/min (ref >60)
GFR calc non Af Amer: 58 mL/min — ABNORMAL LOW (ref >60)
Glucose, Bld: 126 mg/dL — ABNORMAL HIGH (ref 70–99)
Potassium: 4.1 mmol/L (ref 3.5–5.1)
Sodium: 141 mmol/L (ref 135–145)
Total Bilirubin: 0.6 mg/dL (ref 0.3–1.2)
Total Protein: 7 g/dL (ref 6.5–8.1)

## 2018-09-24 LAB — FERRITIN: Ferritin: 39 ng/mL (ref 11–307)

## 2018-09-24 LAB — IRON AND TIBC
Iron: 91 ug/dL (ref 28–170)
Saturation Ratios: 32 % — ABNORMAL HIGH (ref 10.4–31.8)
TIBC: 281 ug/dL (ref 250–450)
UIBC: 190 ug/dL

## 2018-09-25 ENCOUNTER — Encounter: Payer: Self-pay | Admitting: Oncology

## 2018-09-25 ENCOUNTER — Other Ambulatory Visit: Payer: Self-pay

## 2018-09-25 ENCOUNTER — Inpatient Hospital Stay: Payer: Private Health Insurance - Indemnity

## 2018-09-25 ENCOUNTER — Inpatient Hospital Stay (HOSPITAL_BASED_OUTPATIENT_CLINIC_OR_DEPARTMENT_OTHER): Payer: Private Health Insurance - Indemnity | Admitting: Oncology

## 2018-09-25 NOTE — Progress Notes (Signed)
Called patient for Telehealth visit via [place].  Patient states that she felt fatigued after last phlebotomy.

## 2018-09-25 NOTE — Progress Notes (Signed)
HEMATOLOGY-ONCOLOGY TeleHEALTH VISIT PROGRESS NOTE  I connected with Rhonda Brock on 09/25/18 at  8:45 AM EDT by video enabled telemedicine visit and verified that I am speaking with the correct person using two identifiers. I discussed the limitations, risks, security and privacy concerns of performing an evaluation and management service by telemedicine and the availability of in-person appointments. I also discussed with the patient that there may be a patient responsible charge related to this service. The patient expressed understanding and agreed to proceed.   Other persons participating in the visit and their role in the encounter:  Geraldine Solar, [place], check in patient      Patient's location: Home  Provider's location: home office Chief Complaint: Follow-up for management of iron overload, homozygous hemochromatosis and evaluation for need of phlebotomy.  I attempted to connect the patient for visual enabled telehealth visit via [employer].  Due to the technical difficulties with video,  Patient was transitioned to audio only visit.   INTERVAL HISTORY Rhonda Brock is a 64 y.o. female who has above history reviewed by me today presents for follow up visit for management of  management of iron overload, homozygous hemochromatosis and evaluation for need of phlebotomy. Problems and complaints are listed below:  Patient has been on every 2 weeks phlebotomy program She reports feeling tired and fatigued after 1 of the phlebotomy.  Most recent phlebotomy was done 1 week ago.  Patient has establish care with Dr. Vicente Males who obtained ultrasound right upper quadrant with elastography on 05/04/2018 which showed hyperechoic hepatic parenchyma suggesting hepatocellular disease likely reflecting patient's known hemochromatosis. Other than the fatigue and tired.  She has no new complaints.  Review of Systems  Constitutional: Positive for fatigue. Negative for appetite change, chills and fever.   HENT:   Negative for hearing loss and voice change.   Eyes: Negative for eye problems.  Respiratory: Negative for chest tightness and cough.   Cardiovascular: Negative for chest pain.  Gastrointestinal: Negative for abdominal distention, abdominal pain and blood in stool.  Endocrine: Negative for hot flashes.  Genitourinary: Negative for difficulty urinating and frequency.   Musculoskeletal: Negative for arthralgias.  Skin: Negative for itching and rash.  Neurological: Negative for extremity weakness.  Hematological: Negative for adenopathy.  Psychiatric/Behavioral: Negative for confusion.    Past Medical History:  Diagnosis Date  . Anxiety   . Back pain   . Chronic headaches   . Cyst (solitary) of breast    bilaterally  . GERD (gastroesophageal reflux disease)   . Hemochromatosis, hereditary ([place]) 03/22/2018  . Hypertension    Past Surgical History:  Procedure Laterality Date  . ABDOMINAL HYSTERECTOMY  2001  . APPENDECTOMY  1962  . breast cysts    . BREAST SURGERY  2004, 2005   left breast cyst drained x 2  . COLONOSCOPY  06/08/2006  . COLONOSCOPY WITH PROPOFOL N/A 06/29/2016   Procedure: COLONOSCOPY WITH PROPOFOL;  Surgeon: Robert Bellow, MD;  Location: [facility] ENDOSCOPY;  Service: Endoscopy;  Laterality: N/A;  . TONSILLECTOMY  1970    Family History  Problem Relation Age of Onset  . Cancer Mother 16       Breast Cancer-Mastectomy, Also Abdominal cancer  . Asthma Father   . Diabetes Father     Social History   Socioeconomic History  . Marital status: Married    Spouse name: Not on file  . Number of children: Not on file  . Years of education: 33  . Highest education level: Not on  file  Occupational History  . Occupation: Agricultural engineer  Social Needs  . Financial resource strain: Not on file  . Food insecurity    Worry: Not on file    Inability: Not on file  . Transportation needs    Medical: Not on file    Non-medical: Not on file  Tobacco Use  . Smoking  status: Never Smoker  . Smokeless tobacco: Never Used  Substance and Sexual Activity  . Alcohol use: No  . Drug use: No  . Sexual activity: Not on file  Lifestyle  . Physical activity    Days per week: Not on file    Minutes per session: Not on file  . Stress: Not on file  Relationships  . Social Herbalist on phone: Not on file    Gets together: Not on file    Attends religious service: Not on file    Active member of club or organization: Not on file    Attends meetings of clubs or organizations: Not on file    Relationship status: Not on file  . Intimate partner violence    Fear of current or ex partner: Not on file    Emotionally abused: Not on file    Physically abused: Not on file    Forced sexual activity: Not on file  Other Topics Concern  . Not on file  Social History Narrative   Regular exercise-yes   Caffeine use-yes          Current Outpatient Medications on File Prior to Visit  Medication Sig Dispense Refill  . calcium carbonate (TUMS - DOSED IN MG ELEMENTAL CALCIUM) 500 MG chewable tablet Chew 1 tablet by mouth daily.    . hydrochlorothiazide (HYDRODIURIL) 25 MG tablet Take 1 tablet (25 mg total) by mouth daily. 90 tablet 1   No current facility-administered medications on file prior to visit.     No Known Allergies     Observations/Objective: There were no vitals filed for this visit. There is no height or weight on file to calculate BMI.  Pain level 0 Physical Exam  Constitutional: She is oriented to person, place, and time.  Neurological: She is alert and oriented to person, place, and time.  Psychiatric: Affect normal.    CBC    Component Value Date/Time   WBC 8.4 09/24/2018 0953   RBC 3.81 (L) 09/24/2018 0953   HGB 12.5 09/24/2018 0953   HGB 13.4 12/16/2011 1459   HCT 36.9 09/24/2018 0953   HCT 40.1 12/16/2011 1459   PLT 288 09/24/2018 0953   PLT 261 12/16/2011 1459   MCV 96.9 09/24/2018 0953   MCV 96 12/16/2011 1459   MCH  32.8 09/24/2018 0953   MCHC 33.9 09/24/2018 0953   RDW 12.7 09/24/2018 0953   RDW 13.1 12/16/2011 1459   LYMPHSABS 2.0 09/24/2018 0953   LYMPHSABS 2.5 12/16/2011 1459   MONOABS 0.5 09/24/2018 0953   MONOABS 0.7 12/16/2011 1459   EOSABS 0.3 09/24/2018 0953   EOSABS 0.4 12/16/2011 1459   BASOSABS 0.1 09/24/2018 0953   BASOSABS 0.1 12/16/2011 1459    CMP     Component Value Date/Time   NA 141 09/24/2018 0953   K 4.1 09/24/2018 0953   CL 105 09/24/2018 0953   CO2 28 09/24/2018 0953   GLUCOSE 126 (H) 09/24/2018 0953   BUN 19 09/24/2018 0953   CREATININE 1.03 (H) 09/24/2018 0953   CALCIUM 9.1 09/24/2018 0953   PROT 7.0 09/24/2018 0953  ALBUMIN 3.9 09/24/2018 0953   AST 20 09/24/2018 0953   ALT 17 09/24/2018 0953   ALKPHOS 58 09/24/2018 0953   BILITOT 0.6 09/24/2018 0953   GFRNONAA 58 (L) 09/24/2018 0953   GFRAA >60 09/24/2018 0953     Assessment and Plan: 1. Hemochromatosis, hereditary ([place])   2. Iron overload   Labs are reviewed and discussed with patient. Iron panel shows improvement and decrease of ferritin to be 39, at goal.  Hold phlebotomy today.  Repeat H&H and ferritin in 2 weeks, 6 weeks, 10 weeks. Phlebotomize if ferritin above 50 and hemoglobin above 12. Discussed with patient.  And she agrees with the plan. Follow Up Instructions: Follow-up in the clinic in 14 weeks with labs and assessment of additional phlebotomy schedule.  I discussed the assessment and treatment plan with the patient. The patient was provided an opportunity to ask questions and all were answered. The patient agreed with the plan and demonstrated an understanding of the instructions.  The patient was advised to call back or seek an in-person evaluation if the symptoms worsen or if the condition fails to improve as anticipated.  Earlie Server, MD 09/25/2018 8:56 AM

## 2018-09-30 ENCOUNTER — Encounter: Payer: Self-pay | Admitting: Oncology

## 2018-10-01 ENCOUNTER — Other Ambulatory Visit: Payer: Private Health Insurance - Indemnity

## 2018-10-08 ENCOUNTER — Other Ambulatory Visit: Payer: Self-pay

## 2018-10-09 ENCOUNTER — Inpatient Hospital Stay: Payer: Private Health Insurance - Indemnity | Attending: Oncology

## 2018-10-09 ENCOUNTER — Other Ambulatory Visit: Payer: Self-pay

## 2018-10-09 LAB — HEMOGLOBIN AND HEMATOCRIT, BLOOD
HCT: 40.3 % (ref 36.0–46.0)
Hemoglobin: 13.7 g/dL (ref 12.0–15.0)

## 2018-10-09 LAB — FERRITIN: Ferritin: 42 ng/mL (ref 11–307)

## 2018-10-10 ENCOUNTER — Inpatient Hospital Stay: Payer: Private Health Insurance - Indemnity

## 2018-10-10 ENCOUNTER — Other Ambulatory Visit: Payer: Self-pay

## 2018-10-10 NOTE — Progress Notes (Signed)
Proceed with phlebotomy today per Dr. Tasia Catchings

## 2018-10-30 ENCOUNTER — Other Ambulatory Visit: Payer: Self-pay

## 2018-10-30 ENCOUNTER — Ambulatory Visit
Admission: RE | Admit: 2018-10-30 | Discharge: 2018-10-30 | Disposition: A | Discharge status: Home / Self Care | Payer: Private Health Insurance - Indemnity | Source: Ambulatory Visit | Attending: Oncology | Admitting: Oncology

## 2018-11-06 ENCOUNTER — Other Ambulatory Visit: Payer: Self-pay

## 2018-11-06 ENCOUNTER — Inpatient Hospital Stay: Payer: Private Health Insurance - Indemnity | Attending: Oncology

## 2018-11-06 LAB — FERRITIN: Ferritin: 31 ng/mL (ref 11–307)

## 2018-11-06 LAB — HEMOGLOBIN AND HEMATOCRIT, BLOOD
HCT: 40.8 % (ref 36.0–46.0)
Hemoglobin: 13.9 g/dL (ref 12.0–15.0)

## 2018-11-07 ENCOUNTER — Telehealth: Payer: Self-pay | Admitting: *Deleted

## 2018-11-07 ENCOUNTER — Inpatient Hospital Stay: Payer: Private Health Insurance - Indemnity

## 2018-11-07 NOTE — Telephone Encounter (Signed)
MD, Dr. Tasia Catchings, reviewed lab work from 11/06/2018. Per MD order: patient does not need phlebotomy today. Spoke with patient via telephone to inform her that she does not need phlebotomy today. Patient verbalized understanding and confirmed her next appointment in September.

## 2018-11-16 ENCOUNTER — Ambulatory Visit (INDEPENDENT_AMBULATORY_CARE_PROVIDER_SITE_OTHER): Payer: Private Health Insurance - Indemnity | Admitting: Family Medicine

## 2018-11-16 ENCOUNTER — Encounter: Payer: Self-pay | Admitting: Family Medicine

## 2018-11-16 ENCOUNTER — Ambulatory Visit (INDEPENDENT_AMBULATORY_CARE_PROVIDER_SITE_OTHER): Payer: Private Health Insurance - Indemnity

## 2018-11-16 ENCOUNTER — Other Ambulatory Visit: Payer: Self-pay | Admitting: Internal Medicine

## 2018-11-16 ENCOUNTER — Other Ambulatory Visit: Payer: Self-pay

## 2018-11-16 VITALS — BP 136/88 | HR 81 | Temp 97.9°F | Resp 18 | Ht 65.0 in | Wt 207.4 lb

## 2018-11-16 DIAGNOSIS — Z23 Encounter for immunization: Secondary | ICD-10-CM | POA: Diagnosis not present

## 2018-11-16 DIAGNOSIS — M67432 Ganglion, left wrist: Secondary | ICD-10-CM

## 2018-11-16 NOTE — Progress Notes (Signed)
Subjective:    Patient ID: Rhonda Brock, female    DOB: Sep 25, 1955, 63 y.o.   MRN: [id number]  HPI   Patient presents to clinic due to lump on back of left wrist that has been present for past 2 weeks.  Patient denies any injury to the wrist.  Denies any issues with range of motion of the wrist, pain in wrist or grip strength.  Area is not red or hot to touch.  It is not open or draining.  Patient also needs part 3 of her hepatitis B vaccination series today.  Patient Active Problem List   Diagnosis Date Noted  . Hemochromatosis, hereditary ([place]) 03/22/2018  . Caregiver with fatigue 06/14/2016  . Hypertension, essential 07/01/2014  . Iron overload 12/09/2011  . Obesity (BMI 30-39.9) 10/17/2011  . Hypertriglyceridemia 10/17/2011   Social History   Tobacco Use  . Smoking status: Never Smoker  . Smokeless tobacco: Never Used  Substance Use Topics  . Alcohol use: No   Review of Systems  Constitutional: Negative for chills, fatigue and fever.  HENT: Negative for congestion, ear pain, sinus pain and sore throat.   Eyes: Negative.   Respiratory: Negative for cough, shortness of breath and wheezing.   Cardiovascular: Negative for chest pain, palpitations and leg swelling.  Gastrointestinal: Negative for abdominal pain, diarrhea, nausea and vomiting.  Genitourinary: Negative for dysuria, frequency and urgency.  Musculoskeletal: +lump on back of left wrist Skin: Negative for color change, pallor and rash.  Neurological: Negative for syncope, light-headedness and headaches.  Psychiatric/Behavioral: The patient is not nervous/anxious.       Objective:   Physical Exam Vitals signs and nursing note reviewed.  Constitutional:      General: She is not in acute distress.    Appearance: She is not ill-appearing, toxic-appearing or diaphoretic.  HENT:     Head: Normocephalic and atraumatic.  Cardiovascular:     Rate and Rhythm: Normal rate and regular rhythm.  Pulmonary:   Effort: Pulmonary effort is normal. No respiratory distress.  Musculoskeletal:       Hands:     Comments: Ganglion cyst location represented by red mark on diagram.  It is approximately the size of a involved.  It feels hard to touch..  Not open or draining areas of skin patient side without problems.  Grip strength normal.  Able to feel me touching all fingers of hand, wrist and forearm.  Range of motion of elbow and shoulder are intact.  Skin:    General: Skin is warm and dry.     Coloration: Skin is not jaundiced or pale.  Neurological:     General: No focal deficit present.     Mental Status: She is alert and oriented to person, place, and time.     Gait: Gait normal.  Psychiatric:        Mood and Affect: Mood normal.        Behavior: Behavior normal.    Today's Vitals   11/16/18 1412  BP: 136/88  Pulse: 81  Resp: 18  Temp: 97.9 F (36.6 C)  TempSrc: Temporal  SpO2: 96%  Weight: 207 lb 6.4 oz (94.1 kg)  Height: 5\' 5"  (1.651 m)   Body mass index is 34.51 kg/m.     Assessment & Plan:    Ganglion cyst left wrist - area is highly suspect for ganglion cyst.  We will get x-ray of area to further investigate.  Patient has no issues with grip, pain or range of  motion at this time.  Discussed that we potentially could refer him to orthopedic surgery or hand surgery for evaluation and consideration of removal.  Also advised if she develops pain, issues with range of motion, feelings of numbness, redness or any signs of infection to call office right away.  Third hepatitis B vaccine given in clinic today.  Patient will be regularly scheduled follow-up with PCP as scheduled and RTC if any issues arise.

## 2018-11-16 NOTE — Patient Instructions (Signed)
Ganglion Cyst  A ganglion cyst is a non-cancerous, fluid-filled lump that occurs near a joint or tendon. The cyst grows out of a joint or the lining of a tendon. Ganglion cysts most often develop in the hand or wrist, but they can also develop in the shoulder, elbow, hip, knee, ankle, or foot. Ganglion cysts are ball-shaped or egg-shaped. Their size can range from the size of a pea to larger than a grape. Increased activity may cause the cyst to get bigger because more fluid starts to build up. What are the causes? The exact cause of this condition is not known, but it may be related to:  Inflammation or irritation around the joint.  An injury.  Repetitive movements or overuse.  Arthritis. What increases the risk? You are more likely to develop this condition if:  You are a woman.  You are 40-59 years old. What are the signs or symptoms? The main symptom of this condition is a lump. It most often appears on the hand or wrist. In many cases, there are no other symptoms, but a cyst can sometimes cause:  Tingling.  Pain.  Numbness.  Muscle weakness.  Weak grip.  Less range of motion in a joint. How is this diagnosed? Ganglion cysts are usually diagnosed based on a physical exam. Your health care provider will feel the lump and may shine a light next to it. If it is a ganglion cyst, the light will likely shine through it. Your health care provider may order an X-ray, ultrasound, or MRI to rule out other conditions. How is this treated? Ganglion cysts often go away on their own without treatment. If you have pain or other symptoms, treatment may be needed. Treatment is also needed if the ganglion cyst limits your movement or if it gets infected. Treatment may include:  Wearing a brace or splint on your wrist or finger.  Taking anti-inflammatory medicine.  Having fluid drained from the lump with a needle (aspiration).  Getting a steroid injected into the joint.  Having  surgery to remove the ganglion cyst.  Placing a pad on your shoe or wearing shoes that will not rub against the cyst if it is on your foot. Follow these instructions at home:  Do not press on the ganglion cyst, poke it with a needle, or hit it.  Take over-the-counter and prescription medicines only as told by your health care provider.  If you have a brace or splint: ? Wear it as told by your health care provider. ? Remove it as told by your health care provider. Ask if you need to remove it when you take a shower or a bath.  Watch your ganglion cyst for any changes.  Keep all follow-up visits as told by your health care provider. This is important. Contact a health care provider if:  Your ganglion cyst becomes larger or more painful.  You have pus coming from the lump.  You have weakness or numbness in the affected area.  You have a fever or chills. Get help right away if:  You have a fever and have any of these in the cyst area: ? Increased redness. ? Red streaks. ? Swelling. Summary  A ganglion cyst is a non-cancerous, fluid-filled lump that occurs near a joint or tendon.  Ganglion cysts most often develop in the hand or wrist, but they can also develop in the shoulder, elbow, hip, knee, ankle, or foot.  Ganglion cysts often go away on their own without treatment.  This information is not intended to replace advice given to you by your health care provider. Make sure you discuss any questions you have with your health care provider. Document Released: 03/11/2000 Document Revised: 02/24/2017 Document Reviewed: 11/11/2016 Elsevier Patient Education  2020 Elsevier Inc.  

## 2018-11-19 ENCOUNTER — Ambulatory Visit: Payer: Private Health Insurance - Indemnity | Admitting: Gastroenterology

## 2018-11-19 ENCOUNTER — Ambulatory Visit: Payer: Private Health Insurance - Indemnity | Admitting: Internal Medicine

## 2018-11-19 ENCOUNTER — Encounter: Payer: Self-pay | Admitting: Family Medicine

## 2018-11-20 ENCOUNTER — Other Ambulatory Visit: Payer: Self-pay

## 2018-11-20 ENCOUNTER — Ambulatory Visit (INDEPENDENT_AMBULATORY_CARE_PROVIDER_SITE_OTHER): Payer: Private Health Insurance - Indemnity | Admitting: Gastroenterology

## 2018-11-20 NOTE — Progress Notes (Signed)
Rhonda Bellows MD, MRCP(U.K) [address]  [place]  [place], [place] [address]  Main: [phone]  Fax: [phone]   Primary Care Physician: Crecencio Mc, MD  Primary Gastroenterologist:  Dr. Jonathon Brock   Hemochromotosis  HPI: Rhonda Brock is a 63 y.o. 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 02/2018 : RUQ USG shows diffusely echogenic liver.     No joint pains, no increased pigmentation of skin, does not eat red meat. Not much vitamin C in diet , no OTC iron supplements, Takes TUMS at night .    Interval history   05/01/2018-11/20/2018  05/01/2018: Hep C ab, hep b c ab, Hep b e ab/ag, Hbsab/ag ,  -negative, Hep A ab positive. TSH-normal   05/04/2018: Liver elastography : F0/F1, risk of fibrosis minimal. DEXA scan -normal   11/06/2018: Ferritin 31, Hb 13.9   09/24/2018: LFT's normal  Doing well denies any complaints.  No fatigue.  No joint pains.  She completed all 3 doses of her hepatitis B shots.  Current Outpatient Medications  Medication Sig Dispense Refill  . calcium carbonate (TUMS - DOSED IN MG ELEMENTAL CALCIUM) 500 MG chewable tablet Chew 1 tablet by mouth daily.    . hydrochlorothiazide (HYDRODIURIL) 25 MG tablet TAKE 1 TABLET BY MOUTH EVERY DAY 90 tablet 1   No current facility-administered medications for this visit.     Allergies as of 11/20/2018  . (No Known Allergies)    ROS:  General: Negative for anorexia, weight loss, fever, chills, fatigue, weakness. ENT: Negative for hoarseness, difficulty swallowing , nasal congestion. CV: Negative for chest pain, angina, palpitations, dyspnea on exertion, peripheral edema.  Respiratory: Negative for dyspnea at rest, dyspnea on exertion, cough, sputum, wheezing.  GI: See history of present illness. GU:  Negative for dysuria, hematuria, urinary incontinence, urinary frequency, nocturnal urination.   Endo: Negative for unusual weight change.    Physical Examination:   There were no vitals taken for this visit.  General: Well-nourished, well-developed in no acute distress.  Eyes: No icterus. Conjunctivae pink. Mouth: Oropharyngeal mucosa moist and pink , no lesions erythema or exudate. Lungs: Clear to auscultation bilaterally. Non-labored. Heart: Regular rate and rhythm, no murmurs rubs or gallops.  Abdomen: Bowel sounds are normal, nontender, nondistended, no hepatosplenomegaly or masses, no abdominal bruits or hernia , no rebound or guarding.   Extremities: No lower extremity edema. No clubbing or deformities. Neuro: Alert and oriented x 3.  Grossly intact. Skin: Warm and dry, no jaundice.   Psych: Alert and cooperative, normal mood and affect.   Imaging Studies: Dg Wrist Complete Left  Result Date: 11/16/2018 CLINICAL DATA:  Lump on the back of the wrist. Suspect ganglia on cyst. EXAM: LEFT WRIST - COMPLETE 3+ VIEW COMPARISON:  None. FINDINGS: There is a soft tissue density posterior to the rest measuring approximately 1.7 cm. There is no displaced fracture. No dislocation. There are no significant degenerative changes. IMPRESSION: 1. No acute osseous abnormality. 2. There is a 1.7 cm soft tissue density posterior to the wrists which likely corresponds to the patient's palpable area of concern. This may be further evaluated with MRI or ultrasound as clinically indicated. Electronically Signed   By: Constance Holster M.D.   On: 11/16/2018 19:49   US Abdomen Limited Ruq  Result Date: 10/30/2018 CLINICAL DATA:  63 year old female with hemochromatosis. EXAM: ULTRASOUND ABDOMEN LIMITED RIGHT UPPER QUADRANT  COMPARISON:  Ultrasounds 05/04/2018 and 03/16/2018. FINDINGS: Gallbladder: Chronic cholelithiasis with numerous stones. Gallstones individually estimated at 17 millimeters (image 3). Gallbladder wall thickness remains normal. No sonographic Murphy sign elicited. No pericholecystic fluid.  Common bile duct: Diameter: 5 millimeters, normal. Liver: Chronically echogenic liver (image 46). No discrete liver lesion. No intrahepatic biliary ductal dilatation. Portal vein is patent on color Doppler imaging with normal direction of blood flow towards the liver. Other: Negative visible right kidney. IMPRESSION: Stable ultrasound appearance of the right upper quadrant since 2019. Echogenic liver and chronic cholelithiasis. Electronically Signed   By: Genevie Ann M.D.   On: 10/30/2018 08:26    Assessment and Plan:   Rhonda Brock is a 63 y.o. y/o female here to follow-up for homozygous C2H2Y hemochromatosis. Liver elastrography shows F0/F1 fibrosis and its minimal . Doing well on phlebotomy   Plan  1.Since Ferritin <1000 and has normal LFT's continue Phelobotomy with Dr Tasia Catchings and no reason for a liver biopsy per AASLD guidelines 2011  2. She has no brother or sisters or kids for screening  4. Avoid/limit alcohol intake ,  5.Avoid multivitamins and Vitamin C supplements,Avoid uncooked sea food.  6. Check LFT's every 6 months     Dr Rhonda Bellows  MD,MRCP [facility]) Follow up in 1 year

## 2018-11-27 ENCOUNTER — Ambulatory Visit: Payer: Private Health Insurance - Indemnity | Admitting: Internal Medicine

## 2018-12-04 ENCOUNTER — Other Ambulatory Visit: Payer: Self-pay

## 2018-12-04 ENCOUNTER — Inpatient Hospital Stay: Payer: Private Health Insurance - Indemnity | Attending: Oncology

## 2018-12-04 LAB — HEMOGLOBIN AND HEMATOCRIT, BLOOD
HCT: 42.9 % (ref 36.0–46.0)
Hemoglobin: 14.4 g/dL (ref 12.0–15.0)

## 2018-12-04 LAB — FERRITIN: Ferritin: 39 ng/mL (ref 11–307)

## 2018-12-05 ENCOUNTER — Telehealth: Payer: Self-pay

## 2018-12-05 ENCOUNTER — Inpatient Hospital Stay: Payer: Private Health Insurance - Indemnity

## 2018-12-05 NOTE — Telephone Encounter (Signed)
Patient informed no phlebotomy needed today based on lab results from yesterday.

## 2018-12-28 ENCOUNTER — Ambulatory Visit: Payer: Private Health Insurance - Indemnity

## 2018-12-31 ENCOUNTER — Other Ambulatory Visit: Payer: Self-pay

## 2018-12-31 ENCOUNTER — Ambulatory Visit (INDEPENDENT_AMBULATORY_CARE_PROVIDER_SITE_OTHER): Payer: Private Health Insurance - Indemnity

## 2018-12-31 DIAGNOSIS — Z23 Encounter for immunization: Secondary | ICD-10-CM | POA: Diagnosis not present

## 2018-12-31 NOTE — Progress Notes (Signed)
Patient is coming in for follow up she is doing well no major complaints

## 2019-01-01 ENCOUNTER — Encounter: Payer: Self-pay | Admitting: Oncology

## 2019-01-01 ENCOUNTER — Other Ambulatory Visit: Payer: Self-pay

## 2019-01-01 ENCOUNTER — Inpatient Hospital Stay: Payer: Private Health Insurance - Indemnity | Attending: Oncology

## 2019-01-01 ENCOUNTER — Inpatient Hospital Stay (HOSPITAL_BASED_OUTPATIENT_CLINIC_OR_DEPARTMENT_OTHER): Payer: Private Health Insurance - Indemnity | Admitting: Oncology

## 2019-01-01 ENCOUNTER — Telehealth: Payer: Self-pay

## 2019-01-01 DIAGNOSIS — I10 Essential (primary) hypertension: Secondary | ICD-10-CM | POA: Diagnosis not present

## 2019-01-01 DIAGNOSIS — Z79899 Other long term (current) drug therapy: Secondary | ICD-10-CM | POA: Insufficient documentation

## 2019-01-01 DIAGNOSIS — K219 Gastro-esophageal reflux disease without esophagitis: Secondary | ICD-10-CM | POA: Insufficient documentation

## 2019-01-01 LAB — HEMOGLOBIN AND HEMATOCRIT, BLOOD
HCT: 42.5 % (ref 36.0–46.0)
Hemoglobin: 14.4 g/dL (ref 12.0–15.0)

## 2019-01-01 LAB — FERRITIN: Ferritin: 43 ng/mL (ref 11–307)

## 2019-01-01 NOTE — Telephone Encounter (Signed)
-----   Message from Earlie Server, MD sent at 01/01/2019 11:02 AM EDT ----- No need for phlebotomy tmr. Please let her know.   Follow up plan. Lab cbc iron tibc ferritin in 3 months.  MD and +/- phlebotomy 1-2 days later.

## 2019-01-01 NOTE — Telephone Encounter (Signed)
Patient informed via [place].  Will send a scheduling pool for future appts.

## 2019-01-01 NOTE — Progress Notes (Signed)
Hematology/Oncology follow up note [facility] Telephone:([phone] Fax:(336) [phone]   Patient Care Team: Crecencio Mc, MD as PCP - General (Internal Medicine) Crecencio Mc, MD (Internal Medicine) Bary Castilla Forest Gleason, MD (General Surgery)  REFERRING PROVIDER: Crecencio Mc, MD  CHIEF COMPLAINTS/REASON FOR VISIT:  Patient presented to follow-up for management of hemochromatosis and iron overload.  HISTORY OF PRESENTING ILLNESS:  Rhonda Brock is a  63 y.o.  female with PMH listed below who was referred to me for evaluation of ferritin level Patient has labs done recently which showed elevated ferritin at 623, iron saturation at 66.  Recent liver function tests showed   Context History of previous blood transfusion: denies Shortness of breath: denies Join pain: denies  Fatigue: denies  Reviewed patient's previous labs, patient has labs done on 12/08/2011 which showed elevated iron saturation at 63, ferritin level of 332.8.  Labs on 06/30/2014 iron saturation 60, ferritin was not tested.   #  confirmed hereditary hemochromatosis, homozygous C282Y mutation. #   She has followed up with gastroenterologist and has received hepatitis vaccination.  INTERVAL HISTORY Rhonda Brock is a 63 y.o. female who has above history reviewed by me today presents for follow up visit for management of hereditary hemochromatosis with iron overload. Problems and complaints are listed below: Patient has had phlebotomy done in the past. During interval, she reports feeling well at baseline.  Denies any fatigue or dizziness. No new complaints. . Review of Systems  Constitutional: Negative for appetite change, chills, fatigue and fever.  HENT:   Negative for hearing loss and voice change.   Eyes: Negative for eye problems.  Respiratory: Negative for chest tightness and cough.   Cardiovascular: Negative for chest pain.  Gastrointestinal: Negative for abdominal  distention, abdominal pain and blood in stool.  Endocrine: Negative for hot flashes.  Genitourinary: Negative for difficulty urinating and frequency.   Musculoskeletal: Negative for arthralgias.  Skin: Negative for itching and rash.  Neurological: Negative for extremity weakness.  Hematological: Negative for adenopathy.  Psychiatric/Behavioral: Negative for confusion.    MEDICAL HISTORY:  Past Medical History:  Diagnosis Date  . Anxiety   . Back pain   . Chronic headaches   . Cyst (solitary) of breast    bilaterally  . GERD (gastroesophageal reflux disease)   . Hemochromatosis, hereditary ([place]) 03/22/2018  . Hypertension     SURGICAL HISTORY: Past Surgical History:  Procedure Laterality Date  . ABDOMINAL HYSTERECTOMY  2001  . APPENDECTOMY  1962  . breast cysts    . BREAST SURGERY  2004, 2005   left breast cyst drained x 2  . COLONOSCOPY  06/08/2006  . COLONOSCOPY WITH PROPOFOL N/A 06/29/2016   Procedure: COLONOSCOPY WITH PROPOFOL;  Surgeon: Robert Bellow, MD;  Location: [facility] ENDOSCOPY;  Service: Endoscopy;  Laterality: N/A;  . TONSILLECTOMY  1970    SOCIAL HISTORY: Social History   Socioeconomic History  . Marital status: Married    Spouse name: Not on file  . Number of children: Not on file  . Years of education: 31  . Highest education level: Not on file  Occupational History  . Occupation: Agricultural engineer  Social Needs  . Financial resource strain: Not on file  . Food insecurity    Worry: Not on file    Inability: Not on file  . Transportation needs    Medical: Not on file    Non-medical: Not on file  Tobacco Use  . Smoking status: Never Smoker  .  Smokeless tobacco: Never Used  Substance and Sexual Activity  . Alcohol use: No  . Drug use: No  . Sexual activity: Not on file  Lifestyle  . Physical activity    Days per week: Not on file    Minutes per session: Not on file  . Stress: Not on file  Relationships  . Social Herbalist on phone:  Not on file    Gets together: Not on file    Attends religious service: Not on file    Active member of club or organization: Not on file    Attends meetings of clubs or organizations: Not on file    Relationship status: Not on file  . Intimate partner violence    Fear of current or ex partner: Not on file    Emotionally abused: Not on file    Physically abused: Not on file    Forced sexual activity: Not on file  Other Topics Concern  . Not on file  Social History Narrative   Regular exercise-yes   Caffeine use-yes          FAMILY HISTORY: Family History  Problem Relation Age of Onset  . Cancer Mother 84       Breast Cancer-Mastectomy, Also Abdominal cancer  . Asthma Father   . Diabetes Father     ALLERGIES:  has No Known Allergies.  MEDICATIONS:  Current Outpatient Medications  Medication Sig Dispense Refill  . calcium carbonate (TUMS - DOSED IN MG ELEMENTAL CALCIUM) 500 MG chewable tablet Chew 1 tablet by mouth daily.    . hydrochlorothiazide (HYDRODIURIL) 25 MG tablet TAKE 1 TABLET BY MOUTH EVERY DAY 90 tablet 1   No current facility-administered medications for this visit.      PHYSICAL EXAMINATION: ECOG PERFORMANCE STATUS: 0 - Asymptomatic Vitals:   01/01/19 1032  BP: (!) 150/93  Pulse: 70  Resp: 18  Temp: 98 F (36.7 C)   Filed Weights   01/01/19 1032  Weight: 209 lb 9.6 oz (95.1 kg)    Physical Exam Constitutional:      General: She is not in acute distress. HENT:     Head: Normocephalic and atraumatic.  Eyes:     General: No scleral icterus.    Pupils: Pupils are equal, round, and reactive to light.  Neck:     Musculoskeletal: Normal range of motion and neck supple.  Cardiovascular:     Rate and Rhythm: Normal rate and regular rhythm.     Heart sounds: Normal heart sounds.  Pulmonary:     Effort: Pulmonary effort is normal. No respiratory distress.     Breath sounds: No wheezing.  Abdominal:     General: Bowel sounds are normal. There  is no distension.     Palpations: Abdomen is soft. There is no mass.     Tenderness: There is no abdominal tenderness.  Musculoskeletal: Normal range of motion.        General: No deformity.  Skin:    General: Skin is warm and dry.     Findings: No erythema or rash.  Neurological:     Mental Status: She is alert and oriented to person, place, and time.     Cranial Nerves: No cranial nerve deficit.     Coordination: Coordination normal.  Psychiatric:        Behavior: Behavior normal.        Thought Content: Thought content normal.      LABORATORY DATA:  I have reviewed  the data as listed Lab Results  Component Value Date   WBC 8.4 09/24/2018   HGB 14.4 01/01/2019   HCT 42.5 01/01/2019   MCV 96.9 09/24/2018   PLT 288 09/24/2018   Recent Labs    02/19/18 0830 07/20/18 1122 08/21/18 1408 09/24/18 0953  NA 139  --  141 141  K 3.9  --  3.2* 4.1  CL 102  --  102 105  CO2 30  --  30 28  GLUCOSE 107*  --  103* 126*  BUN 15  --  20 19  CREATININE 0.91  --  0.86 1.03*  CALCIUM 9.4  --  9.2 9.1  GFRNONAA  --   --  >60 58*  GFRAA  --   --  >60 >60  PROT 6.9 7.2 6.9 7.0  ALBUMIN 4.1 4.1 4.0 3.9  AST 26 19 31 20   ALT 26 18 58* 17  ALKPHOS 57 70 77 58  BILITOT 0.8 0.5 0.6 0.6  BILIDIR  --  0.1  --   --   IBILI  --  0.4  --   --    Iron/TIBC/Ferritin/ %Sat    Component Value Date/Time   IRON 91 09/24/2018 0953   IRON 130 12/16/2011 1459   TIBC 281 09/24/2018 0953   TIBC 226 (L) 12/16/2011 1459   FERRITIN 43 01/01/2019 0949   FERRITIN 367 12/16/2011 1459   IRONPCTSAT 32 (H) 09/24/2018 0953   IRONPCTSAT 66 (H) 02/19/2018 0830     RADIOGRAPHIC STUDIES: I have personally reviewed the radiological images as listed and agreed with the findings in the report.  10/06/2017 Mammogram Negative, recommend annual screening mammogram.   ASSESSMENT & PLAN:  1. Iron overload   2. Hemochromatosis, hereditary ([place])   Homozygous hemochromatosis mutation. Labs are reviewed and  discussed with patient. Ferritin was available after patient's encounter.  Patient was contacted. Hold additional phlebotomy at this time.  Ferritin is less than 50. Looking at her ferritin trend, ferritin level increases slowly.  And I think it is relatively safe for her to return in the clinic in 3 months and repeat blood work and possible phlebotomy at that time if ferritin is above the target. She agrees with the plan.   All questions were answered. The patient knows to call the clinic with any problems questions or concerns.  Return of visit: 3 months.   Earlie Server, MD, PhD Hematology Oncology [facility] at [facility] Pager- [phone] 01/01/2019

## 2019-01-02 ENCOUNTER — Inpatient Hospital Stay: Payer: Private Health Insurance - Indemnity

## 2019-01-04 LAB — HM MAMMOGRAPHY

## 2019-04-02 ENCOUNTER — Other Ambulatory Visit: Payer: Self-pay

## 2019-04-03 ENCOUNTER — Inpatient Hospital Stay: Payer: Private Health Insurance - Indemnity | Attending: Oncology

## 2019-04-03 ENCOUNTER — Other Ambulatory Visit: Payer: Self-pay

## 2019-04-03 DIAGNOSIS — K219 Gastro-esophageal reflux disease without esophagitis: Secondary | ICD-10-CM | POA: Diagnosis not present

## 2019-04-03 DIAGNOSIS — Z79899 Other long term (current) drug therapy: Secondary | ICD-10-CM | POA: Insufficient documentation

## 2019-04-03 DIAGNOSIS — I10 Essential (primary) hypertension: Secondary | ICD-10-CM | POA: Insufficient documentation

## 2019-04-03 LAB — CBC WITH DIFFERENTIAL/PLATELET
Abs Immature Granulocytes: 0.05 10*3/uL (ref 0.00–0.07)
Basophils Absolute: 0.1 10*3/uL (ref 0.0–0.1)
Basophils Relative: 1 %
Eosinophils Absolute: 0.2 10*3/uL (ref 0.0–0.5)
Eosinophils Relative: 2 %
HCT: 42.1 % (ref 36.0–46.0)
Hemoglobin: 14.1 g/dL (ref 12.0–15.0)
Immature Granulocytes: 1 %
Lymphocytes Relative: 25 %
Lymphs Abs: 2.3 10*3/uL (ref 0.7–4.0)
MCH: 32.3 pg (ref 26.0–34.0)
MCHC: 33.5 g/dL (ref 30.0–36.0)
MCV: 96.6 fL (ref 80.0–100.0)
Monocytes Absolute: 0.7 10*3/uL (ref 0.1–1.0)
Monocytes Relative: 7 %
Neutro Abs: 6.1 10*3/uL (ref 1.7–7.7)
Neutrophils Relative %: 64 %
Platelets: 282 10*3/uL (ref 150–400)
RBC: 4.36 MIL/uL (ref 3.87–5.11)
RDW: 12.4 % (ref 11.5–15.5)
WBC: 9.4 10*3/uL (ref 4.0–10.5)
nRBC: 0 % (ref 0.0–0.2)

## 2019-04-03 LAB — HEPATIC FUNCTION PANEL
ALT: 22 U/L (ref 0–44)
AST: 23 U/L (ref 15–41)
Albumin: 4.1 g/dL (ref 3.5–5.0)
Alkaline Phosphatase: 63 U/L (ref 38–126)
Bilirubin, Direct: 0.1 mg/dL (ref 0.0–0.2)
Indirect Bilirubin: 0.8 mg/dL (ref 0.3–0.9)
Total Bilirubin: 0.9 mg/dL (ref 0.3–1.2)
Total Protein: 7 g/dL (ref 6.5–8.1)

## 2019-04-03 LAB — IRON AND TIBC
Iron: 162 ug/dL (ref 28–170)
Saturation Ratios: 56 % — ABNORMAL HIGH (ref 10.4–31.8)
TIBC: 288 ug/dL (ref 250–450)
UIBC: 126 ug/dL

## 2019-04-03 LAB — FERRITIN: Ferritin: 58 ng/mL (ref 11–307)

## 2019-04-04 ENCOUNTER — Other Ambulatory Visit: Payer: Self-pay

## 2019-04-04 ENCOUNTER — Encounter: Payer: Self-pay | Admitting: Oncology

## 2019-04-04 NOTE — Progress Notes (Signed)
Patient pre screened for office appointment, no questions or concerns today. Patient reminded of upcoming appointment time and date. 

## 2019-04-05 ENCOUNTER — Other Ambulatory Visit: Payer: Self-pay

## 2019-04-05 ENCOUNTER — Inpatient Hospital Stay (HOSPITAL_BASED_OUTPATIENT_CLINIC_OR_DEPARTMENT_OTHER): Payer: Private Health Insurance - Indemnity | Admitting: Oncology

## 2019-04-05 ENCOUNTER — Inpatient Hospital Stay: Payer: Private Health Insurance - Indemnity

## 2019-04-06 ENCOUNTER — Encounter: Payer: Self-pay | Admitting: Oncology

## 2019-04-06 NOTE — Progress Notes (Signed)
Hematology/Oncology follow up note [facility] Telephone:(336) [phone] Fax:(336) [phone]   Patient Care Team: Crecencio Mc, MD as PCP - General (Internal Medicine) Crecencio Mc, MD (Internal Medicine) Bary Castilla Forest Gleason, MD (General Surgery)  REFERRING PROVIDER: Crecencio Mc, MD  CHIEF COMPLAINTS/REASON FOR VISIT:  Patient presented to follow-up for management of hemochromatosis and iron overload.  HISTORY OF PRESENTING ILLNESS:  Rhonda Brock is a  64 y.o.  female with PMH listed below who was referred to me for evaluation of ferritin level Patient has labs done recently which showed elevated ferritin at 623, iron saturation at 66.  Recent liver function tests showed   Context History of previous blood transfusion: denies Shortness of breath: denies Join pain: denies  Fatigue: denies  Reviewed patient's previous labs, patient has labs done on 12/08/2011 which showed elevated iron saturation at 63, ferritin level of 332.8.  Labs on 06/30/2014 iron saturation 60, ferritin was not tested.   #  confirmed hereditary hemochromatosis, homozygous C282Y mutation. #   She has followed up with gastroenterologist and has received hepatitis vaccination.  INTERVAL HISTORY Rhonda Brock is a 64 y.o. female who has above history reviewed by me today presents for follow up visit for management of hereditary hemochromatosis with iron overload. Problems and complaints are listed below: Patient reports no new complaints today.  Denies any fatigue or dizziness. Patient has had phlebotomy done in the past.  . Review of Systems  Constitutional: Negative for appetite change, chills, fatigue and fever.  HENT:   Negative for hearing loss and voice change.   Eyes: Negative for eye problems.  Respiratory: Negative for chest tightness and cough.   Cardiovascular: Negative for chest pain.  Gastrointestinal: Negative for abdominal distention, abdominal pain and blood  in stool.  Endocrine: Negative for hot flashes.  Genitourinary: Negative for difficulty urinating and frequency.   Musculoskeletal: Negative for arthralgias.  Skin: Negative for itching and rash.  Neurological: Negative for extremity weakness.  Hematological: Negative for adenopathy.  Psychiatric/Behavioral: Negative for confusion.    MEDICAL HISTORY:  Past Medical History:  Diagnosis Date  . Anxiety   . Back pain   . Chronic headaches   . Cyst (solitary) of breast    bilaterally  . GERD (gastroesophageal reflux disease)   . Hemochromatosis, hereditary ([place]) 03/22/2018  . Hypertension     SURGICAL HISTORY: Past Surgical History:  Procedure Laterality Date  . ABDOMINAL HYSTERECTOMY  2001  . APPENDECTOMY  1962  . breast cysts    . BREAST SURGERY  2004, 2005   left breast cyst drained x 2  . COLONOSCOPY  06/08/2006  . COLONOSCOPY WITH PROPOFOL N/A 06/29/2016   Procedure: COLONOSCOPY WITH PROPOFOL;  Surgeon: Robert Bellow, MD;  Location: [facility] ENDOSCOPY;  Service: Endoscopy;  Laterality: N/A;  . TONSILLECTOMY  1970    SOCIAL HISTORY: Social History   Socioeconomic History  . Marital status: Married    Spouse name: Not on file  . Number of children: Not on file  . Years of education: 30  . Highest education level: Not on file  Occupational History  . Occupation: Homemaker  Tobacco Use  . Smoking status: Never Smoker  . Smokeless tobacco: Never Used  Substance and Sexual Activity  . Alcohol use: No  . Drug use: No  . Sexual activity: Not on file  Other Topics Concern  . Not on file  Social History Narrative   Regular exercise-yes   Caffeine use-yes  Social Determinants of Health   Financial Resource Strain:   . Difficulty of Paying Living Expenses: Not on file  Food Insecurity:   . Worried About Charity fundraiser in the Last Year: Not on file  . Ran Out of Food in the Last Year: Not on file  Transportation Needs:   . Lack of Transportation  (Medical): Not on file  . Lack of Transportation (Non-Medical): Not on file  Physical Activity:   . Days of Exercise per Week: Not on file  . Minutes of Exercise per Session: Not on file  Stress:   . Feeling of Stress : Not on file  Social Connections:   . Frequency of Communication with Friends and Family: Not on file  . Frequency of Social Gatherings with Friends and Family: Not on file  . Attends Religious Services: Not on file  . Active Member of Clubs or Organizations: Not on file  . Attends Archivist Meetings: Not on file  . Marital Status: Not on file  Intimate Partner Violence:   . Fear of Current or Ex-Partner: Not on file  . Emotionally Abused: Not on file  . Physically Abused: Not on file  . Sexually Abused: Not on file    FAMILY HISTORY: Family History  Problem Relation Age of Onset  . Cancer Mother 68       Breast Cancer-Mastectomy, Also Abdominal cancer  . Asthma Father   . Diabetes Father     ALLERGIES:  has No Known Allergies.  MEDICATIONS:  Current Outpatient Medications  Medication Sig Dispense Refill  . calcium carbonate (TUMS - DOSED IN MG ELEMENTAL CALCIUM) 500 MG chewable tablet Chew 1 tablet by mouth daily.    . hydrochlorothiazide (HYDRODIURIL) 25 MG tablet TAKE 1 TABLET BY MOUTH EVERY DAY 90 tablet 1   No current facility-administered medications for this visit.     PHYSICAL EXAMINATION: ECOG PERFORMANCE STATUS: 0 - Asymptomatic Vitals:   04/05/19 1341  BP: (!) 163/90  Pulse: 92  Resp: 18  Temp: (!) 97.1 F (36.2 C)   Filed Weights   04/05/19 1341  Weight: 204 lb 6.4 oz (92.7 kg)    Physical Exam Constitutional:      General: She is not in acute distress. HENT:     Head: Normocephalic and atraumatic.  Eyes:     General: No scleral icterus.    Pupils: Pupils are equal, round, and reactive to light.  Cardiovascular:     Rate and Rhythm: Normal rate and regular rhythm.     Heart sounds: Normal heart sounds.    Pulmonary:     Effort: Pulmonary effort is normal. No respiratory distress.     Breath sounds: No wheezing.  Abdominal:     General: Bowel sounds are normal. There is no distension.     Palpations: Abdomen is soft. There is no mass.     Tenderness: There is no abdominal tenderness.  Musculoskeletal:        General: No deformity. Normal range of motion.     Cervical back: Normal range of motion and neck supple.  Skin:    General: Skin is warm and dry.     Findings: No erythema or rash.  Neurological:     Mental Status: She is alert and oriented to person, place, and time.     Cranial Nerves: No cranial nerve deficit.     Coordination: Coordination normal.  Psychiatric:        Behavior: Behavior normal.  Thought Content: Thought content normal.      LABORATORY DATA:  I have reviewed the data as listed Lab Results  Component Value Date   WBC 9.4 04/03/2019   HGB 14.1 04/03/2019   HCT 42.1 04/03/2019   MCV 96.6 04/03/2019   PLT 282 04/03/2019   Recent Labs    07/20/18 1122 08/21/18 1408 09/24/18 0953 04/03/19 1304  NA  --  141 141  --   K  --  3.2* 4.1  --   CL  --  102 105  --   CO2  --  30 28  --   GLUCOSE  --  103* 126*  --   BUN  --  20 19  --   CREATININE  --  0.86 1.03*  --   CALCIUM  --  9.2 9.1  --   GFRNONAA  --  >60 58*  --   GFRAA  --  >60 >60  --   PROT 7.2 6.9 7.0 7.0  ALBUMIN 4.1 4.0 3.9 4.1  AST 19 31 20 23   ALT 18 58* 17 22  ALKPHOS 70 77 58 63  BILITOT 0.5 0.6 0.6 0.9  BILIDIR 0.1  --   --  0.1  IBILI 0.4  --   --  0.8   Iron/TIBC/Ferritin/ %Sat    Component Value Date/Time   IRON 162 04/03/2019 1304   IRON 130 12/16/2011 1459   TIBC 288 04/03/2019 1304   TIBC 226 (L) 12/16/2011 1459   FERRITIN 58 04/03/2019 1304   FERRITIN 367 12/16/2011 1459   IRONPCTSAT 56 (H) 04/03/2019 1304   IRONPCTSAT 66 (H) 02/19/2018 0830     RADIOGRAPHIC STUDIES: I have personally reviewed the radiological images as listed and agreed with the  findings in the report.  10/06/2017 Mammogram Negative, recommend annual screening mammogram.   ASSESSMENT & PLAN:  1. Hemochromatosis, hereditary ([place])   2. Iron overload   Homozygous hemochromatosis mutation. Labs are reviewed and discussed with patient. Ferritin 58, iron saturation 56. I recommend proceed with phlebotomy 300 cc today.  All questions were answered. The patient knows to call the clinic with any problems questions or concerns.  Return of visit: She will have blood work done and follow-up in 6 months for possible phlebotomy.   Earlie Server, MD, PhD Hematology Oncology [facility] at [facility] Pager- [phone] 04/06/2019

## 2019-04-15 ENCOUNTER — Encounter: Payer: Self-pay | Admitting: Oncology

## 2019-06-01 ENCOUNTER — Other Ambulatory Visit: Payer: Self-pay | Admitting: Internal Medicine

## 2019-06-09 ENCOUNTER — Encounter: Payer: Self-pay | Admitting: Emergency Medicine

## 2019-06-09 ENCOUNTER — Emergency Department: Payer: 59

## 2019-06-09 ENCOUNTER — Emergency Department
Admission: EM | Admit: 2019-06-09 | Discharge: 2019-06-09 | Disposition: A | Discharge status: Home / Self Care | Payer: 59 | Attending: Emergency Medicine | Admitting: Emergency Medicine

## 2019-06-09 ENCOUNTER — Other Ambulatory Visit: Payer: Self-pay

## 2019-06-09 DIAGNOSIS — R1011 Right upper quadrant pain: Secondary | ICD-10-CM | POA: Diagnosis not present

## 2019-06-09 DIAGNOSIS — I10 Essential (primary) hypertension: Secondary | ICD-10-CM | POA: Insufficient documentation

## 2019-06-09 DIAGNOSIS — R110 Nausea: Secondary | ICD-10-CM | POA: Insufficient documentation

## 2019-06-09 DIAGNOSIS — R079 Chest pain, unspecified: Secondary | ICD-10-CM | POA: Diagnosis present

## 2019-06-09 DIAGNOSIS — K819 Cholecystitis, unspecified: Secondary | ICD-10-CM

## 2019-06-09 DIAGNOSIS — Z79899 Other long term (current) drug therapy: Secondary | ICD-10-CM | POA: Diagnosis not present

## 2019-06-09 LAB — CBC
HCT: 43.9 % (ref 36.0–46.0)
Hemoglobin: 15.3 g/dL — ABNORMAL HIGH (ref 12.0–15.0)
MCH: 32.5 pg (ref 26.0–34.0)
MCHC: 34.9 g/dL (ref 30.0–36.0)
MCV: 93.2 fL (ref 80.0–100.0)
Platelets: 310 10*3/uL (ref 150–400)
RBC: 4.71 MIL/uL (ref 3.87–5.11)
RDW: 12.1 % (ref 11.5–15.5)
WBC: 17 10*3/uL — ABNORMAL HIGH (ref 4.0–10.5)
nRBC: 0 % (ref 0.0–0.2)

## 2019-06-09 LAB — BASIC METABOLIC PANEL
Anion gap: 12 (ref 5–15)
BUN: 18 mg/dL (ref 8–23)
CO2: 24 mmol/L (ref 22–32)
Calcium: 9.5 mg/dL (ref 8.9–10.3)
Chloride: 101 mmol/L (ref 98–111)
Creatinine, Ser: 0.88 mg/dL (ref 0.44–1.00)
GFR calc Af Amer: >60 mL/min (ref >60)
GFR calc non Af Amer: >60 mL/min (ref >60)
Glucose, Bld: 139 mg/dL — ABNORMAL HIGH (ref 70–99)
Potassium: 3.3 mmol/L — ABNORMAL LOW (ref 3.5–5.1)
Sodium: 137 mmol/L (ref 135–145)

## 2019-06-09 LAB — HEPATIC FUNCTION PANEL
ALT: 20 U/L (ref 0–44)
AST: 24 U/L (ref 15–41)
Albumin: 4.1 g/dL (ref 3.5–5.0)
Alkaline Phosphatase: 65 U/L (ref 38–126)
Bilirubin, Direct: <0.1 mg/dL (ref 0.0–0.2)
Total Bilirubin: 1 mg/dL (ref 0.3–1.2)
Total Protein: 7.3 g/dL (ref 6.5–8.1)

## 2019-06-09 LAB — TROPONIN I (HIGH SENSITIVITY)
Troponin I (High Sensitivity): 3 ng/L (ref <18)
Troponin I (High Sensitivity): 5 ng/L (ref <18)

## 2019-06-09 LAB — LIPASE, BLOOD: Lipase: 24 U/L (ref 11–51)

## 2019-06-09 MED ORDER — ONDANSETRON 4 MG PO TBDP: 4.0000 mg | ORAL_TABLET | Freq: Three times a day (TID) | ORAL | 0 refills | Status: DC | PRN

## 2019-06-09 MED ORDER — ONDANSETRON HCL 4 MG/2ML IJ SOLN
4.0000 mg | Freq: Once | INTRAMUSCULAR | Status: AC
  Administered 2019-06-09: 4 mg via INTRAVENOUS
  Filled 2019-06-09: qty 2

## 2019-06-09 MED ORDER — SODIUM CHLORIDE 0.9 % IV SOLN
2.0000 g | Freq: Once | INTRAVENOUS | Status: AC
  Administered 2019-06-09: 2 g via INTRAVENOUS
  Filled 2019-06-09: qty 20

## 2019-06-09 MED ORDER — ONDANSETRON HCL 4 MG/2ML IJ SOLN
4.0000 mg | INTRAMUSCULAR | Status: AC
  Administered 2019-06-09: 4 mg via INTRAVENOUS
  Filled 2019-06-09: qty 2

## 2019-06-09 MED ORDER — HYDROCODONE-ACETAMINOPHEN 5-325 MG PO TABS: 1.0000 | ORAL_TABLET | Freq: Four times a day (QID) | ORAL | 0 refills | Status: AC | PRN

## 2019-06-09 MED ORDER — MORPHINE SULFATE (PF) 4 MG/ML IV SOLN
4.0000 mg | Freq: Once | INTRAVENOUS | Status: AC
  Administered 2019-06-09: 4 mg via INTRAVENOUS
  Filled 2019-06-09: qty 1

## 2019-06-09 MED ORDER — AMOXICILLIN-POT CLAVULANATE 875-125 MG PO TABS: 1.0000 | ORAL_TABLET | Freq: Two times a day (BID) | ORAL | 0 refills | Status: AC

## 2019-06-09 NOTE — Discharge Instructions (Addendum)
As you discussed with Dr. Lysle Pearl  Take the prescribed medications for pain and nausea.  Try to avoid foods high in fat for the next week.  Eat smaller, more frequent meals.

## 2019-06-09 NOTE — ED Notes (Signed)
Pt given peanut butter and graham crackers with water for PO challenge

## 2019-06-09 NOTE — ED Triage Notes (Signed)
Pt arrives POV to triage with c/o chest pain since yesterday at 1600. Pt states that the pain started bilaterally under her breasts and moved into mid sternal pain. Pt is in NAD.

## 2019-06-09 NOTE — ED Provider Notes (Signed)
Patient seen and evaluated by Dr. Lysle Pearl. 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 Her pain is improved and she is tolerating PO. While sx could be 2/2 early cholecystitis, based on shared decision making with Dr. Lysle Pearl, pt will be released with good return precautions. She understands that she may need to return if pain returns or fever, nausea, vomiting, or other sx return and that there is a chance that by leaving, her GB could become more infected, inflamed, and perforate leading to significant morbidity and/or death. Will give dose of Rocephin, d/c on Augmentin, norco, zofran per Dr. Lysle Pearl.   Duffy Bruce, MD 06/09/19 [phone]

## 2019-06-09 NOTE — ED Notes (Signed)
Signature pad in room not working- printed and had pt sign hard copy for discharge

## 2019-06-09 NOTE — ED Notes (Signed)
EDP at bedside  Pt reports central CP with nausea and diarrhea that started at approx 4am, pain worse with palpation and RUQ tender with palpation and inhalation, pt reports hx of gallbladder stones, pt reports some yard work yesterday

## 2019-06-09 NOTE — ED Provider Notes (Signed)
[facility] Emergency Department Provider Note  ____________________________________________   First MD Initiated Contact with Patient 06/09/19 [phone]     (approximate)  I have reviewed the triage vital signs and the nursing notes.   HISTORY  Chief Complaint Chest Pain    HPI Rhonda Brock is a 64 y.o. female with medical history as listed below which also includes gallstones according to her own report.  She presents tonight for evaluation of acute onset and gradually worsening sharp substernal chest pain that radiates through to her back and around both sides.  It is accompanied with nausea.  Symptoms are severe.  Nothing particular makes it better or worse.  The symptoms started 10 to 11 hours ago (around 4:00 PM).  She denies lower abdominal pain, diarrhea, and constipation.  She denies fever, sore throat, shortness of breath.  She does not drink alcohol.  She has no history of diabetes, hypercholesterolemia, and she does not smoke.  She has a history of hypertriglyceridemia and hypertension.  No first-degree relatives with heart disease.   No personal history of blood clots in the legs nor the lungs.     Past Medical History:  Diagnosis Date  . Anxiety   . Back pain   . Chronic headaches   . Cyst (solitary) of breast    bilaterally  . GERD (gastroesophageal reflux disease)   . Hemochromatosis, hereditary ([place]) 03/22/2018  . Hypertension     Patient Active Problem List   Diagnosis Date Noted  . Hemochromatosis, hereditary ([place]) 03/22/2018  . Caregiver with fatigue 06/14/2016  . Hypertension, essential 07/01/2014  . Iron overload 12/09/2011  . Obesity (BMI 30-39.9) 10/17/2011  . Hypertriglyceridemia 10/17/2011    Past Surgical History:  Procedure Laterality Date  . ABDOMINAL HYSTERECTOMY  2001  . APPENDECTOMY  1962  . breast cysts    . BREAST SURGERY  2004, 2005   left breast cyst drained x 2  . COLONOSCOPY  06/08/2006  . COLONOSCOPY  WITH PROPOFOL N/A 06/29/2016   Procedure: COLONOSCOPY WITH PROPOFOL;  Surgeon: Robert Bellow, MD;  Location: [facility] ENDOSCOPY;  Service: Endoscopy;  Laterality: N/A;  . TONSILLECTOMY  1970    Prior to Admission medications   Medication Sig Start Date End Date Taking? Authorizing Provider  calcium carbonate (TUMS - DOSED IN MG ELEMENTAL CALCIUM) 500 MG chewable tablet Chew 1 tablet by mouth daily.    [provider]  hydrochlorothiazide (HYDRODIURIL) 25 MG tablet TAKE 1 TABLET BY MOUTH EVERY DAY 06/03/19   Crecencio Mc, MD    Allergies Patient has no known allergies.  Family History  Problem Relation Age of Onset  . Cancer Mother 51       Breast Cancer-Mastectomy, Also Abdominal cancer  . Asthma Father   . Diabetes Father     Social History Social History   Tobacco Use  . Smoking status: Never Smoker  . Smokeless tobacco: Never Used  Substance Use Topics  . Alcohol use: No  . Drug use: No    Review of Systems Constitutional: No fever/chills Eyes: No visual changes. ENT: No sore throat. Cardiovascular: Lower chest versus upper abdominal pain. Respiratory: Denies shortness of breath. Gastrointestinal: Lower chest versus upper abdominal pain associated with nausea.  No lower abdominal pain.  No diarrhea nor constipation. Genitourinary: Negative for dysuria. Musculoskeletal: Negative for neck pain.  Negative for back pain. Integumentary: Negative for rash. Neurological: Negative for headaches, focal weakness or numbness.   ____________________________________________   PHYSICAL EXAM:  VITAL SIGNS: ED Triage Vitals  Enc Vitals Group     BP 06/09/19 0434 140/76     Pulse Rate 06/09/19 0434 65     Resp 06/09/19 0434 18     Temp 06/09/19 0425 98.2 F (36.8 C)     Temp Source 06/09/19 0425 Oral     SpO2 06/09/19 0434 99 %     Weight 06/09/19 0422 93.9 kg (207 lb)     Height 06/09/19 0422 1.651 m (5\' 5" )     Head Circumference --      Peak Flow --       Pain Score 06/09/19 0422 8     Pain Loc --      Pain Edu? --      Excl. in [place]? --     Constitutional: Alert and oriented.  Appears uncomfortable but not in severe distress. Eyes: Conjunctivae are normal.  Head: Atraumatic. Nose: No congestion/rhinnorhea. Mouth/Throat: Patient is wearing a mask. Neck: No stridor.  No meningeal signs.   Cardiovascular: Normal rate, regular rhythm. Good peripheral circulation. Grossly normal heart sounds. Respiratory: Normal respiratory effort.  No retractions. Gastrointestinal: Soft and nondistended.  Severe tenderness to palpation of the epigastrium and right upper quadrant with a positive Murphy sign.  No lower abdominal tenderness, no rebound and no guarding. Musculoskeletal: No lower extremity tenderness nor edema. No gross deformities of extremities. Neurologic:  Normal speech and language. No gross focal neurologic deficits are appreciated.  Skin:  Skin is warm, dry and intact. Psychiatric: Mood and affect are normal. Speech and behavior are normal.  ____________________________________________   LABS (all labs ordered are listed, but only abnormal results are displayed)  Labs Reviewed  BASIC METABOLIC PANEL - Abnormal; Notable for the following components:      Result Value   Potassium 3.3 (*)    Glucose, Bld 139 (*)    All other components within normal limits  CBC - Abnormal; Notable for the following components:   WBC 17.0 (*)    Hemoglobin 15.3 (*)    All other components within normal limits  HEPATIC FUNCTION PANEL  LIPASE, BLOOD  TROPONIN I (HIGH SENSITIVITY)  TROPONIN I (HIGH SENSITIVITY)   ____________________________________________  EKG  ED ECG REPORT I, Hinda Kehr, the attending physician, personally viewed and interpreted this ECG.  Date: 06/09/2019 EKG Time: 4:29 AM Rate: 67 Rhythm: normal sinus rhythm QRS Axis: normal Intervals: normal ST/T Wave abnormalities: Inverted T waves in lead III, otherwise  unremarkable Narrative Interpretation: no evidence of acute ischemia  ____________________________________________  RADIOLOGY I, Hinda Kehr, personally viewed and evaluated these images (plain radiographs) as part of my medical decision making, as well as reviewing the written report by the radiologist.  ED MD interpretation: No acute abnormality on chest x-ray  Official radiology report(s): DG Chest 2 View  Result Date: 06/09/2019 CLINICAL DATA:  Chest pain EXAM: CHEST - 2 VIEW COMPARISON:  None. FINDINGS: The heart size and mediastinal contours are within normal limits. Both lungs are clear. The visualized skeletal structures are unremarkable. IMPRESSION: No active cardiopulmonary disease. Electronically Signed   By: Ulyses Jarred M.D.   On: 06/09/2019 04:55   US ABDOMEN LIMITED RUQ  Result Date: 06/09/2019 CLINICAL DATA:  Epigastric and right upper quadrant abdominal pain EXAM: ULTRASOUND ABDOMEN LIMITED RIGHT UPPER QUADRANT COMPARISON:  None. FINDINGS: Gallbladder: Layering gallstones, measuring up to 13 mm. Associated gallbladder sludge. Mild gallbladder wall thickening, measuring up to 5 mm. Trace pericholecystic fluid. Patient could not be assessed for  sonographic Murphy's due to pain medication Common bile duct: Diameter: 2 mm Liver: Hyperechoic hepatic parenchyma. No focal hepatic lesion is seen. Portal vein is patent on color Doppler imaging with normal direction of blood flow towards the liver. Other: None. IMPRESSION: Cholelithiasis with mild gallbladder wall thickening/edema. This is nonspecific but raises concern for early acute cholecystitis. Consider hepatobiliary nuclear medicine scan for further evaluation, as clinically warranted. Hepatic steatosis. Electronically Signed   By: Julian Hy M.D.   On: 06/09/2019 07:11    ____________________________________________   PROCEDURES   Procedure(s) performed (including Critical  Care):  Procedures   ____________________________________________   INITIAL IMPRESSION / MDM / [place] / ED COURSE  As part of my medical decision making, I reviewed the following data within the [facility] notes reviewed and incorporated, Labs reviewed , EKG interpreted , Old chart reviewed, A consult was requested and obtained from this/these consultant(s) Surgery and Notes from prior ED visits   Differential diagnosis includes, but is not limited to, biliary colic, cholecystitis, pancreatitis, musculoskeletal strain, AAS, ACS, PE, pneumonia.  The patient is vital signs are stable.  She has a well score for PE is 0.    Basic metabolic panel is notable for some mild hypokalemia.  CBC is notable for a leukocytosis of 17 which is nonspecific.  High-sensitivity troponin is 3.  Chest x-ray is clear and EKG is nonischemic.  I have ordered hepatic function panel and lipase add-ons.  IV with morphine 4 mg IV and Zofran 4 mg IV for pain and nausea.  I have ordered an ultrasound because I think the most likely diagnosis is biliary colic.  She has highly reproducible epigastric tenderness and right upper quadrant tenderness.  She has a low risk for ACS.  Patient understands and agrees with the current plan.  Based on the current presentation and symptoms I doubt AAS and I do not think she would benefit from a CTA chest/abdomen/pelvis at this time.   Clinical Course as of Jun 09 743  Sun Jun 09, 2019  [id number] 2 troponins are reassuring at 3 and 5.  Gallbladder ultrasound official report is not yet back but upon my viewing of the ultrasound, the patient has stones and appears to have gallbladder wall thickening and some pericholecystic fluid.  Anticipate that she will benefit from surgery consultation given the ultrasound findings if the radiology feels these findings are consistent with my interpretation as described above.   [CF]  0710 Troponin I (High Sensitivity):  5 [CF]  0737 Ultrasound report came back and is suggestive of early cholecystitis.  I called and spoke by phone with Dr. Lysle Pearl with general surgery who will come to the ED to evaluate the patient in person and offered disposition recommendations.  I updated the patient as well.  She feels much better after the morphine.  She still is tender to palpation but less so than she was previously.  I asked that she remain n.p.o. and explained that she will see the surgeon before too long.  She agrees with the plan.   [CF]    Clinical Course User Index [CF] Hinda Kehr, MD     ____________________________________________  FINAL CLINICAL IMPRESSION(S) / ED DIAGNOSES  Final diagnoses:  RUQ pain  Nausea  Cholecystitis     MEDICATIONS GIVEN DURING THIS VISIT:  Medications  morphine 4 MG/ML injection 4 mg (4 mg Intravenous Given 06/09/19 0602)  ondansetron (ZOFRAN) injection 4 mg (4 mg Intravenous Given 06/09/19 0600)  ED Discharge Orders    None      *Please note:  Garielle Blake was evaluated in Emergency Department on 06/09/2019 for the symptoms described in the history of present illness. She was evaluated in the context of the global COVID-19 pandemic, which necessitated consideration that the patient might be at risk for infection with the SARS-CoV-2 virus that causes COVID-19. Institutional protocols and algorithms that pertain to the evaluation of patients at risk for COVID-19 are in a state of rapid change based on information released by regulatory bodies including the CDC and federal and state organizations. These policies and algorithms were followed during the patient's care in the ED.  Some ED evaluations and interventions may be delayed as a result of limited staffing during the pandemic.*  Note:  This document was prepared using Dragon voice recognition software and may include unintentional dictation errors.   Hinda Kehr, MD 06/09/19 [phone]

## 2019-06-09 NOTE — Consult Note (Signed)
Subjective:   CC: Acute cholecystitis  HPI:  Rhonda Brock is a 64 y.o. female who is consulted by Karma Greaser for evaluation of above cc.  Symptoms were first noted This am  Pain is sharp started in epigastric region and radiating around both sides and into substernal area.  Associated with nothing specific, exacerbated by any specific     Past Medical History:  has a past medical history of Anxiety, Back pain, Chronic headaches, Cyst (solitary) of breast, GERD (gastroesophageal reflux disease), Hemochromatosis, hereditary ([place]) (03/22/2018), and Hypertension.  Past Surgical History:  has a past surgical history that includes Appendectomy (1962); Tonsillectomy (1970); Abdominal hysterectomy (2001); Breast surgery (2004, 2005); breast cysts; Colonoscopy (06/08/2006); and Colonoscopy with propofol (N/A, 06/29/2016).  Family History: family history includes Asthma in her father; Cancer (age of onset: 74) in her mother; Diabetes in her father.  Social History:  reports that she has never smoked. She has never used smokeless tobacco. She reports that she does not drink alcohol or use drugs.  Current Medications: (Not in a hospital admission)   Allergies:  Allergies as of 06/09/2019  . (No Known Allergies)    ROS:  General: Denies weight loss, weight gain, fatigue, fevers, chills, and night sweats. Eyes: Denies blurry vision, double vision, eye pain, itchy eyes, and tearing. Ears: Denies hearing loss, earache, and ringing in ears. Nose: Denies sinus pain, congestion, infections, runny nose, and nosebleeds. Mouth/throat: Denies hoarseness, sore throat, bleeding gums, and difficulty swallowing. Heart: Denies chest pain, palpitations, racing heart, irregular heartbeat, leg pain or swelling, and decreased activity tolerance. Respiratory: Denies breathing difficulty, shortness of breath, wheezing, cough, and sputum. GI: Denies change in appetite, heartburn, nausea, vomiting, constipation, diarrhea,  and blood in stool. GU: Denies difficulty urinating, pain with urinating, urgency, frequency, blood in urine. Musculoskeletal: Denies joint stiffness, pain, swelling, muscle weakness. Skin: Denies rash, itching, mass, tumors, sores, and boils Neurologic: Denies headache, fainting, dizziness, seizures, numbness, and tingling. Psychiatric: Denies depression, anxiety, difficulty sleeping, and memory loss. Endocrine: Denies heat or cold intolerance, and increased thirst or urination. Blood/lymph: Denies easy bruising, and swollen glands     Objective:     BP (!) 153/79   Pulse (!) 57   Temp 98.2 F (36.8 C) (Oral)   Resp 15   Ht 5\' 5"  (1.651 m)   Wt 93.9 kg   SpO2 99%   BMI 34.45 kg/m    Constitutional :  alert, cooperative, appears stated age and no distress  Lymphatics/Throat:  no asymmetry, masses, or scars  Respiratory:  clear to auscultation bilaterally  Cardiovascular:  regular rate and rhythm  Gastrointestinal: soft, non-tender; bowel sounds normal; no masses,  no organomegaly.   Musculoskeletal: Steady movement  Skin: Cool and moist  Psychiatric: Normal affect, non-agitated, not confused       LABS:  CMP Latest Ref Rng & Units 06/09/2019 04/03/2019 09/24/2018  Glucose 70 - 99 mg/dL 139(H) - 126(H)  BUN 8 - 23 mg/dL 18 - 19  Creatinine 0.44 - 1.00 mg/dL 0.88 - 1.03(H)  Sodium 135 - 145 mmol/L 137 - 141  Potassium 3.5 - 5.1 mmol/L 3.3(L) - 4.1  Chloride 98 - 111 mmol/L 101 - 105  CO2 22 - 32 mmol/L 24 - 28  Calcium 8.9 - 10.3 mg/dL 9.5 - 9.1  Total Protein 6.5 - 8.1 g/dL 7.3 7.0 7.0  Total Bilirubin 0.3 - 1.2 mg/dL 1.0 0.9 0.6  Alkaline Phos 38 - 126 U/L 65 63 58  AST 15 - 41  U/L 24 23 20   ALT 0 - 44 U/L 20 22 17    CBC Latest Ref Rng & Units 06/09/2019 04/03/2019 01/01/2019  WBC 4.0 - 10.5 K/uL 17.0(H) 9.4 -  Hemoglobin 12.0 - 15.0 g/dL 15.3(H) 14.1 14.4  Hematocrit 36.0 - 46.0 % 43.9 42.1 42.5  Platelets 150 - 400 K/uL 310 282 -     RADS: CLINICAL DATA:   Epigastric and right upper quadrant abdominal pain  EXAM: ULTRASOUND ABDOMEN LIMITED RIGHT UPPER QUADRANT  COMPARISON:  None.  FINDINGS: Gallbladder:  Layering gallstones, measuring up to 13 mm. Associated gallbladder sludge. Mild gallbladder wall thickening, measuring up to 5 mm. Trace pericholecystic fluid. Patient could not be assessed for sonographic Murphy's due to pain medication  Common bile duct:  Diameter: 2 mm  Liver:  Hyperechoic hepatic parenchyma. No focal hepatic lesion is seen. Portal vein is patent on color Doppler imaging with normal direction of blood flow towards the liver.  Other: None.  IMPRESSION: Cholelithiasis with mild gallbladder wall thickening/edema. This is nonspecific but raises concern for early acute cholecystitis. Consider hepatobiliary nuclear medicine scan for further evaluation, as clinically warranted.  Hepatic steatosis.   Electronically Signed   By: Julian Hy M.D.   On: 06/09/2019 07:11  Assessment:      Acute cholecystitis  Plan:     Despite the atypical pain pattern, patient was noted to have focal tenderness to palpation in the right upper quadrant during the work-up along with an increased white count and ultrasound findings concerning for acute cholecystitis.  Based on the otherwise negative review of systems, white count likely from the gallbladder, therefore making this acute cholecystitis.    I explained all this to the patient and the typical treatment of proceeding with a operative intervention prior to discharge.  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  I explained to her that with acute cholecystitis, there is some evidence that delaying care beyond 72 hours from initial presentation can increase the risk of perioperative complications.   I explained to her that if she does decide to leave today, will likely pursue antibiotic treatment and proceed with interval lap chole 4 to 6 weeks later.  No guarantee that she will not have recurrence of her symptoms during this interval time.  She verbalized understanding.  Discussed the risk of surgery including post-op infxn, seroma, biloma, chronic pain, poor-delayed wound healing, retained gallstone, conversion to open procedure, post-op SBO or ileus, and need for additional procedures to address said risks.  The risks of general anesthetic including MI, CVA, sudden death or even reaction to anesthetic medications also discussed. Alternatives include continued observation.  Benefits include possible symptom relief, prevention of complications including acute cholecystitis, pancreatitis.  Typical post operative recovery of 3-5 days rest, continued pain in area and incision sites, possible loose stools up to 4-6 weeks, also discussed.  The patient again understands the risks, any and all questions were answered to the patient's satisfaction.  She will call our office to schedule surgery on Monday.  ED return precautions also discussed.

## 2019-06-18 ENCOUNTER — Ambulatory Visit: Payer: Self-pay | Admitting: Surgery

## 2019-06-18 NOTE — H&P (Signed)
Subjective:  CC: Acute cholecystitis  HPI:  Rhonda Brock is a 64 y.o. female who is consulted by Karma Greaser for evaluation of above cc. Symptoms were first noted This am Pain is sharp started in epigastric region and radiating around both sides and into substernal area. Associated with nothing specific, exacerbated by any specific  Past Medical History: has a past medical history of Anxiety, Back pain, Chronic headaches, Cyst (solitary) of breast, GERD (gastroesophageal reflux disease), Hemochromatosis, hereditary ([place]) (03/22/2018), and Hypertension.  Past Surgical History: has a past surgical history that includes Appendectomy (1962); Tonsillectomy (1970); Abdominal hysterectomy (2001); Breast surgery (2004, 2005); breast cysts; Colonoscopy (06/08/2006); and Colonoscopy with propofol (N/A, 06/29/2016).  Family History: family history includes Asthma in her father; Cancer (age of onset: 52) in her mother; Diabetes in her father.  Social History: reports that she has never smoked. She has never used smokeless tobacco. She reports that she does not drink alcohol or use drugs.  Current Medications: (Not in a hospital admission)   Allergies:     Allergies as of 06/09/2019  . (No Known Allergies)   ROS:  General: Denies weight loss, weight gain, fatigue, fevers, chills, and night sweats. Eyes: Denies blurry vision, double vision, eye pain, itchy eyes, and tearing.  Ears: Denies hearing loss, earache, and ringing in ears. Nose: Denies sinus pain, congestion, infections, runny nose, and nosebleeds. Mouth/throat: Denies hoarseness, sore throat, bleeding gums, and difficulty swallowing. Heart: Denies chest pain, palpitations, racing heart, irregular heartbeat, leg pain or swelling, and decreased activity tolerance. Respiratory: Denies breathing difficulty, shortness of breath, wheezing, cough, and sputum. GI: Denies change in appetite, heartburn, nausea, vomiting, constipation, diarrhea, and blood in  stool. GU: Denies difficulty urinating, pain with urinating, urgency, frequency, blood in urine.  Musculoskeletal: Denies joint stiffness, pain, swelling, muscle weakness.  Skin: Denies rash, itching, mass, tumors, sores, and boils  Neurologic: Denies headache, fainting, dizziness, seizures, numbness, and tingling. Psychiatric: Denies depression, anxiety, difficulty sleeping, and memory loss. Endocrine: Denies heat or cold intolerance, and increased thirst or urination. Blood/lymph: Denies easy bruising, and swollen glands Objective:   BP (!) 153/79  Pulse (!) 57  Temp 98.2 F (36.8 C) (Oral)  Resp 15  Ht 5\' 5"  (1.651 m)  Wt 93.9 kg  SpO2 99%  BMI 34.45 kg/m  Constitutional :  alert, cooperative, appears stated age and no distress  Lymphatics/Throat:  no asymmetry, masses, or scars  Respiratory:  clear to auscultation bilaterally  Cardiovascular:  regular rate and rhythm  Gastrointestinal: soft, non-tender; bowel sounds normal; no masses, no organomegaly.   Musculoskeletal: Steady movement  Skin: Cool and moist  Psychiatric: Normal affect, non-agitated, not confused     LABS:  CMP Latest Ref Rng & Units 06/09/2019 04/03/2019 09/24/2018  Glucose 70 - 99 mg/dL 139(H) - 126(H)  BUN 8 - 23 mg/dL 18 - 19  Creatinine 0.44 - 1.00 mg/dL 0.88 - 1.03(H)  Sodium 135 - 145 mmol/L 137 - 141  Potassium 3.5 - 5.1 mmol/L 3.3(L) - 4.1  Chloride 98 - 111 mmol/L 101 - 105  CO2 22 - 32 mmol/L 24 - 28  Calcium 8.9 - 10.3 mg/dL 9.5 - 9.1  Total Protein 6.5 - 8.1 g/dL 7.3 7.0 7.0  Total Bilirubin 0.3 - 1.2 mg/dL 1.0 0.9 0.6  Alkaline Phos 38 - 126 U/L 65 63 58  AST 15 - 41 U/L 24 23 20   ALT 0 - 44 U/L 20 22 17    CBC Latest Ref Rng & Units 06/09/2019  04/03/2019 01/01/2019  WBC 4.0 - 10.5 K/uL 17.0(H) 9.4 -  Hemoglobin 12.0 - 15.0 g/dL 15.3(H) 14.1 14.4  Hematocrit 36.0 - 46.0 % 43.9 42.1 42.5  Platelets 150 - 400 K/uL 310 282 -   RADS:  CLINICAL DATA: Epigastric and right upper quadrant abdominal  pain  EXAM:  ULTRASOUND ABDOMEN LIMITED RIGHT UPPER QUADRANT  COMPARISON: None.  FINDINGS:  Gallbladder:  Layering gallstones, measuring up to 13 mm. Associated gallbladder  sludge. Mild gallbladder wall thickening, measuring up to 5 mm.  Trace pericholecystic fluid. Patient could not be assessed for  sonographic Murphy's due to pain medication  Common bile duct:  Diameter: 2 mm  Liver:  Hyperechoic hepatic parenchyma. No focal hepatic lesion is seen.  Portal vein is patent on color Doppler imaging with normal direction  of blood flow towards the liver.  Other: None.  IMPRESSION:  Cholelithiasis with mild gallbladder wall thickening/edema. This is  nonspecific but raises concern for early acute cholecystitis.  Consider hepatobiliary nuclear medicine scan for further evaluation,  as clinically warranted.  Hepatic steatosis.  Electronically Signed  By: Julian Hy M.D.  On: 06/09/2019 07:11  Assessment:   Acute cholecystitis  Plan:   Despite the atypical pain pattern, patient was noted to have focal tenderness to palpation in the right upper quadrant during the work-up along with an increased white count and ultrasound findings concerning for acute cholecystitis. Based on the otherwise negative review of systems, white count likely from the gallbladder, therefore making this acute cholecystitis.  I explained all this to the patient and the typical treatment of proceeding with a operative intervention prior to discharge. 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 I explained to her that with acute cholecystitis, there is some evidence that delaying care beyond 72 hours from initial presentation can increase the risk of perioperative complications. I explained to her that if she does decide to leave today, will likely pursue  antibiotic treatment and proceed with interval lap chole 4 to 6 weeks later. No guarantee that she will not have recurrence of her symptoms during this interval time. She verbalized understanding.  Discussed the risk of surgery including post-op infxn, seroma, biloma, chronic pain, poor-delayed wound healing, retained gallstone, conversion to open procedure, post-op SBO or ileus, and need for additional procedures to address said risks. The risks of general anesthetic including MI, CVA, sudden death or even reaction to anesthetic medications also discussed. Alternatives include continued observation. Benefits include possible symptom relief, prevention of complications including acute cholecystitis, pancreatitis.  Typical post operative recovery of 3-5 days rest, continued pain in area and incision sites, possible loose stools up to 4-6 weeks, also discussed.  The patient again understands the risks, any and all questions were answered to the patient's satisfaction.  ED return precautions also discussed.

## 2019-06-18 NOTE — H&P (View-Only) (Signed)
Subjective:  CC: Acute cholecystitis  HPI:  Rhonda Brock is a 64 y.o. female who is consulted by Karma Greaser for evaluation of above cc. Symptoms were first noted This am Pain is sharp started in epigastric region and radiating around both sides and into substernal area. Associated with nothing specific, exacerbated by any specific  Past Medical History: has a past medical history of Anxiety, Back pain, Chronic headaches, Cyst (solitary) of breast, GERD (gastroesophageal reflux disease), Hemochromatosis, hereditary ([place]) (03/22/2018), and Hypertension.  Past Surgical History: has a past surgical history that includes Appendectomy (1962); Tonsillectomy (1970); Abdominal hysterectomy (2001); Breast surgery (2004, 2005); breast cysts; Colonoscopy (06/08/2006); and Colonoscopy with propofol (N/A, 06/29/2016).  Family History: family history includes Asthma in her father; Cancer (age of onset: 58) in her mother; Diabetes in her father.  Social History: reports that she has never smoked. She has never used smokeless tobacco. She reports that she does not drink alcohol or use drugs.  Current Medications: (Not in a hospital admission)   Allergies:     Allergies as of 06/09/2019  . (No Known Allergies)   ROS:  General: Denies weight loss, weight gain, fatigue, fevers, chills, and night sweats. Eyes: Denies blurry vision, double vision, eye pain, itchy eyes, and tearing.  Ears: Denies hearing loss, earache, and ringing in ears. Nose: Denies sinus pain, congestion, infections, runny nose, and nosebleeds. Mouth/throat: Denies hoarseness, sore throat, bleeding gums, and difficulty swallowing. Heart: Denies chest pain, palpitations, racing heart, irregular heartbeat, leg pain or swelling, and decreased activity tolerance. Respiratory: Denies breathing difficulty, shortness of breath, wheezing, cough, and sputum. GI: Denies change in appetite, heartburn, nausea, vomiting, constipation, diarrhea, and blood in  stool. GU: Denies difficulty urinating, pain with urinating, urgency, frequency, blood in urine.  Musculoskeletal: Denies joint stiffness, pain, swelling, muscle weakness.  Skin: Denies rash, itching, mass, tumors, sores, and boils  Neurologic: Denies headache, fainting, dizziness, seizures, numbness, and tingling. Psychiatric: Denies depression, anxiety, difficulty sleeping, and memory loss. Endocrine: Denies heat or cold intolerance, and increased thirst or urination. Blood/lymph: Denies easy bruising, and swollen glands Objective:   BP (!) 153/79  Pulse (!) 57  Temp 98.2 F (36.8 C) (Oral)  Resp 15  Ht 5\' 5"  (1.651 m)  Wt 93.9 kg  SpO2 99%  BMI 34.45 kg/m  Constitutional :  alert, cooperative, appears stated age and no distress  Lymphatics/Throat:  no asymmetry, masses, or scars  Respiratory:  clear to auscultation bilaterally  Cardiovascular:  regular rate and rhythm  Gastrointestinal: soft, non-tender; bowel sounds normal; no masses, no organomegaly.   Musculoskeletal: Steady movement  Skin: Cool and moist  Psychiatric: Normal affect, non-agitated, not confused     LABS:  CMP Latest Ref Rng & Units 06/09/2019 04/03/2019 09/24/2018  Glucose 70 - 99 mg/dL 139(H) - 126(H)  BUN 8 - 23 mg/dL 18 - 19  Creatinine 0.44 - 1.00 mg/dL 0.88 - 1.03(H)  Sodium 135 - 145 mmol/L 137 - 141  Potassium 3.5 - 5.1 mmol/L 3.3(L) - 4.1  Chloride 98 - 111 mmol/L 101 - 105  CO2 22 - 32 mmol/L 24 - 28  Calcium 8.9 - 10.3 mg/dL 9.5 - 9.1  Total Protein 6.5 - 8.1 g/dL 7.3 7.0 7.0  Total Bilirubin 0.3 - 1.2 mg/dL 1.0 0.9 0.6  Alkaline Phos 38 - 126 U/L 65 63 58  AST 15 - 41 U/L 24 23 20   ALT 0 - 44 U/L 20 22 17    CBC Latest Ref Rng & Units 06/09/2019  04/03/2019 01/01/2019  WBC 4.0 - 10.5 K/uL 17.0(H) 9.4 -  Hemoglobin 12.0 - 15.0 g/dL 15.3(H) 14.1 14.4  Hematocrit 36.0 - 46.0 % 43.9 42.1 42.5  Platelets 150 - 400 K/uL 310 282 -   RADS:  CLINICAL DATA: Epigastric and right upper quadrant abdominal  pain  EXAM:  ULTRASOUND ABDOMEN LIMITED RIGHT UPPER QUADRANT  COMPARISON: None.  FINDINGS:  Gallbladder:  Layering gallstones, measuring up to 13 mm. Associated gallbladder  sludge. Mild gallbladder wall thickening, measuring up to 5 mm.  Trace pericholecystic fluid. Patient could not be assessed for  sonographic Murphy's due to pain medication  Common bile duct:  Diameter: 2 mm  Liver:  Hyperechoic hepatic parenchyma. No focal hepatic lesion is seen.  Portal vein is patent on color Doppler imaging with normal direction  of blood flow towards the liver.  Other: None.  IMPRESSION:  Cholelithiasis with mild gallbladder wall thickening/edema. This is  nonspecific but raises concern for early acute cholecystitis.  Consider hepatobiliary nuclear medicine scan for further evaluation,  as clinically warranted.  Hepatic steatosis.  Electronically Signed  By: Julian Hy M.D.  On: 06/09/2019 07:11  Assessment:   Acute cholecystitis  Plan:   Despite the atypical pain pattern, patient was noted to have focal tenderness to palpation in the right upper quadrant during the work-up along with an increased white count and ultrasound findings concerning for acute cholecystitis. Based on the otherwise negative review of systems, white count likely from the gallbladder, therefore making this acute cholecystitis.  I explained all this to the patient and the typical treatment of proceeding with a operative intervention prior to discharge. 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 I explained to her that with acute cholecystitis, there is some evidence that delaying care beyond 72 hours from initial presentation can increase the risk of perioperative complications. I explained to her that if she does decide to leave today, will likely pursue  antibiotic treatment and proceed with interval lap chole 4 to 6 weeks later. No guarantee that she will not have recurrence of her symptoms during this interval time. She verbalized understanding.  Discussed the risk of surgery including post-op infxn, seroma, biloma, chronic pain, poor-delayed wound healing, retained gallstone, conversion to open procedure, post-op SBO or ileus, and need for additional procedures to address said risks. The risks of general anesthetic including MI, CVA, sudden death or even reaction to anesthetic medications also discussed. Alternatives include continued observation. Benefits include possible symptom relief, prevention of complications including acute cholecystitis, pancreatitis.  Typical post operative recovery of 3-5 days rest, continued pain in area and incision sites, possible loose stools up to 4-6 weeks, also discussed.  The patient again understands the risks, any and all questions were answered to the patient's satisfaction.  ED return precautions also discussed.

## 2019-06-24 ENCOUNTER — Other Ambulatory Visit
Admission: RE | Admit: 2019-06-24 | Discharge: 2019-06-24 | Disposition: A | Discharge status: Home / Self Care | Payer: 59 | Source: Ambulatory Visit | Attending: Surgery | Admitting: Surgery

## 2019-06-24 ENCOUNTER — Other Ambulatory Visit: Payer: Self-pay

## 2019-06-24 NOTE — Patient Instructions (Signed)
Your procedure is scheduled on: Friday June 28, 2019 Report to Day Surgery. To find out your arrival time please call [phone] between 1PM - 3PM on Thursday June 27, 2019.  Remember: Instructions that are not followed completely may result in serious medical risk,  up to and including death, or upon the discretion of your surgeon and anesthesiologist your  surgery may need to be rescheduled.     _X__ 1. Do not eat food after midnight the night before your procedure.                 No gum chewing or hard candies. You may drink clear liquids up to 2 hours                 before you are scheduled to arrive for your surgery- DO not drink clear                 liquids within 2 hours of the start of your surgery.                 Clear Liquids include:  water, apple juice without pulp, clear Gatorade, G2 or                  Gatorade Zero (avoid Red/Purple/Blue), Black Coffee or Tea (Do not add                 anything to coffee or tea).  __X__2.  On the morning of surgery brush your teeth with toothpaste and water, you                may rinse your mouth with mouthwash if you wish.  Do not swallow any toothpaste of mouthwash.     _X__ 3.  No Alcohol for 24 hours before or after surgery.   _X__ 4.  Do Not Smoke or use e-cigarettes For 24 Hours Prior to Your Surgery.                 Do not use any chewable tobacco products for at least 6 hours prior to                 surgery.  __x__ 5.  Notify your doctor if there is any change in your medical condition      (cold, fever, infections).     Do not wear jewelry, make-up, hairpins, clips or nail polish. Do not wear lotions, powders, or perfumes. You may wear deodorant. Do not shave 48 hours prior to surgery. Men may shave face and neck. Do not bring valuables to the hospital.    [facility] is not responsible for any belongings or valuables.  Contacts, dentures or bridgework may not be worn into surgery. Leave  your suitcase in the car. After surgery it may be brought to your room. For patients admitted to the hospital, discharge time is determined by your treatment team.   Patients discharged the day of surgery will not be allowed to drive home.   Make arrangements for someone to be with you for the first 24 hours of your Same Day Discharge.   __x__ Take these medicines the morning of surgery with A SIP OF WATER:    1. none   __x__ Use CHG Soap as directed  __x__ Stop Anti-inflammatories such as ibuprofen, Aleve, Advil, naproxen, aspirin and or BC powders.   __x__ Stop supplements until after surgery.    __x__ Do not start any herbal supplements before  your surgery.

## 2019-06-26 ENCOUNTER — Other Ambulatory Visit: Payer: Self-pay

## 2019-06-26 ENCOUNTER — Other Ambulatory Visit
Admission: RE | Admit: 2019-06-26 | Discharge: 2019-06-26 | Disposition: A | Discharge status: Home / Self Care | Payer: 59 | Source: Ambulatory Visit | Attending: Surgery | Admitting: Surgery

## 2019-06-26 DIAGNOSIS — Z01812 Encounter for preprocedural laboratory examination: Secondary | ICD-10-CM | POA: Diagnosis present

## 2019-06-26 DIAGNOSIS — Z20822 Contact with and (suspected) exposure to covid-19: Secondary | ICD-10-CM | POA: Insufficient documentation

## 2019-06-26 LAB — BASIC METABOLIC PANEL
Anion gap: 9 (ref 5–15)
BUN: 17 mg/dL (ref 8–23)
CO2: 29 mmol/L (ref 22–32)
Calcium: 9.6 mg/dL (ref 8.9–10.3)
Chloride: 104 mmol/L (ref 98–111)
Creatinine, Ser: 0.98 mg/dL (ref 0.44–1.00)
GFR calc Af Amer: >60 mL/min (ref >60)
GFR calc non Af Amer: >60 mL/min (ref >60)
Glucose, Bld: 120 mg/dL — ABNORMAL HIGH (ref 70–99)
Potassium: 3.5 mmol/L (ref 3.5–5.1)
Sodium: 142 mmol/L (ref 135–145)

## 2019-06-26 LAB — SARS CORONAVIRUS 2 (TAT 6-24 HRS): SARS Coronavirus 2: NEGATIVE

## 2019-06-27 HISTORY — PX: CHOLECYSTECTOMY: SHX55

## 2019-06-27 MED ORDER — INDOCYANINE GREEN 25 MG IV SOLR
7.5000 mg | Freq: Once | INTRAVENOUS | Status: DC
  Filled 2019-06-27: qty 10

## 2019-06-28 ENCOUNTER — Ambulatory Visit: Payer: 59 | Admitting: Anesthesiology

## 2019-06-28 ENCOUNTER — Encounter
Admission: RE | Disposition: A | Discharge status: Home / Self Care | Payer: Self-pay | Source: Home / Self Care | Attending: Surgery

## 2019-06-28 ENCOUNTER — Encounter: Payer: Self-pay | Admitting: Surgery

## 2019-06-28 ENCOUNTER — Ambulatory Visit
Admission: RE | Admit: 2019-06-28 | Discharge: 2019-06-28 | Disposition: A | Discharge status: Home / Self Care | Payer: 59 | Attending: Surgery | Admitting: Surgery

## 2019-06-28 ENCOUNTER — Other Ambulatory Visit: Payer: Self-pay

## 2019-06-28 DIAGNOSIS — I10 Essential (primary) hypertension: Secondary | ICD-10-CM | POA: Diagnosis not present

## 2019-06-28 DIAGNOSIS — K8010 Calculus of gallbladder with chronic cholecystitis without obstruction: Secondary | ICD-10-CM | POA: Diagnosis not present

## 2019-06-28 DIAGNOSIS — K811 Chronic cholecystitis: Secondary | ICD-10-CM | POA: Diagnosis present

## 2019-06-28 DIAGNOSIS — K810 Acute cholecystitis: Secondary | ICD-10-CM

## 2019-06-28 SURGERY — CHOLECYSTECTOMY, ROBOT-ASSISTED, LAPAROSCOPIC
Anesthesia: General | Site: Abdomen

## 2019-06-28 MED ORDER — HYDROCODONE-ACETAMINOPHEN 5-325 MG PO TABS: 1.0000 | ORAL_TABLET | Freq: Once | ORAL | Status: AC

## 2019-06-28 MED ORDER — CHLORHEXIDINE GLUCONATE CLOTH 2 % EX PADS
6.0000 | MEDICATED_PAD | Freq: Once | CUTANEOUS | Status: AC
  Administered 2019-06-28: 07:00:00 6 via TOPICAL

## 2019-06-28 MED ORDER — SODIUM CHLORIDE FLUSH 0.9 % IV SOLN
INTRAVENOUS | Status: AC
  Filled 2019-06-28: qty 10

## 2019-06-28 MED ORDER — CEFAZOLIN SODIUM-DEXTROSE 2-4 GM/100ML-% IV SOLN
2.0000 g | INTRAVENOUS | Status: AC
  Administered 2019-06-28: 2 g via INTRAVENOUS

## 2019-06-28 MED ORDER — PROPOFOL 500 MG/50ML IV EMUL
INTRAVENOUS | Status: AC
  Filled 2019-06-28: qty 50

## 2019-06-28 MED ORDER — ACETAMINOPHEN 10 MG/ML IV SOLN
INTRAVENOUS | Status: AC
  Filled 2019-06-28: qty 200

## 2019-06-28 MED ORDER — OXYCODONE HCL 5 MG/5ML PO SOLN: 5.0000 mg | Freq: Once | ORAL | Status: DC | PRN

## 2019-06-28 MED ORDER — HYDROCODONE-ACETAMINOPHEN 5-325 MG PO TABS
ORAL_TABLET | ORAL | Status: AC
  Administered 2019-06-28: 1 via ORAL
  Filled 2019-06-28: qty 1

## 2019-06-28 MED ORDER — ONDANSETRON HCL 4 MG/2ML IJ SOLN
INTRAMUSCULAR | Status: AC
  Filled 2019-06-28: qty 4

## 2019-06-28 MED ORDER — FAMOTIDINE 20 MG PO TABS
ORAL_TABLET | ORAL | Status: AC
  Administered 2019-06-28: 20 mg via ORAL
  Filled 2019-06-28: qty 1

## 2019-06-28 MED ORDER — GLYCOPYRROLATE 0.2 MG/ML IJ SOLN
INTRAMUSCULAR | Status: AC
  Filled 2019-06-28: qty 2

## 2019-06-28 MED ORDER — FENTANYL CITRATE (PF) 100 MCG/2ML IJ SOLN
INTRAMUSCULAR | Status: AC
  Filled 2019-06-28: qty 2

## 2019-06-28 MED ORDER — PROPOFOL 10 MG/ML IV BOLUS
INTRAVENOUS | Status: AC
  Filled 2019-06-28: qty 40

## 2019-06-28 MED ORDER — OXYCODONE HCL 5 MG PO TABS: 5.0000 mg | ORAL_TABLET | Freq: Once | ORAL | Status: DC | PRN

## 2019-06-28 MED ORDER — CEFAZOLIN SODIUM-DEXTROSE 2-4 GM/100ML-% IV SOLN
INTRAVENOUS | Status: AC
  Filled 2019-06-28: qty 100

## 2019-06-28 MED ORDER — LIDOCAINE-EPINEPHRINE (PF) 1 %-1:200000 IJ SOLN
INTRAMUSCULAR | Status: AC
  Filled 2019-06-28: qty 30

## 2019-06-28 MED ORDER — BUPIVACAINE HCL (PF) 0.5 % IJ SOLN
INTRAMUSCULAR | Status: DC | PRN
  Administered 2019-06-28: 10 mL

## 2019-06-28 MED ORDER — EPHEDRINE 5 MG/ML INJ
INTRAVENOUS | Status: AC
  Filled 2019-06-28: qty 10

## 2019-06-28 MED ORDER — MIDAZOLAM HCL 2 MG/2ML IJ SOLN
INTRAMUSCULAR | Status: AC
  Filled 2019-06-28: qty 2

## 2019-06-28 MED ORDER — ONDANSETRON HCL 4 MG/2ML IJ SOLN
INTRAMUSCULAR | Status: DC | PRN
  Administered 2019-06-28: 4 mg via INTRAVENOUS

## 2019-06-28 MED ORDER — INDOCYANINE GREEN 25 MG IV SOLR
1.2500 mg | Freq: Once | INTRAVENOUS | Status: AC
  Administered 2019-06-28: 09:00:00 1.25 mg via INTRAVENOUS

## 2019-06-28 MED ORDER — LABETALOL HCL 5 MG/ML IV SOLN
INTRAVENOUS | Status: DC | PRN
  Administered 2019-06-28: 2.5 mg via INTRAVENOUS
  Administered 2019-06-28 (×2): 5 mg via INTRAVENOUS
  Administered 2019-06-28: 2.5 mg via INTRAVENOUS

## 2019-06-28 MED ORDER — FENTANYL CITRATE (PF) 100 MCG/2ML IJ SOLN
INTRAMUSCULAR | Status: AC
  Administered 2019-06-28: 25 ug via INTRAVENOUS
  Filled 2019-06-28: qty 2

## 2019-06-28 MED ORDER — PROPOFOL 10 MG/ML IV BOLUS
INTRAVENOUS | Status: AC
  Filled 2019-06-28: qty 20

## 2019-06-28 MED ORDER — LIDOCAINE-EPINEPHRINE (PF) 1 %-1:200000 IJ SOLN
INTRAMUSCULAR | Status: DC | PRN
  Administered 2019-06-28: 10 mL

## 2019-06-28 MED ORDER — SUGAMMADEX SODIUM 200 MG/2ML IV SOLN
INTRAVENOUS | Status: DC | PRN
  Administered 2019-06-28: 200 mg via INTRAVENOUS

## 2019-06-28 MED ORDER — DEXAMETHASONE SODIUM PHOSPHATE 10 MG/ML IJ SOLN
INTRAMUSCULAR | Status: DC | PRN
  Administered 2019-06-28: 10 mg via INTRAVENOUS

## 2019-06-28 MED ORDER — FENTANYL CITRATE (PF) 100 MCG/2ML IJ SOLN: 25.0000 ug | INTRAMUSCULAR | Status: DC | PRN

## 2019-06-28 MED ORDER — PROPOFOL 10 MG/ML IV BOLUS
INTRAVENOUS | Status: DC | PRN
  Administered 2019-06-28: 50 mg via INTRAVENOUS
  Administered 2019-06-28: 125 ug/kg/min via INTRAVENOUS

## 2019-06-28 MED ORDER — DEXAMETHASONE SODIUM PHOSPHATE 10 MG/ML IJ SOLN
INTRAMUSCULAR | Status: AC
  Filled 2019-06-28: qty 1

## 2019-06-28 MED ORDER — MIDAZOLAM HCL 2 MG/2ML IJ SOLN
INTRAMUSCULAR | Status: DC | PRN
  Administered 2019-06-28: 2 mg via INTRAVENOUS

## 2019-06-28 MED ORDER — DOCUSATE SODIUM 100 MG PO CAPS: 100.0000 mg | ORAL_CAPSULE | Freq: Two times a day (BID) | ORAL | 0 refills | Status: AC | PRN

## 2019-06-28 MED ORDER — ROCURONIUM BROMIDE 100 MG/10ML IV SOLN
INTRAVENOUS | Status: DC | PRN
  Administered 2019-06-28: 60 mg via INTRAVENOUS

## 2019-06-28 MED ORDER — ACETAMINOPHEN 10 MG/ML IV SOLN
INTRAVENOUS | Status: DC | PRN
  Administered 2019-06-28: 1000 mg via INTRAVENOUS

## 2019-06-28 MED ORDER — FAMOTIDINE 20 MG PO TABS: 20.0000 mg | ORAL_TABLET | Freq: Once | ORAL | Status: AC

## 2019-06-28 MED ORDER — GLYCOPYRROLATE 0.2 MG/ML IJ SOLN
INTRAMUSCULAR | Status: DC | PRN
  Administered 2019-06-28: .2 mg via INTRAVENOUS

## 2019-06-28 MED ORDER — PHENYLEPHRINE HCL (PRESSORS) 10 MG/ML IV SOLN
INTRAVENOUS | Status: AC
  Filled 2019-06-28: qty 1

## 2019-06-28 MED ORDER — LACTATED RINGERS IV SOLN: INTRAVENOUS | Status: DC

## 2019-06-28 MED ORDER — BUPIVACAINE HCL (PF) 0.5 % IJ SOLN
INTRAMUSCULAR | Status: AC
  Filled 2019-06-28: qty 30

## 2019-06-28 MED ORDER — ACETAMINOPHEN 325 MG PO TABS: 650.0000 mg | ORAL_TABLET | Freq: Three times a day (TID) | ORAL | 0 refills | Status: AC | PRN

## 2019-06-28 MED ORDER — KETOROLAC TROMETHAMINE 30 MG/ML IJ SOLN
INTRAMUSCULAR | Status: DC | PRN
  Administered 2019-06-28: 30 mg via INTRAVENOUS

## 2019-06-28 MED ORDER — IBUPROFEN 800 MG PO TABS: 800.0000 mg | ORAL_TABLET | Freq: Three times a day (TID) | ORAL | 0 refills | Status: DC | PRN

## 2019-06-28 MED ORDER — FENTANYL CITRATE (PF) 100 MCG/2ML IJ SOLN
INTRAMUSCULAR | Status: DC | PRN
  Administered 2019-06-28: 25 ug via INTRAVENOUS
  Administered 2019-06-28: 50 ug via INTRAVENOUS
  Administered 2019-06-28: 25 ug via INTRAVENOUS
  Administered 2019-06-28: 75 ug via INTRAVENOUS
  Administered 2019-06-28: 25 ug via INTRAVENOUS

## 2019-06-28 SURGICAL SUPPLY — 46 items
ANCHOR TIS RET SYS 235ML (MISCELLANEOUS) ×4 IMPLANT
BAG INFUSER PRESSURE 100CC (MISCELLANEOUS) ×4 IMPLANT
BLADE SURG SZ11 CARB STEEL (BLADE) ×4 IMPLANT
CANISTER SUCT 1200ML W/VALVE (MISCELLANEOUS) ×4 IMPLANT
CANNULA REDUC XI 12-8 STAPL (CANNULA) ×1
CANNULA REDUC XI 12-8MM STAPL (CANNULA) ×1
CANNULA REDUCER 12-8 DVNC XI (CANNULA) ×2 IMPLANT
CHLORAPREP W/TINT 26 (MISCELLANEOUS) ×4 IMPLANT
CLIP VESOLOCK MED LG 6/CT (CLIP) ×4 IMPLANT
COVER TIP SHEARS 8 DVNC (MISCELLANEOUS) ×2 IMPLANT
COVER TIP SHEARS 8MM DA VINCI (MISCELLANEOUS) ×2
DECANTER SPIKE VIAL GLASS SM (MISCELLANEOUS) ×8 IMPLANT
DEFOGGER SCOPE WARMER CLEARIFY (MISCELLANEOUS) ×4 IMPLANT
DERMABOND ADVANCED (GAUZE/BANDAGES/DRESSINGS) ×2
DERMABOND ADVANCED .7 DNX12 (GAUZE/BANDAGES/DRESSINGS) ×2 IMPLANT
DRAPE ARM DVNC X/XI (DISPOSABLE) ×8 IMPLANT
DRAPE COLUMN DVNC XI (DISPOSABLE) ×2 IMPLANT
DRAPE DA VINCI XI ARM (DISPOSABLE) ×8
DRAPE DA VINCI XI COLUMN (DISPOSABLE) ×2
ELECT CAUTERY BLADE 6.4 (BLADE) ×4 IMPLANT
ELECT REM PT RETURN 9FT ADLT (ELECTROSURGICAL) ×4
ELECTRODE REM PT RTRN 9FT ADLT (ELECTROSURGICAL) ×2 IMPLANT
GLOVE BIOGEL PI IND STRL 7.0 (GLOVE) ×4 IMPLANT
GLOVE BIOGEL PI INDICATOR 7.0 (GLOVE) ×4
GLOVE SURG SYN 6.5 ES PF (GLOVE) ×8 IMPLANT
GOWN STRL REUS W/ TWL LRG LVL3 (GOWN DISPOSABLE) ×6 IMPLANT
GOWN STRL REUS W/TWL LRG LVL3 (GOWN DISPOSABLE) ×6
IV NS 1000ML (IV SOLUTION) ×2
IV NS 1000ML BAXH (IV SOLUTION) ×2 IMPLANT
LABEL OR SOLS (LABEL) ×4 IMPLANT
NEEDLE HYPO 22GX1.5 SAFETY (NEEDLE) ×4 IMPLANT
NEEDLE INSUFFLATION 14GA 120MM (NEEDLE) ×4 IMPLANT
NS IRRIG 500ML POUR BTL (IV SOLUTION) ×4 IMPLANT
OBTURATOR OPTICAL STANDARD 8MM (TROCAR) ×2
OBTURATOR OPTICAL STND 8 DVNC (TROCAR) ×2
OBTURATOR OPTICALSTD 8 DVNC (TROCAR) ×2 IMPLANT
PACK LAP CHOLECYSTECTOMY (MISCELLANEOUS) ×4 IMPLANT
PENCIL ELECTRO HAND CTR (MISCELLANEOUS) ×4 IMPLANT
SEAL CANN UNIV 5-8 DVNC XI (MISCELLANEOUS) ×8 IMPLANT
SEAL XI 5MM-8MM UNIVERSAL (MISCELLANEOUS) ×8
SET TUBE SMOKE EVAC HIGH FLOW (TUBING) ×4 IMPLANT
SOLUTION ELECTROLUBE (MISCELLANEOUS) ×4 IMPLANT
SUT MNCRL 4-0 (SUTURE) ×4
SUT MNCRL 4-0 27XMFL (SUTURE) ×4
SUT VICRYL 0 AB UR-6 (SUTURE) ×4 IMPLANT
SUTURE MNCRL 4-0 27XMF (SUTURE) ×4 IMPLANT

## 2019-06-28 NOTE — Anesthesia Preprocedure Evaluation (Signed)
Anesthesia Evaluation  Patient identified by MRN, date of birth, ID band Patient awake    Reviewed: Allergy & Precautions, H&P , NPO status , Patient's Chart, lab work & pertinent test results  History of Anesthesia Complications Negative for: history of anesthetic complications  Airway Mallampati: III  TM Distance: <3 FB Neck ROM: full    Dental  (+) Chipped   Pulmonary neg pulmonary ROS, neg shortness of breath,           Cardiovascular Exercise Tolerance: Good hypertension, (-) angina(-) Past MI and (-) DOE      Neuro/Psych  Headaches, PSYCHIATRIC DISORDERS    GI/Hepatic Neg liver ROS, GERD  Medicated and Controlled,  Endo/Other  negative endocrine ROS  Renal/GU      Musculoskeletal   Abdominal   Peds  Hematology negative hematology ROS (+)   Anesthesia Other Findings Past Medical History: No date: Anxiety No date: Back pain No date: Chronic headaches No date: Cyst (solitary) of breast     Comment:  bilaterally No date: GERD (gastroesophageal reflux disease) 03/22/2018: Hemochromatosis, hereditary ([place]) No date: Hypertension  Past Surgical History: 2001: ABDOMINAL HYSTERECTOMY 1962: APPENDECTOMY No date: breast cysts 2004, 2005: BREAST SURGERY     Comment:  left breast cyst drained x 2 06/08/2006: COLONOSCOPY 06/29/2016: COLONOSCOPY WITH PROPOFOL; N/A     Comment:  Procedure: COLONOSCOPY WITH PROPOFOL;  Surgeon: Robert Bellow, MD;  Location: [facility] ENDOSCOPY;  Service:               Endoscopy;  Laterality: N/A; 1970: TONSILLECTOMY     Reproductive/Obstetrics negative OB ROS                             Anesthesia Physical Anesthesia Plan  ASA: III  Anesthesia Plan: General ETT   Post-op Pain Management:    Induction: Intravenous  PONV Risk Score and Plan: Ondansetron, Dexamethasone, Midazolam and Treatment may vary due to age or medical  condition  Airway Management Planned: Oral ETT  Additional Equipment:   Intra-op Plan:   Post-operative Plan: Extubation in OR  Informed Consent: I have reviewed the patients History and Physical, chart, labs and discussed the procedure including the risks, benefits and alternatives for the proposed anesthesia with the patient or authorized representative who has indicated his/her understanding and acceptance.     Dental Advisory Given  Plan Discussed with: Anesthesiologist, CRNA and Surgeon  Anesthesia Plan Comments: (Patient consented for risks of anesthesia including but not limited to:  - adverse reactions to medications - damage to teeth, lips or other oral mucosa - sore throat or hoarseness - Damage to heart, brain, lungs or loss of life  Patient voiced understanding.)        Anesthesia Quick Evaluation

## 2019-06-28 NOTE — Transfer of Care (Signed)
Immediate Anesthesia Transfer of Care Note  Patient: Rhonda Brock  Procedure(s) Performed: XI ROBOTIC ASSISTED LAPAROSCOPIC CHOLECYSTECTOMY (N/A Abdomen) INDOCYANINE GREEN FLUORESCENCE IMAGING (ICG)  Patient Location: PACU  Anesthesia Type:General  Level of Consciousness: awake, alert  and oriented  Airway & Oxygen Therapy: Patient Spontanous Breathing  Post-op Assessment: Report given to RN and Post -op Vital signs reviewed and stable  Post vital signs: Reviewed and stable  Last Vitals:  Vitals Value Taken Time  BP 146/82 06/28/19 1131  Temp    Pulse 72 06/28/19 1132  Resp 16 06/28/19 1132  SpO2 92 % 06/28/19 1132  Vitals shown include unvalidated device data.  Last Pain:  Vitals:   06/28/19 0821  TempSrc: Temporal  PainSc: 0-No pain         Complications: No apparent anesthesia complications

## 2019-06-28 NOTE — Anesthesia Postprocedure Evaluation (Signed)
Anesthesia Post Note  Patient: Scotland Exley  Procedure(s) Performed: XI ROBOTIC ASSISTED LAPAROSCOPIC CHOLECYSTECTOMY (N/A Abdomen) INDOCYANINE GREEN FLUORESCENCE IMAGING (ICG)  Patient location during evaluation: PACU Anesthesia Type: General Level of consciousness: awake and alert Pain management: pain level controlled Vital Signs Assessment: post-procedure vital signs reviewed and stable Respiratory status: spontaneous breathing, nonlabored ventilation, respiratory function stable and patient connected to nasal cannula oxygen Cardiovascular status: blood pressure returned to baseline and stable Postop Assessment: no apparent nausea or vomiting Anesthetic complications: no     Last Vitals:  Vitals:   06/28/19 1255 06/28/19 1315  BP: 139/74 (!) 151/65  Pulse: (!) 53 68  Resp: (!) 21 14  Temp: 37.3 C (!) 36.1 C  SpO2: 99% 95%    Last Pain:  Vitals:   06/28/19 1315  TempSrc: Temporal  PainSc: 2                  Precious Haws Etha Stambaugh

## 2019-06-28 NOTE — Interval H&P Note (Signed)
History and Physical Interval Note:  06/28/2019 9:39 AM  Rhonda Brock  has presented today for surgery, with the diagnosis of [id number] Biliary colic.  The various methods of treatment have been discussed with the patient and family. After consideration of risks, benefits and other options for treatment, the patient has consented to  Procedure(s): XI ROBOTIC [place] (N/A) as a surgical intervention.  The patient's history has been reviewed, patient examined, no change in status, stable for surgery.  I have reviewed the patient's chart and labs.  Questions were answered to the patient's satisfaction.     Caia Lofaro Lysle Pearl

## 2019-06-28 NOTE — Anesthesia Procedure Notes (Signed)
Procedure Name: Intubation Performed by: Nikholas Geffre L, CRNA Pre-anesthesia Checklist: Patient identified, Patient being monitored, Timeout performed, Emergency Drugs available and Suction available Patient Re-evaluated:Patient Re-evaluated prior to induction Oxygen Delivery Method: Circle system utilized Preoxygenation: Pre-oxygenation with 100% oxygen Induction Type: IV induction Ventilation: Mask ventilation without difficulty Laryngoscope Size: Mac and 3 Grade View: Grade I Tube type: Oral Tube size: 7.0 mm Number of attempts: 1 Airway Equipment and Method: Stylet Placement Confirmation: ETT inserted through vocal cords under direct vision,  positive ETCO2 and breath sounds checked- equal and bilateral Secured at: 21 cm Tube secured with: Tape Dental Injury: Teeth and Oropharynx as per pre-operative assessment        

## 2019-06-28 NOTE — Op Note (Signed)
Preoperative diagnosis:  chronic and cholecystitis  Postoperative diagnosis: same as above  Procedure: Robotic assisted Laparoscopic Cholecystectomy.   Anesthesia: GETA   Surgeon: Benjamine Sprague  Specimen: Gallbladder  Complications: None  EBL: 58mL  Wound Classification: Clean Contaminated  Indications: see HPI  Findings: Critical view of safety noted Cystic duct and artery identified, ligated and divided, clips remained intact at end of procedure Adequate hemostasis  Description of procedure:  The patient was placed on the operating table in the supine position. SCDs placed, pre-op abx administered.  General anesthesia was induced and OG tube placed by anesthesia. A time-out was completed verifying correct patient, procedure, site, positioning, and implant(s) and/or special equipment prior to beginning this procedure. The abdomen was prepped and draped in the usual sterile fashion.    Veress needle was placed at the Palmer's point and insufflation was started after confirming a positive saline drop test and no immediate increase in abdominal pressure.  After reaching 15 mm, the Veress needle was removed and a 8 mm port was placed via optiview technique under umbilicus measured 0000000 from gallbladder.  The abdomen was inspected and no abnormalities or injuries were found.  Under direct vision, ports were placed in the following locations: One 12 mm patient left of the umbilicus, 8cm from the optiviewed port, one 8 mm port placed to the patient right of the umbilical port 8 cm apart.  1 additional 8 mm port placed lateral to the 78mm port.  Once ports were placed, The table was placed in the reverse Trendelenburg position with the right side up. The Xi platform was brought into the operative field and docked to the ports successfully.  An endoscope was placed through the umbilical port, fenestrated grasper through the adjacent patient right port, prograsp to the far patient left port, and  then a hook cautery in the left port.  The dome of the gallbladder was grasped with prograsp, passed and retracted over the dome of the liver. Adhesions between the gallbladder and omentum, duodenum and transverse colon were lysed via hook cautery. The infundibulum was grasped with the fenestrated grasper and retracted toward the right lower quadrant. This maneuver exposed Calot's triangle. The peritoneum overlying the gallbladder infundibulum was then dissected using combination of Maryland dissector and electrocautery hook and the cystic duct and cystic artery identified.  Critical view of safety with the liver bed clearly visible behind the duct and artery with no additional structures noted.  The cystic duct and cystic artery clipped and divided close to the gallbladder.  1 robotic clip was placed at the base of the cystic duct, one clip for the cystic artery.   The gallbladder was then dissected from its peritoneal and liver bed attachments by electrocautery. Hemostasis was checked prior to removing the hook cautery and the Endo Catch bag was then placed through the 12 mm port and the gallbladder was removed.  The gallbladder was passed off the table as a specimen. There was no evidence of bleeding from the gallbladder fossa or cystic artery or leakage of the bile from the cystic duct stump. The 12 mm port site closed with PMI using 0 vicryl under direct vision.  Abdomen desufflated and secondary trocars were removed under direct vision. No bleeding was noted. All skin incisions then closed with subcuticular sutures of 4-0 monocryl and dressed with topical skin adhesive. The orogastric tube was removed and patient extubated.  The patient tolerated the procedure well and was taken to the postanesthesia care unit in  stable condition.  All sponge and instrument count correct at end of procedure.

## 2019-06-28 NOTE — Discharge Instructions (Addendum)
Laparoscopic Cholecystectomy, Care After This sheet gives you information about how to care for yourself after your procedure. Your doctor may also give you more specific instructions. If you have problems or questions, contact your doctor. Follow these instructions at home: Care for cuts from surgery (incisions)   Follow instructions from your doctor about how to take care of your cuts from surgery. Make sure you: ? Wash your hands with soap and water before you change your bandage (dressing). If you cannot use soap and water, use hand sanitizer. ? Change your bandage as told by your doctor. ? Leave stitches (sutures), skin glue, or skin tape (adhesive) strips in place. They may need to stay in place for 2 weeks or longer. If tape strips get loose and curl up, you may trim the loose edges. Do not remove tape strips completely unless your doctor says it is okay.  Do not take baths, swim, or use a hot tub until your doctor says it is okay. OK TO SHOWER 24HRS AFTER YOUR SURGERY.   Check your surgical cut area every day for signs of infection. Check for: ? More redness, swelling, or pain. ? More fluid or blood. ? Warmth. ? Pus or a bad smell. Activity  Do not drive or use heavy machinery while taking prescription pain medicine.  Do not play contact sports until your doctor says it is okay.  Do not drive for 24 hours if you were given a medicine to help you relax (sedative).  Rest as needed. Do not return to work or school until your doctor says it is okay. General instructions .  tylenol and advil as needed for discomfort.  Please alternate between the two every four hours as needed for pain.   .  Use narcotics, if prescribed, only when tylenol and motrin is not enough to control pain. .  325-650mg every 8hrs to max of 3000mg/24hrs (including the 325mg in every norco dose) for the tylenol.   .  Advil up to 800mg per dose every 8hrs as needed for pain.    To prevent or treat constipation  while you are taking prescription pain medicine, your doctor may recommend that you: ? Drink enough fluid to keep your pee (urine) clear or pale yellow. ? Take over-the-counter or prescription medicines. ? Eat foods that are high in fiber, such as fresh fruits and vegetables, whole grains, and beans. ? Limit foods that are high in fat and processed sugars, such as fried and sweet foods. Contact a doctor if:  You develop a rash.  You have more redness, swelling, or pain around your surgical cuts.  You have more fluid or blood coming from your surgical cuts.  Your surgical cuts feel warm to the touch.  You have pus or a bad smell coming from your surgical cuts.  You have a fever.  One or more of your surgical cuts breaks open. Get help right away if:  You have trouble breathing.  You have chest pain.  You have pain that is getting worse in your shoulders.  You faint or feel dizzy when you stand.  You have very bad pain in your belly (abdomen).  You are sick to your stomach (nauseous) for more than one day.  You have throwing up (vomiting) that lasts for more than one day.  You have leg pain. This information is not intended to replace advice given to you by your health care provider. Make sure you discuss any questions you have with your   health care provider. Document Released: 12/22/2007 Document Revised: 10/03/2015 Document Reviewed: 08/31/2015 Elsevier Interactive Patient Education  2019 [employer]   AMBULATORY SURGERY  DISCHARGE INSTRUCTIONS   1) The drugs that you were given will stay in your system until tomorrow so for the next 24 hours you should not:  A) Drive an automobile B) Make any legal decisions C) Drink any alcoholic beverage   2) You may resume regular meals tomorrow.  Today it is better to start with liquids and gradually work up to solid foods.  You may eat anything you prefer, but it is better to start with liquids, then soup and crackers,  and gradually work up to solid foods.   3) Please notify your doctor immediately if you have any unusual bleeding, trouble breathing, redness and pain at the surgery site, drainage, fever, or pain not relieved by medication.    4) Additional Instructions:        Please contact your physician with any problems or Same Day Surgery at [phone], Monday through Friday 6 am to 4 pm, or [place] [facility] [place] Main number at [phone]. 

## 2019-07-01 ENCOUNTER — Encounter: Payer: Self-pay | Admitting: Internal Medicine

## 2019-07-01 DIAGNOSIS — Z9049 Acquired absence of other specified parts of digestive tract: Secondary | ICD-10-CM | POA: Insufficient documentation

## 2019-07-01 LAB — SURGICAL PATHOLOGY

## 2019-10-10 ENCOUNTER — Other Ambulatory Visit: Payer: Self-pay

## 2019-10-10 ENCOUNTER — Inpatient Hospital Stay: Payer: 59 | Attending: Oncology

## 2019-10-10 LAB — CBC WITH DIFFERENTIAL/PLATELET
Abs Immature Granulocytes: 0.08 10*3/uL — ABNORMAL HIGH (ref 0.00–0.07)
Basophils Absolute: 0.1 10*3/uL (ref 0.0–0.1)
Basophils Relative: 1 %
Eosinophils Absolute: 0.3 10*3/uL (ref 0.0–0.5)
Eosinophils Relative: 3 %
HCT: 40.6 % (ref 36.0–46.0)
Hemoglobin: 14.4 g/dL (ref 12.0–15.0)
Immature Granulocytes: 1 %
Lymphocytes Relative: 27 %
Lymphs Abs: 2.8 10*3/uL (ref 0.7–4.0)
MCH: 32.8 pg (ref 26.0–34.0)
MCHC: 35.5 g/dL (ref 30.0–36.0)
MCV: 92.5 fL (ref 80.0–100.0)
Monocytes Absolute: 0.7 10*3/uL (ref 0.1–1.0)
Monocytes Relative: 7 %
Neutro Abs: 6.3 10*3/uL (ref 1.7–7.7)
Neutrophils Relative %: 61 %
Platelets: 293 10*3/uL (ref 150–400)
RBC: 4.39 MIL/uL (ref 3.87–5.11)
RDW: 12.5 % (ref 11.5–15.5)
WBC: 10.3 10*3/uL (ref 4.0–10.5)
nRBC: 0 % (ref 0.0–0.2)

## 2019-10-10 LAB — IRON AND TIBC
Iron: 139 ug/dL (ref 28–170)
Saturation Ratios: 52 % — ABNORMAL HIGH (ref 10.4–31.8)
TIBC: 267 ug/dL (ref 250–450)
UIBC: 128 ug/dL

## 2019-10-10 LAB — HEPATIC FUNCTION PANEL
ALT: 19 U/L (ref 0–44)
AST: 23 U/L (ref 15–41)
Albumin: 4 g/dL (ref 3.5–5.0)
Alkaline Phosphatase: 62 U/L (ref 38–126)
Bilirubin, Direct: 0.1 mg/dL (ref 0.0–0.2)
Indirect Bilirubin: 0.8 mg/dL (ref 0.3–0.9)
Total Bilirubin: 0.9 mg/dL (ref 0.3–1.2)
Total Protein: 6.9 g/dL (ref 6.5–8.1)

## 2019-10-10 LAB — FERRITIN: Ferritin: 60 ng/mL (ref 11–307)

## 2019-10-11 ENCOUNTER — Encounter: Payer: Self-pay | Admitting: Oncology

## 2019-10-11 ENCOUNTER — Inpatient Hospital Stay: Payer: 59

## 2019-10-11 ENCOUNTER — Inpatient Hospital Stay (HOSPITAL_BASED_OUTPATIENT_CLINIC_OR_DEPARTMENT_OTHER): Payer: 59 | Admitting: Oncology

## 2019-10-11 NOTE — Progress Notes (Signed)
Hematology/Oncology follow up note [facility] [phone] [phone]   Patient Care Team: Crecencio Mc, MD as PCP - General (Internal Medicine) Crecencio Mc, MD (Internal Medicine) Bary Castilla Forest Gleason, MD (General Surgery)  REFERRING PROVIDER: Crecencio Mc, MD  CHIEF COMPLAINTS/REASON FOR VISIT:  Patient presented to follow-up for management of hemochromatosis and iron overload.  HISTORY OF PRESENTING ILLNESS:  Rhonda Brock is a  64 y.o.  female with PMH listed below who was presents for follow up of hereditary hemochromatosis, homozygous C282Y mutation. #   She has followed up with gastroenterologist and has received hepatitis vaccination.  INTERVAL HISTORY Rhonda Brock is a 64 y.o. female who has above history reviewed by me today presents for follow up visit for management of hereditary hemochromatosis with iron overload. Problems and complaints are listed below: She has no new complaints. Denies joint pain, fatigue, sob . Review of Systems  Constitutional: Negative for appetite change, chills, fatigue and fever.  HENT:   Negative for hearing loss and voice change.   Eyes: Negative for eye problems.  Respiratory: Negative for chest tightness and cough.   Cardiovascular: Negative for chest pain.  Gastrointestinal: Negative for abdominal distention, abdominal pain and blood in stool.  Endocrine: Negative for hot flashes.  Genitourinary: Negative for difficulty urinating and frequency.   Musculoskeletal: Negative for arthralgias.  Skin: Negative for itching and rash.  Neurological: Negative for extremity weakness.  Hematological: Negative for adenopathy.  Psychiatric/Behavioral: Negative for confusion.    MEDICAL HISTORY:  Past Medical History:  Diagnosis Date   Anxiety    Back pain    Chronic headaches    Cyst (solitary) of breast    bilaterally   GERD (gastroesophageal reflux disease)     Hemochromatosis, hereditary ([place]) 03/22/2018   Hypertension     SURGICAL HISTORY: Past Surgical History:  Procedure Laterality Date   ABDOMINAL HYSTERECTOMY  2001   APPENDECTOMY  1962   breast cysts     BREAST SURGERY  2004, 2005   left breast cyst drained x 2   CHOLECYSTECTOMY  06/2019   COLONOSCOPY  06/08/2006   COLONOSCOPY WITH PROPOFOL N/A 06/29/2016   Procedure: COLONOSCOPY WITH PROPOFOL;  Surgeon: Robert Bellow, MD;  Location: [facility] ENDOSCOPY;  Service: Endoscopy;  Laterality: N/A;   TONSILLECTOMY  1970    SOCIAL HISTORY: Social History   Socioeconomic History   Marital status: Married    Spouse name: Not on file   Number of children: Not on file   Years of education: 14   Highest education level: Not on file  Occupational History   Occupation: Homemaker  Tobacco Use   Smoking status: Never Smoker   Smokeless tobacco: Never Used  Scientific laboratory technician Use: Never used  Substance and Sexual Activity   Alcohol use: No   Drug use: No   Sexual activity: Not on file  Other Topics Concern   Not on file  Social History Narrative   Regular exercise-yes   Caffeine use-yes         Social Determinants of Health   Financial Resource Strain:    Difficulty of Paying Living Expenses:   Food Insecurity:    Worried About Charity fundraiser in the Last Year:    Arboriculturist in the Last Year:   Transportation Needs:    Film/video editor (Medical):    Lack of Transportation (Non-Medical):   Physical Activity:    Days of Exercise  per Week:    Minutes of Exercise per Session:   Stress:    Feeling of Stress :   Social Connections:    Frequency of Communication with Friends and Family:    Frequency of Social Gatherings with Friends and Family:    Attends Religious Services:    Active Member of Clubs or Organizations:    Attends Music therapist:    Marital Status:   Intimate Partner Violence:    Fear of Current  or Ex-Partner:    Emotionally Abused:    Physically Abused:    Sexually Abused:     FAMILY HISTORY: Family History  Problem Relation Age of Onset   Cancer Mother 32       Breast Cancer-Mastectomy, Also Abdominal cancer   Asthma Father    Diabetes Father     ALLERGIES:  has no active allergies.  MEDICATIONS:  Current Outpatient Medications  Medication Sig Dispense Refill   calcium carbonate (TUMS - DOSED IN MG ELEMENTAL CALCIUM) 500 MG chewable tablet Chew 1 tablet by mouth daily.     hydrochlorothiazide (HYDRODIURIL) 25 MG tablet TAKE 1 TABLET BY MOUTH EVERY DAY 90 tablet 1   HYDROcodone-acetaminophen (NORCO/VICODIN) 5-325 MG tablet Take 1-2 tablets by mouth every 6 (six) hours as needed for moderate pain or severe pain. (Patient not taking: Reported on 06/24/2019) 15 tablet 0   ibuprofen (ADVIL) 800 MG tablet Take 1 tablet (800 mg total) by mouth every 8 (eight) hours as needed for mild pain or moderate pain. 30 tablet 0   ondansetron (ZOFRAN ODT) 4 MG disintegrating tablet Take 1 tablet (4 mg total) by mouth every 8 (eight) hours as needed for nausea or vomiting. (Patient not taking: Reported on 06/24/2019) 20 tablet 0   No current facility-administered medications for this visit.     PHYSICAL EXAMINATION: ECOG PERFORMANCE STATUS: 0 - Asymptomatic Vitals:   10/11/19 1348  BP: 137/82  Pulse: 78  Resp: 18  Temp: (!) 97.1 F (36.2 C)   Filed Weights   10/11/19 1348  Weight: 202 lb 3.2 oz (91.7 kg)    Physical Exam Constitutional:      General: She is not in acute distress. HENT:     Head: Normocephalic and atraumatic.  Eyes:     General: No scleral icterus.    Pupils: Pupils are equal, round, and reactive to light.  Cardiovascular:     Rate and Rhythm: Normal rate and regular rhythm.     Heart sounds: Normal heart sounds.  Pulmonary:     Effort: Pulmonary effort is normal. No respiratory distress.     Breath sounds: No wheezing.  Abdominal:      General: Bowel sounds are normal. There is no distension.     Palpations: Abdomen is soft. There is no mass.     Tenderness: There is no abdominal tenderness.  Musculoskeletal:        General: No deformity. Normal range of motion.     Cervical back: Normal range of motion and neck supple.  Skin:    General: Skin is warm and dry.     Findings: No erythema or rash.  Neurological:     Mental Status: She is alert and oriented to person, place, and time. Mental status is at baseline.     Cranial Nerves: No cranial nerve deficit.     Coordination: Coordination normal.  Psychiatric:        Mood and Affect: Mood normal.      LABORATORY  DATA:  I have reviewed the data as listed Lab Results  Component Value Date   WBC 10.3 10/10/2019   HGB 14.4 10/10/2019   HCT 40.6 10/10/2019   MCV 92.5 10/10/2019   PLT 293 10/10/2019   Recent Labs    04/03/19 1304 06/09/19 0438 06/26/19 0833 10/10/19 1352  NA  --  137 142  --   K  --  3.3* 3.5  --   CL  --  101 104  --   CO2  --  24 29  --   GLUCOSE  --  139* 120*  --   BUN  --  18 17  --   CREATININE  --  0.88 0.98  --   CALCIUM  --  9.5 9.6  --   GFRNONAA  --  >60 >60  --   GFRAA  --  >60 >60  --   PROT 7.0 7.3  --  6.9  ALBUMIN 4.1 4.1  --  4.0  AST 23 24  --  23  ALT 22 20  --  19  ALKPHOS 63 65  --  62  BILITOT 0.9 1.0  --  0.9  BILIDIR 0.1 <0.1  --  0.1  IBILI 0.8 NOT CALCULATED  --  0.8   Iron/TIBC/Ferritin/ %Sat    Component Value Date/Time   IRON 139 10/10/2019 1352   IRON 130 12/16/2011 1459   TIBC 267 10/10/2019 1352   TIBC 226 (L) 12/16/2011 1459   FERRITIN 60 10/10/2019 1352   FERRITIN 367 12/16/2011 1459   IRONPCTSAT 52 (H) 10/10/2019 1352   IRONPCTSAT 66 (H) 02/19/2018 0830     RADIOGRAPHIC STUDIES: I have personally reviewed the radiological images as listed and agreed with the findings in the report.  10/06/2017 Mammogram Negative, recommend annual screening mammogram.   ASSESSMENT & PLAN:  1.  Hemochromatosis, hereditary ([place])   Homozygous hemochromatosis mutation. Labs are reviewed and discussed with patient. Ferritin is 50, iron saturation is 52% Proceed with phlebotomy 300cc today.  Continue follow up every 6 months  All questions were answered. The patient knows to call the clinic with any problems questions or concerns.  Return of visit: She will have blood work done and follow-up in 6 months for possible phlebotomy.   Earlie Server, MD, PhD Hematology Oncology [facility] at [facility] Pager- [phone] 10/11/2019

## 2019-10-11 NOTE — Progress Notes (Signed)
300 cc of blood removed via therapeutic phlebotomy. Pt tolerated well, VSS at d/c

## 2019-11-12 ENCOUNTER — Ambulatory Visit: Payer: 59 | Admitting: Gastroenterology

## 2019-11-27 ENCOUNTER — Other Ambulatory Visit: Payer: Self-pay | Admitting: Internal Medicine

## 2020-01-03 ENCOUNTER — Ambulatory Visit (INDEPENDENT_AMBULATORY_CARE_PROVIDER_SITE_OTHER): Payer: Managed Care, Other (non HMO)

## 2020-01-03 ENCOUNTER — Other Ambulatory Visit: Payer: Self-pay

## 2020-01-03 DIAGNOSIS — Z23 Encounter for immunization: Secondary | ICD-10-CM

## 2020-01-23 LAB — HM MAMMOGRAPHY

## 2020-01-30 ENCOUNTER — Ambulatory Visit: Payer: 59 | Admitting: Gastroenterology

## 2020-02-23 IMAGING — US US ABDOMEN LIMITED
1 series · 14 of 25 positions shown · non-contrast
Comparison: None.

CLINICAL DATA: Iron overload.

EXAM:
ULTRASOUND ABDOMEN LIMITED RIGHT UPPER QUADRANT

[Series 1: us abdomen limited · 14 of 50 slices shown]
[im 1/50]
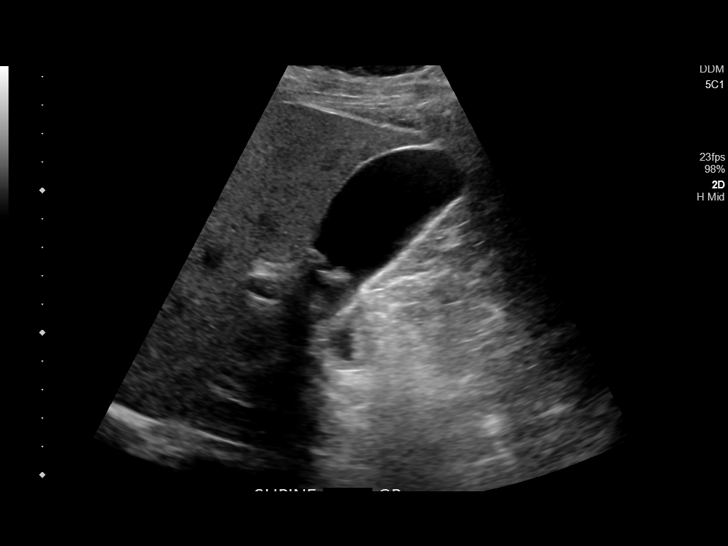
[im 5/50]
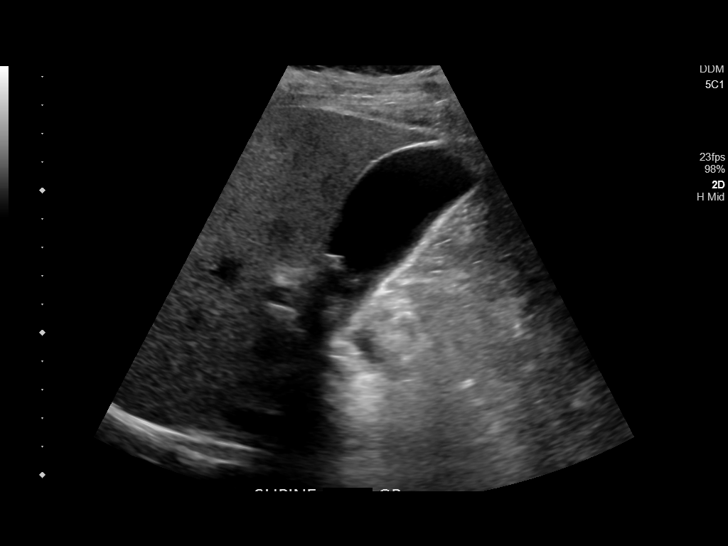
[im 9/50]
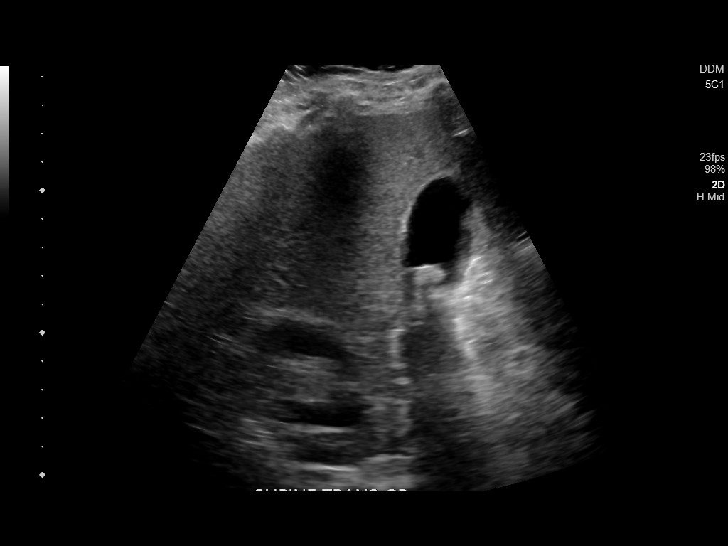
[im 13/50]
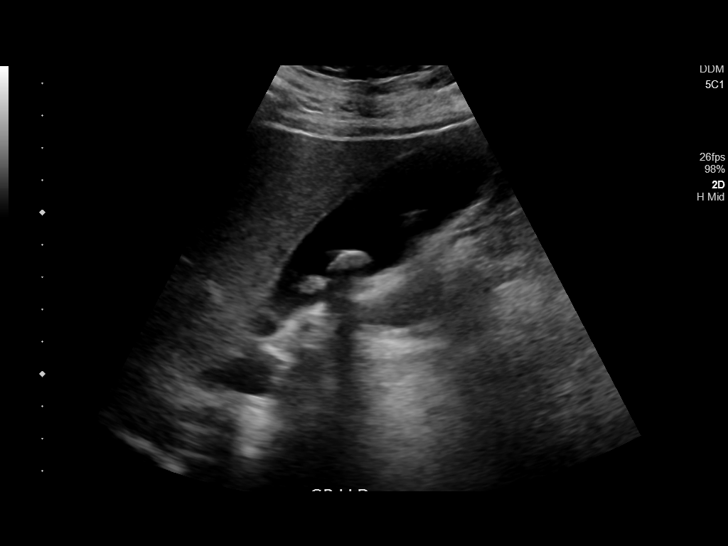
[im 17/50]
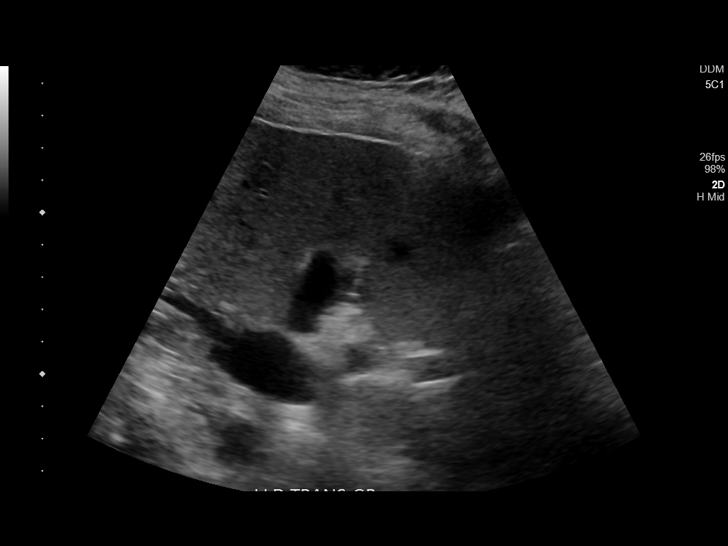
[im 19/50]
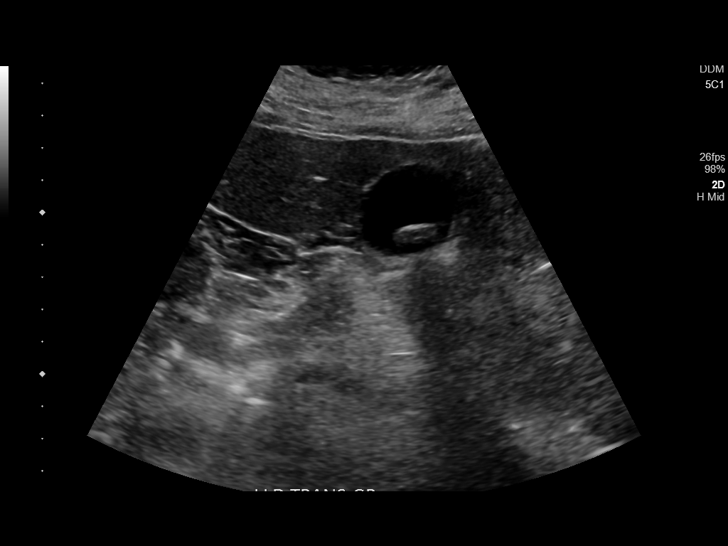
[im 23/50]
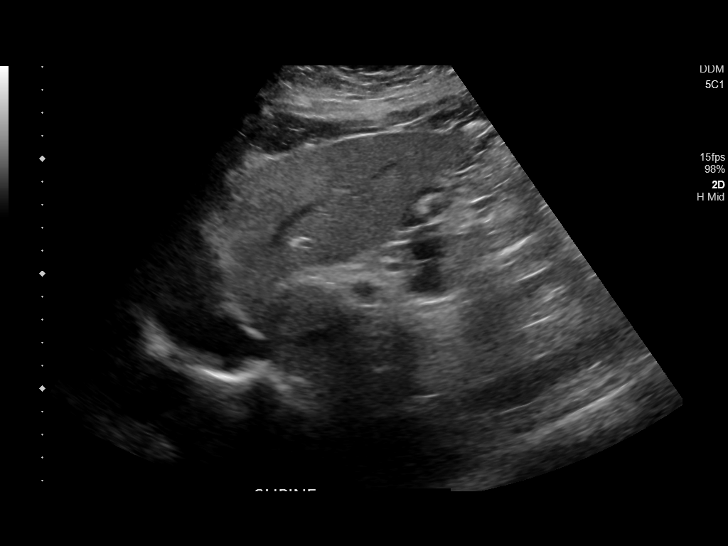
[im 27/50]
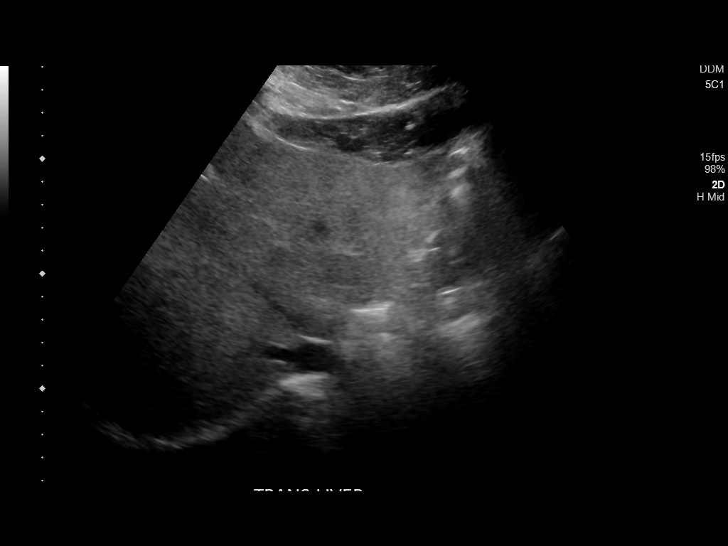
[im 31/50]
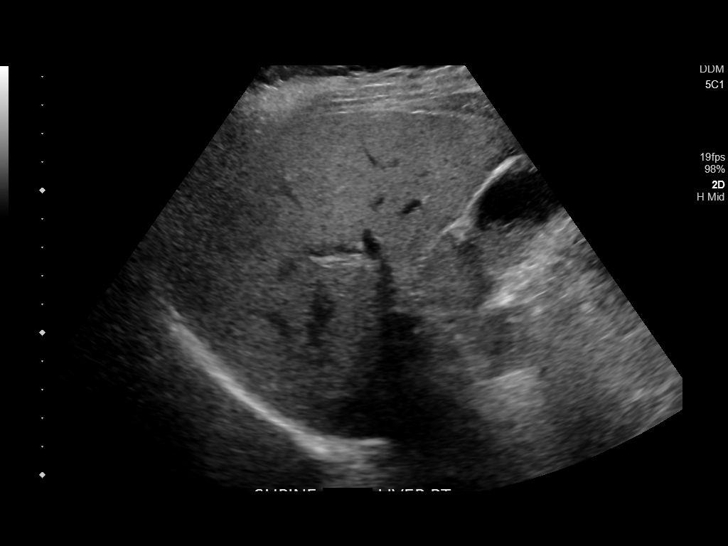
[im 33/50]
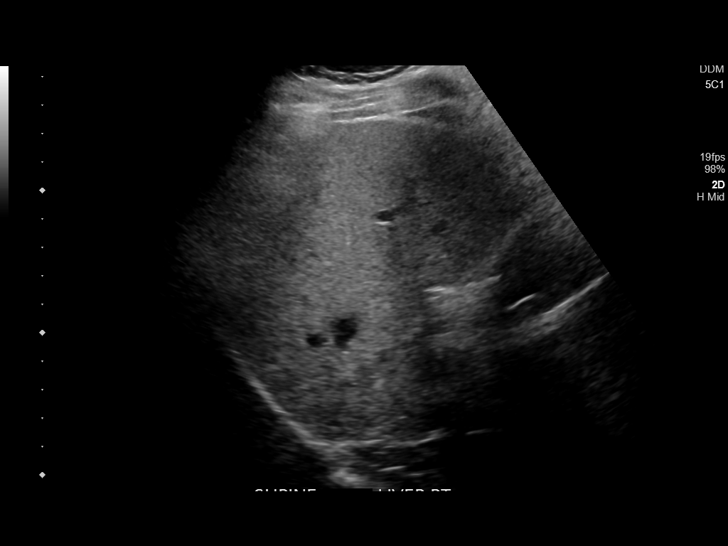
[im 37/50]
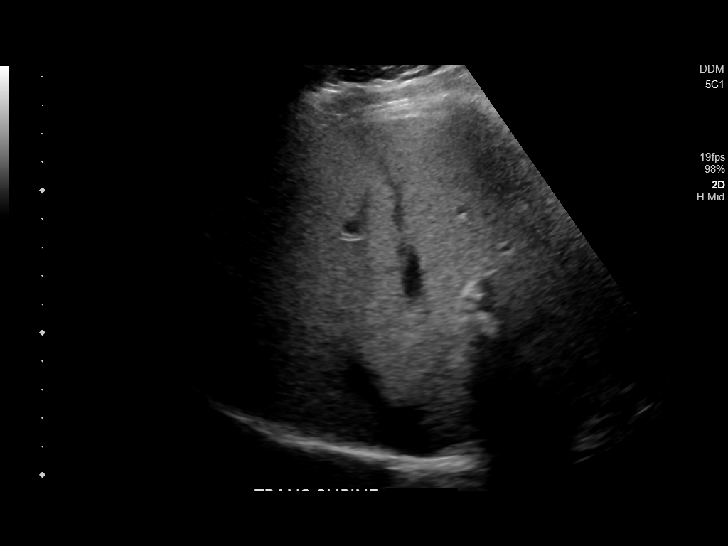
[im 41/50]
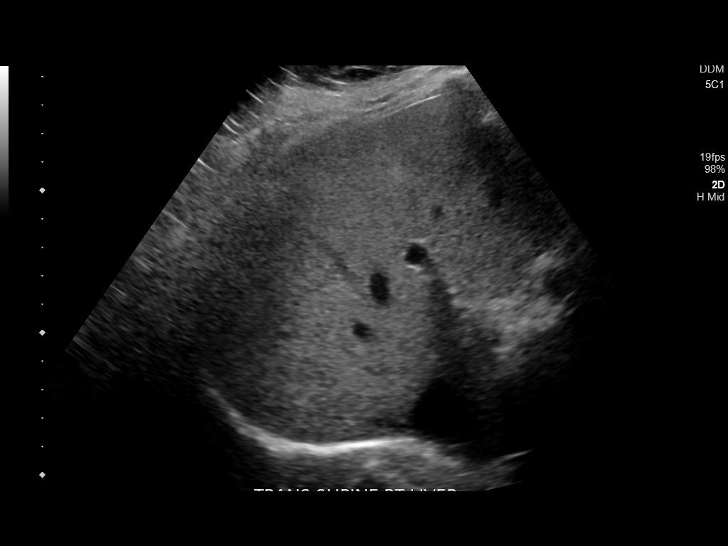
[im 45/50]
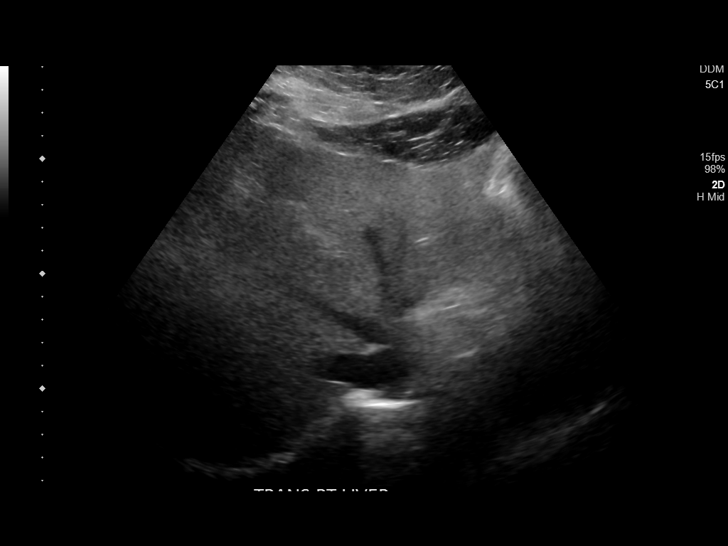
[im 50/50]
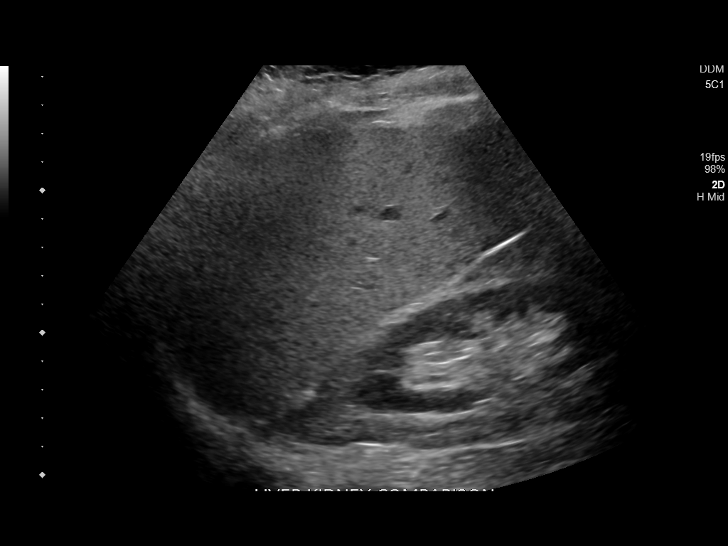

[14 of 25 positions shown; findings below may reference images not displayed]

FINDINGS: Gallbladder:

Multiple gallstones measuring up to 1.2 cm in diameter each. No
gallbladder wall thickening or pericholecystic fluid. No sonographic
Murphy sign.

Common bile duct:

Diameter: 4.1 mm

Liver:

Diffusely echogenic. No mass seen. Portal vein is patent on color
Doppler imaging with normal direction of blood flow towards the
liver.
IMPRESSION: 1. Diffusely echogenic liver. This can be seen with a variety of
conditions including steatosis and hemochromatosis.
2. Otherwise, normal examination.

## 2020-03-16 ENCOUNTER — Ambulatory Visit (INDEPENDENT_AMBULATORY_CARE_PROVIDER_SITE_OTHER): Payer: 59 | Admitting: Gastroenterology

## 2020-03-16 ENCOUNTER — Encounter: Payer: Self-pay | Admitting: Gastroenterology

## 2020-03-16 NOTE — Progress Notes (Signed)
[name] MD, MRCP(U.K) [address]  [place]  [place], [place] [address]  Main: [phone]  Fax: [phone]   Primary Care Physician: Crecencio Mc, MD  Primary Gastroenterologist:  Dr. [name]   Chief Complaint  Patient presents with  . Follow-up    anemia  Follow-up for hemochromatosis  HPI: Rhonda Brock is a 64 y.o. 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 02/2018 : RUQ USG shows diffusely echogenic liver.    No joint pains, no increased pigmentation of skin, does not eat red meat. Not much vitamin C in diet , no OTC iron supplements, Takes TUMS at night .  05/01/2018: Hep C ab, hep b c ab, Hep b e ab/ag, Hbsab/ag ,  -negative, Hep A ab positive. TSH-normal   05/04/2018: Liver elastography : F0/F1, risk of fibrosis minimal. DEXA scan -normal   11/06/2018: Ferritin 31, Hb 13.9   09/24/2018: LFT's normal  Interval history  11/20/2018-03/16/2020  10/10/2019-hepatic function panel normal.  Iron of 139 percentage saturation 52 and ferritin 60. Underwent phlebotomy with Dr. Tasia Catchings in July 2021. Doing well denies any complaints.  She completed all 3 doses of her hepatitis B shots.  Completed 3 vaccines for Covid    Current Outpatient Medications  Medication Sig Dispense Refill  . calcium carbonate (TUMS - DOSED IN MG ELEMENTAL CALCIUM) 500 MG chewable tablet Chew 1 tablet by mouth daily.    . hydrochlorothiazide (HYDRODIURIL) 25 MG tablet TAKE 1 TABLET BY MOUTH EVERY DAY 90 tablet 1  . HYDROcodone-acetaminophen (NORCO/VICODIN) 5-325 MG tablet Take 1-2 tablets by mouth every 6 (six) hours as needed for moderate pain or severe pain. (Patient not taking: Reported on 06/24/2019) 15 tablet 0  . ibuprofen (ADVIL) 800 MG tablet Take 1 tablet (800 mg total) by mouth every 8 (eight) hours as needed for mild pain or moderate pain. 30 tablet 0  . ondansetron  (ZOFRAN ODT) 4 MG disintegrating tablet Take 1 tablet (4 mg total) by mouth every 8 (eight) hours as needed for nausea or vomiting. (Patient not taking: Reported on 06/24/2019) 20 tablet 0   No current facility-administered medications for this visit.    Allergies as of 03/16/2020  . (No Known Allergies)    ROS:  General: Negative for anorexia, weight loss, fever, chills, fatigue, weakness. ENT: Negative for hoarseness, difficulty swallowing , nasal congestion. CV: Negative for chest pain, angina, palpitations, dyspnea on exertion, peripheral edema.  Respiratory: Negative for dyspnea at rest, dyspnea on exertion, cough, sputum, wheezing.  GI: See history of present illness. GU:  Negative for dysuria, hematuria, urinary incontinence, urinary frequency, nocturnal urination.  Endo: Negative for unusual weight change.    Physical Examination:   BP 127/83 (BP Location: Left Arm, Patient Position: Sitting, Cuff Size: Large)   Pulse 71   Ht 5\' 5"  (1.651 m)   Wt 192 lb 3.2 oz (87.2 kg)   BMI 31.98 kg/m   General: Well-nourished, well-developed in no acute distress.  Eyes: No icterus. Conjunctivae pink. Neuro: Alert and oriented x 3.  Grossly intact. Skin: Warm and dry, no jaundice.   Psych: Alert and cooperative, normal mood and affect. Thinning of hair over the anterior fontanelle  Imaging Studies: No results found.  Assessment and Plan:   Rhonda Brock is a 64 y.o. y/o female here to follow-up for homozygous C2H2Y hemochromatosis. Liver elastrography shows F0/F1 fibrosis and its  minimal . Doing well on phlebotomy .  Hepatitis A total antibody positive hepatitis B surface antibody was negative received vaccination  Plan  1.Since Ferritin <1000 and has normal LFT's continue Phelobotomy with Dr Tasia Catchings and no reason for a liver biopsy per AASLD guidelines 2011  2. She has no brother or sisters or kids for screening  4. Avoid/limit alcohol intake which she has been compliant with.   She also avoids red meat.,  5.Avoid multivitamins and Vitamin C supplements,Avoid uncooked sea food.  6. Check LFT's every 6 months .  Check hepatitis B surface antibody and TSH  Dr [name]  MD,MRCP [facility]) Follow up in 1 year

## 2020-03-17 ENCOUNTER — Encounter: Payer: Self-pay | Admitting: Gastroenterology

## 2020-03-17 LAB — TSH: TSH: 1.39 u[IU]/mL (ref 0.450–4.500)

## 2020-03-17 LAB — HEPATITIS B SURFACE ANTIBODY,QUALITATIVE: Hep B Surface Ab, Qual: REACTIVE

## 2020-04-15 ENCOUNTER — Telehealth: Payer: Self-pay | Admitting: Oncology

## 2020-04-15 ENCOUNTER — Encounter: Payer: Self-pay | Admitting: Oncology

## 2020-04-15 ENCOUNTER — Inpatient Hospital Stay: Payer: 59 | Attending: Oncology

## 2020-04-15 DIAGNOSIS — I10 Essential (primary) hypertension: Secondary | ICD-10-CM | POA: Insufficient documentation

## 2020-04-15 DIAGNOSIS — F418 Other specified anxiety disorders: Secondary | ICD-10-CM | POA: Diagnosis not present

## 2020-04-15 DIAGNOSIS — Z79899 Other long term (current) drug therapy: Secondary | ICD-10-CM | POA: Diagnosis not present

## 2020-04-15 LAB — HEPATIC FUNCTION PANEL
ALT: 21 U/L (ref 0–44)
AST: 26 U/L (ref 15–41)
Albumin: 4 g/dL (ref 3.5–5.0)
Alkaline Phosphatase: 62 U/L (ref 38–126)
Bilirubin, Direct: 0.1 mg/dL (ref 0.0–0.2)
Indirect Bilirubin: 0.9 mg/dL (ref 0.3–0.9)
Total Bilirubin: 1 mg/dL (ref 0.3–1.2)
Total Protein: 6.9 g/dL (ref 6.5–8.1)

## 2020-04-15 LAB — CBC WITH DIFFERENTIAL/PLATELET
Abs Immature Granulocytes: 0.06 10*3/uL (ref 0.00–0.07)
Basophils Absolute: 0.1 10*3/uL (ref 0.0–0.1)
Basophils Relative: 1 %
Eosinophils Absolute: 0.2 10*3/uL (ref 0.0–0.5)
Eosinophils Relative: 2 %
HCT: 40.2 % (ref 36.0–46.0)
Hemoglobin: 14.3 g/dL (ref 12.0–15.0)
Immature Granulocytes: 1 %
Lymphocytes Relative: 24 %
Lymphs Abs: 2.6 10*3/uL (ref 0.7–4.0)
MCH: 32.9 pg (ref 26.0–34.0)
MCHC: 35.6 g/dL (ref 30.0–36.0)
MCV: 92.6 fL (ref 80.0–100.0)
Monocytes Absolute: 0.7 10*3/uL (ref 0.1–1.0)
Monocytes Relative: 6 %
Neutro Abs: 7.1 10*3/uL (ref 1.7–7.7)
Neutrophils Relative %: 66 %
Platelets: 304 10*3/uL (ref 150–400)
RBC: 4.34 MIL/uL (ref 3.87–5.11)
RDW: 12.5 % (ref 11.5–15.5)
WBC: 10.8 10*3/uL — ABNORMAL HIGH (ref 4.0–10.5)
nRBC: 0 % (ref 0.0–0.2)

## 2020-04-15 LAB — IRON AND TIBC
Iron: 136 ug/dL (ref 28–170)
Saturation Ratios: 50 % — ABNORMAL HIGH (ref 10.4–31.8)
TIBC: 273 ug/dL (ref 250–450)
UIBC: 137 ug/dL

## 2020-04-15 LAB — FERRITIN: Ferritin: 60 ng/mL (ref 11–307)

## 2020-04-15 NOTE — Telephone Encounter (Signed)
Done..  Pt call was returned. Pt appts 04/17/20 are still sched

## 2020-04-15 NOTE — Telephone Encounter (Signed)
Pt called in regards to appointment scheduled 1/21 pt return call at [phone] reschedule

## 2020-04-16 NOTE — Telephone Encounter (Signed)
Done

## 2020-04-16 NOTE — Telephone Encounter (Signed)
I have been communicating with patient via MyChart.  Dr. Lissa Merlin with patient being r/s out 3 months.  Please contact patient to schedule 3 months lab with MD/possible phlebotomy 2 days later.

## 2020-04-17 ENCOUNTER — Inpatient Hospital Stay: Payer: 59

## 2020-04-17 ENCOUNTER — Inpatient Hospital Stay: Payer: 59 | Admitting: Oncology

## 2020-05-14 DIAGNOSIS — L821 Other seborrheic keratosis: Secondary | ICD-10-CM | POA: Diagnosis not present

## 2020-05-14 DIAGNOSIS — D225 Melanocytic nevi of trunk: Secondary | ICD-10-CM | POA: Diagnosis not present

## 2020-05-14 DIAGNOSIS — D2262 Melanocytic nevi of left upper limb, including shoulder: Secondary | ICD-10-CM | POA: Diagnosis not present

## 2020-05-14 DIAGNOSIS — D2272 Melanocytic nevi of left lower limb, including hip: Secondary | ICD-10-CM | POA: Diagnosis not present

## 2020-05-14 DIAGNOSIS — D2271 Melanocytic nevi of right lower limb, including hip: Secondary | ICD-10-CM | POA: Diagnosis not present

## 2020-05-14 DIAGNOSIS — D2261 Melanocytic nevi of right upper limb, including shoulder: Secondary | ICD-10-CM | POA: Diagnosis not present

## 2020-05-14 DIAGNOSIS — L661 Lichen planopilaris: Secondary | ICD-10-CM | POA: Diagnosis not present

## 2020-06-06 ENCOUNTER — Other Ambulatory Visit: Payer: Self-pay | Admitting: Internal Medicine

## 2020-06-06 DIAGNOSIS — I10 Essential (primary) hypertension: Secondary | ICD-10-CM

## 2020-06-06 DIAGNOSIS — E669 Obesity, unspecified: Secondary | ICD-10-CM

## 2020-06-08 ENCOUNTER — Other Ambulatory Visit: Payer: Self-pay | Admitting: Internal Medicine

## 2020-06-11 ENCOUNTER — Other Ambulatory Visit (INDEPENDENT_AMBULATORY_CARE_PROVIDER_SITE_OTHER): Payer: Medicare Other

## 2020-06-11 ENCOUNTER — Other Ambulatory Visit: Payer: Self-pay

## 2020-06-11 DIAGNOSIS — I10 Essential (primary) hypertension: Secondary | ICD-10-CM | POA: Diagnosis not present

## 2020-06-11 LAB — COMPREHENSIVE METABOLIC PANEL
ALT: 17 U/L (ref 0–35)
AST: 20 U/L (ref 0–37)
Albumin: 3.9 g/dL (ref 3.5–5.2)
Alkaline Phosphatase: 59 U/L (ref 39–117)
BUN: 19 mg/dL (ref 6–23)
CO2: 31 mEq/L (ref 19–32)
Calcium: 9.3 mg/dL (ref 8.4–10.5)
Chloride: 102 mEq/L (ref 96–112)
Creatinine, Ser: 0.85 mg/dL (ref 0.40–1.20)
GFR: 72.07 mL/min (ref >60.00)
Glucose, Bld: 96 mg/dL (ref 70–99)
Potassium: 3.9 mEq/L (ref 3.5–5.1)
Sodium: 139 mEq/L (ref 135–145)
Total Bilirubin: 0.8 mg/dL (ref 0.2–1.2)
Total Protein: 6.2 g/dL (ref 6.0–8.3)

## 2020-06-11 LAB — LIPID PANEL
Cholesterol: 174 mg/dL (ref 0–200)
HDL: 56.6 mg/dL (ref >39.00)
LDL Cholesterol: 93 mg/dL (ref 0–99)
NonHDL: 117.71
Total CHOL/HDL Ratio: 3
Triglycerides: 125 mg/dL (ref 0.0–149.0)
VLDL: 25 mg/dL (ref 0.0–40.0)

## 2020-06-11 LAB — HEMOGLOBIN A1C: Hgb A1c MFr Bld: 5.6 % (ref 4.6–6.5)

## 2020-06-17 ENCOUNTER — Ambulatory Visit (INDEPENDENT_AMBULATORY_CARE_PROVIDER_SITE_OTHER): Payer: Medicare Other | Admitting: Internal Medicine

## 2020-06-17 ENCOUNTER — Other Ambulatory Visit: Payer: Self-pay

## 2020-06-17 ENCOUNTER — Encounter: Payer: Self-pay | Admitting: Internal Medicine

## 2020-06-17 VITALS — BP 138/90 | HR 78 | Temp 98.2°F | Resp 15 | Ht 65.0 in | Wt 196.8 lb

## 2020-06-17 DIAGNOSIS — Z90710 Acquired absence of both cervix and uterus: Secondary | ICD-10-CM

## 2020-06-17 DIAGNOSIS — I10 Essential (primary) hypertension: Secondary | ICD-10-CM | POA: Diagnosis not present

## 2020-06-17 DIAGNOSIS — Z0001 Encounter for general adult medical examination with abnormal findings: Secondary | ICD-10-CM

## 2020-06-17 DIAGNOSIS — E782 Mixed hyperlipidemia: Secondary | ICD-10-CM | POA: Diagnosis not present

## 2020-06-17 DIAGNOSIS — Z0000 Encounter for general adult medical examination without abnormal findings: Secondary | ICD-10-CM

## 2020-06-17 DIAGNOSIS — E669 Obesity, unspecified: Secondary | ICD-10-CM

## 2020-06-17 MED ORDER — ZOSTER VAC RECOMB ADJUVANTED 50 MCG/0.5ML IM SUSR: 0.5000 mL | Freq: Once | INTRAMUSCULAR | 1 refills | Status: AC

## 2020-06-17 NOTE — Patient Instructions (Addendum)
Please send [place] a copy of your healthcare POA and Living Will  Supplements recommended :  Vit D 1000 IU s daily  Calcium 1200 mg daily through diet and supplements   DEXA due in 2025 since 2020 was normal density   Mammogram  EVERY YEAR October due .  I will order it at your next visit in September You are advised to get the Prevnar 20 pneumonia vaccine and the shingrx vaccines     LAST PAP of lifetime from GYN  Is Due      Please suspend ibuprofen for one week and continue to check your blood pressure daily for a week and send me the readings so I can determine if you need to start a medication to lower your blood pressure .   Health Maintenance After Age 40 After age 3, you are at a higher risk for certain long-term diseases and infections as well as injuries from falls. Falls are a major cause of broken bones and head injuries in people who are older than age 39. Getting regular preventive care can help to keep you healthy and well. Preventive care includes getting regular testing and making lifestyle changes as recommended by your health care provider. Talk with your health care provider about:  Which screenings and tests you should have. A screening is a test that checks for a disease when you have no symptoms.  A diet and exercise plan that is right for you. What should I know about screenings and tests to prevent falls? Screening and testing are the best ways to find a health problem early. Early diagnosis and treatment give you the best chance of managing medical conditions that are common after age 26. Certain conditions and lifestyle choices may make you more likely to have a fall. Your health care provider may recommend:  Regular vision checks. Poor vision and conditions such as cataracts can make you more likely to have a fall. If you wear glasses, make sure to get your prescription updated if your vision changes.  Medicine review. Work with your health care provider to  regularly review all of the medicines you are taking, including over-the-counter medicines. Ask your health care provider about any side effects that may make you more likely to have a fall. Tell your health care provider if any medicines that you take make you feel dizzy or sleepy.  Osteoporosis screening. Osteoporosis is a condition that causes the bones to get weaker. This can make the bones weak and cause them to break more easily.  Blood pressure screening. Blood pressure changes and medicines to control blood pressure can make you feel dizzy.  Strength and balance checks. Your health care provider may recommend certain tests to check your strength and balance while standing, walking, or changing positions.  Foot health exam. Foot pain and numbness, as well as not wearing proper footwear, can make you more likely to have a fall.  Depression screening. You may be more likely to have a fall if you have a fear of falling, feel emotionally low, or feel unable to do activities that you used to do.  Alcohol use screening. Using too much alcohol can affect your balance and may make you more likely to have a fall. What actions can I take to lower my risk of falls? General instructions  Talk with your health care provider about your risks for falling. Tell your health care provider if: ? You fall. Be sure to tell your health care provider about  all falls, even ones that seem minor. ? You feel dizzy, sleepy, or off-balance.  Take over-the-counter and prescription medicines only as told by your health care provider. These include any supplements.  Eat a healthy diet and maintain a healthy weight. A healthy diet includes low-fat dairy products, low-fat (lean) meats, and fiber from whole grains, beans, and lots of fruits and vegetables. Home safety  Remove any tripping hazards, such as rugs, cords, and clutter.  Install safety equipment such as grab bars in bathrooms and safety rails on  stairs.  Keep rooms and walkways well-lit. Activity  Follow a regular exercise program to stay fit. This will help you maintain your balance. Ask your health care provider what types of exercise are appropriate for you.  If you need a cane or walker, use it as recommended by your health care provider.  Wear supportive shoes that have nonskid soles.   Lifestyle  Do not drink alcohol if your health care provider tells you not to drink.  If you drink alcohol, limit how much you have: ? 0-1 drink a day for women. ? 0-2 drinks a day for men.  Be aware of how much alcohol is in your drink. In the [place] one drink equals one typical bottle of beer (12 oz), one-half glass of wine (5 oz), or one shot of hard liquor (1 oz).  Do not use any products that contain nicotine or tobacco, such as cigarettes and e-cigarettes. If you need help quitting, ask your health care provider. Summary  Having a healthy lifestyle and getting preventive care can help to protect your health and wellness after age 65.  Screening and testing are the best way to find a health problem early and help you avoid having a fall. Early diagnosis and treatment give you the best chance for managing medical conditions that are more common for people who are older than age 26.  Falls are a major cause of broken bones and head injuries in people who are older than age 59. Take precautions to prevent a fall at home.  Work with your health care provider to learn what changes you can make to improve your health and wellness and to prevent falls. This information is not intended to replace advice given to you by your health care provider. Make sure you discuss any questions you have with your health care provider. Document Revised: 07/05/2018 Document Reviewed: 01/25/2017 Elsevier Patient Education  2021 [employer].

## 2020-06-17 NOTE — Progress Notes (Addendum)
The patient is here for the Welcome to  Medicare  preventive visit .  She has not been seen since 2019 and management of chronic issues  was also done.     has a past medical history of Anxiety, Back pain, Chronic headaches, Cyst (solitary) of breast, GERD (gastroesophageal reflux disease), Hemochromatosis, hereditary ([place]) (03/22/2018), and Hypertension.    reports that she has never smoked. She has never used smokeless tobacco. She reports that she does not drink alcohol and does not use drugs.   The roster of all physicians providing medical care to patient : Patient Care Team: Crecencio Mc, MD as PCP - General (Internal Medicine) Crecencio Mc, MD (Internal Medicine) Bary Castilla Forest Gleason, MD (General Surgery) Earlie Server, MD as Consulting Physician (Hematology and Oncology)  Activities of daily living:  The patient is 100% independent in all ADLs: dressing, toileting, feeding as well as independent mobility Fall risk was assessed by direct patient evaluation of patient's balance, gait and ability to risk from a chair and from a kneeling position. Home safety : The patient has smoke detectors in the home. They wear seatbelts.  There are no firearms at home. There is no violence in the home.  Patient has seen their eye doctor in the last year.   Visual acuity was assessed today  and was 20/20 with correction lenses. Patient admits  to slight hearing difficulty with regard to whispered voices and some television programs.  They have deferred audiologic testing in the last year.   There is no risks for hepatitis, STDs or HIV. There is no   history of blood transfusion. They have no travel history to infectious disease endemic areas of the world.  The patient has seen their dentist in the last six month. T They do not  have excessive sun exposure. Discussed the need for sun protection: hats, long sleeves and use of sunscreen if there is significant sun exposure.   Diet: the importance of a  healthy diet is discussed. They do have a healthy diet but is not counting calories or trying to lose weight.   The benefits of regular aerobic exercise were discussed. Patient walks 7 times per week ,  a minimum of 15 to 30  minutes.   Depression screen:   Depression screen [facility] 2/9 06/17/2020 05/09/2016  Decreased Interest 0 0  Down, Depressed, Hopeless 0 0  PHQ - 2 Score 0 0       Cognitive assessment: the patient manages all their financial and personal affairs and is actively engaged. They could relate day,date,year and events; recalled 2/3 objects at 3 minutes; performed clock-face test normally.  The following portions of the patient's history were reviewed and updated as appropriate: allergies, current medications, past family history, past medical history,  past surgical history, past social history  and problem list.   During the course of the visit the patient was educated and counseled about appropriate screening and preventive services including : fall prevention , diabetes screening, nutrition counseling, colorectal cancer screening, and recommended immunizations   Immunization History  Administered Date(s) Administered  . Hepatitis B, adult 05/17/2018, 06/19/2018, 11/16/2018  . Influenza,inj,Quad PF,6+ Mos 01/11/2018, 12/31/2018, 01/03/2020  . Influenza-Unspecified 12/29/2013, 01/15/2015  . MMR 09/25/2000  . Moderna Sars-Covid-2 Vaccination 04/12/2019, 05/10/2019, 02/11/2020  . Tdap 03/29/2011  [email or web address]@ Health Maintenance Due  Topic Date Due  . HIV Screening  Never done  . PAP SMEAR-Modifier  03/12/2017  . PNA vac Low Risk Adult (1 of 2 -  PCV13) Never done    Last skin cancer screening :   < 6 months ago .  She is seen every 6 months by Dr Kellie Moor.   Cc:  She has been recording elevated blood pressure readings , primarily diastolic readings  for the past 3 weeks.  Takes ibuprofen every other day for  Tooth pain. Does not salt  Her food.  No history  of sleep apnea.  No history of snoring but is overweight /obese   Vital Signs: BP 138/90 (BP Location: Left Arm, Patient Position: Sitting, Cuff Size: Normal)   Pulse 78   Temp 98.2 F (36.8 C) (Oral)   Resp 15   Ht '5\' 5"'  (1.651 m)   Wt 196 lb 12.8 oz (89.3 kg)   SpO2 98%   BMI 32.75 kg/m    Exam: General appearance: alert, cooperative and appears stated age Head: Normocephalic, without obvious abnormality, atraumatic.  Thinning hair in a crown distribution  Eyes: conjunctivae/corneas clear. PERRL, EOM's intact. Fundi benign. Ears: normal TM's and external ear canals both ears Nose: Nares normal. Septum midline. Mucosa normal. No drainage or sinus tenderness. Throat: lips, mucosa, and tongue normal; teeth and gums normal Neck: no adenopathy, no carotid bruit, no JVD, supple, symmetrical, trachea midline and thyroid not enlarged, symmetric, no tenderness/mass/nodules Lungs: clear to auscultation bilaterally Breasts: normal appearance, no masses or tenderness Heart: regular rate and rhythm, S1, S2 normal, no murmur, click, rub or gallop Abdomen: soft, non-tender; bowel sounds normal; no masses,  no organomegaly Extremities: extremities normal, atraumatic, no cyanosis or edema Pulses: 2+ and symmetric Skin: Skin color, texture, turgor normal. No rashes or lesions Neurologic: Alert and oriented X 3, normal strength and tone. Normal symmetric reflexes. Normal coordination and gait.     End of Life Discussion and Planning   During the course of the visit , End of Life objectives were discussed at length.  Patient HAS  a living will in place AS WELL AS A healthcare power of attorney.  Her lawyer has POA and LW is on [facility] and with [employer]   Review of Opioid Prescriptions    Patient does not have a current opioid prescription Patient's risk factors for opioid use disorder was reviewed and includes  Treatment of pain using non-opioid alternatives was reviewed  and  encouraged   Patient risk for potential substance abuse was assessed and addressed with counselling.    Assessment and Plan:  Hypertension, essential She is elevated today on hctz alone  and by home readings have aloe noted elevations , mostly diastolic.  She has been taking ibuprofen  For tooth pain from recent crown replacements..  Advised to suspend all nsaids for one week , continue HTCZ and repeat BP measurements.  Urine protein screen today was negative    Lab Results  Component Value Date   MICROALBUR 0.9 06/17/2020       Obesity (BMI 30-39.9)   I have addressed  BMI and recommended a low glycemic index diet , meal and snack prepping,  and portion size reduction  To  reduce overeating.  I have also recommended that patient start exercising with a goal of 30 minutes of aerobic exercise a minimum of 5 days per week.   Lab Results  Component Value Date   HGBA1C 5.6 06/11/2020     Mixed hyperlipidemia  Normalized triglycerides and normal LDL and HDL. Marland Kitchen   No medications indicated    Lab Results  Component Value Date  CHOL 174 06/11/2020   HDL 56.60 06/11/2020   LDLCALC 93 06/11/2020   LDLDIRECT 117.0 02/19/2018   TRIG 125.0 06/11/2020   CHOLHDL 3 06/11/2020      Hemochromatosis, hereditary ([place]) .Iron  Stores had been chronically mildly elevated without anemia,  polycythemia or transaminitis .  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 02/2018 : RUQ USG shows diffusely echogenic liver. She was phlebotomized in July 2021   Lab Results  Component Value Date   IRON 136 04/15/2020   TIBC 273 04/15/2020   FERRITIN 60 04/15/2020   Lab Results  Component Value Date   WBC 10.8 (H) 04/15/2020   HGB 14.3 04/15/2020   HCT 40.2 04/15/2020   MCV 92.6 04/15/2020   PLT 304 04/15/2020      Iron overload Phlebotomy was planned for Jan 2021 but postponed due to inclement  weather  Lab Results  Component Value Date   IRON 136 04/15/2020   TIBC 273 04/15/2020   FERRITIN 60 04/15/2020   Lab Results  Component Value Date   WBC 10.8 (H) 04/15/2020   HGB 14.3 04/15/2020   HCT 40.2 04/15/2020   MCV 92.6 04/15/2020   PLT 304 04/15/2020     Welcome to Medicare preventive visit age appropriate education and counseling updated, referrals for preventative services and immunizations addressed, dietary and smoking counseling addressed, most recent labs reviewed.  I have personally reviewed and have noted:  1) the patient's medical and social history 2) The pt's use of alcohol, tobacco, and illicit drugs 3) The patient's current medications and supplements 4) Functional ability including ADL's, fall risk, home safety risk, hearing and visual impairment 5) Diet and physical activities 6) Evidence for depression or mood disorder 7) The patient's height, weight, and BMI have been recorded in the chart  I have made referrals, and provided counseling and education based on review of the above   Updated Medication List Outpatient Encounter Medications as of 06/17/2020  Medication Sig  . calcium carbonate (TUMS - DOSED IN MG ELEMENTAL CALCIUM) 500 MG chewable tablet Chew 1 tablet by mouth daily.  . hydrochlorothiazide (HYDRODIURIL) 25 MG tablet TAKE 1 TABLET BY MOUTH EVERY DAY  . [EXPIRED] Zoster Vaccine Adjuvanted [facility]) injection Inject 0.5 mLs into the muscle once for 1 dose.  . [DISCONTINUED] ibuprofen (ADVIL) 800 MG tablet Take 1 tablet (800 mg total) by mouth every 8 (eight) hours as needed for mild pain or moderate pain.  . [DISCONTINUED] ondansetron (ZOFRAN ODT) 4 MG disintegrating tablet Take 1 tablet (4 mg total) by mouth every 8 (eight) hours as needed for nausea or vomiting. (Patient not taking: Reported on 06/24/2019)   No facility-administered encounter medications on file as of 06/17/2020.

## 2020-06-17 NOTE — Assessment & Plan Note (Addendum)
She is elevated today on hctz alone  and by home readings have aloe noted elevations , mostly diastolic.  She has been taking ibuprofen  For tooth pain from recent crown replacements..  Advised to suspend all nsaids for one week , continue HTCZ and repeat BP measurements.  Urine protein screen today was negative    Lab Results  Component Value Date   MICROALBUR 0.9 06/17/2020

## 2020-06-18 LAB — MICROALBUMIN / CREATININE URINE RATIO
Creatinine,U: 127.8 mg/dL
Microalb Creat Ratio: 0.7 mg/g (ref 0.0–30.0)
Microalb, Ur: 0.9 mg/dL (ref 0.0–1.9)

## 2020-06-20 NOTE — Assessment & Plan Note (Signed)
Normalized triglycerides and normal LDL and HDL. Marland Kitchen   No medications indicated    Lab Results  Component Value Date   CHOL 174 06/11/2020   HDL 56.60 06/11/2020   LDLCALC 93 06/11/2020   LDLDIRECT 117.0 02/19/2018   TRIG 125.0 06/11/2020   CHOLHDL 3 06/11/2020

## 2020-06-20 NOTE — Assessment & Plan Note (Signed)

## 2020-06-20 NOTE — Assessment & Plan Note (Addendum)
.  Iron  Stores had been chronically mildly elevated without anemia,  polycythemia or transaminitis .  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 02/2018 : RUQ USG shows diffusely echogenic liver. She was phlebotomized in July 2021   Lab Results  Component Value Date   IRON 136 04/15/2020   TIBC 273 04/15/2020   FERRITIN 60 04/15/2020   Lab Results  Component Value Date   WBC 10.8 (H) 04/15/2020   HGB 14.3 04/15/2020   HCT 40.2 04/15/2020   MCV 92.6 04/15/2020   PLT 304 04/15/2020

## 2020-06-20 NOTE — Assessment & Plan Note (Signed)
I have addressed  BMI and recommended a low glycemic index diet , meal and snack prepping,  and portion size reduction  To  reduce overeating.  I have also recommended that patient start exercising with a goal of 30 minutes of aerobic exercise a minimum of 5 days per week.   Lab Results  Component Value Date   HGBA1C 5.6 06/11/2020

## 2020-06-20 NOTE — Assessment & Plan Note (Signed)
Phlebotomy was planned for Jan 2021 but postponed due to inclement weather  Lab Results  Component Value Date   IRON 136 04/15/2020   TIBC 273 04/15/2020   FERRITIN 60 04/15/2020   Lab Results  Component Value Date   WBC 10.8 (H) 04/15/2020   HGB 14.3 04/15/2020   HCT 40.2 04/15/2020   MCV 92.6 04/15/2020   PLT 304 04/15/2020

## 2020-06-29 ENCOUNTER — Other Ambulatory Visit: Payer: Self-pay | Admitting: Internal Medicine

## 2020-06-29 DIAGNOSIS — I10 Essential (primary) hypertension: Secondary | ICD-10-CM

## 2020-06-29 MED ORDER — TELMISARTAN 20 MG PO TABS: 20.0000 mg | ORAL_TABLET | Freq: Every day | ORAL | 1 refills | Status: DC

## 2020-07-08 ENCOUNTER — Other Ambulatory Visit (INDEPENDENT_AMBULATORY_CARE_PROVIDER_SITE_OTHER): Payer: Medicare Other

## 2020-07-08 ENCOUNTER — Other Ambulatory Visit: Payer: Self-pay

## 2020-07-08 DIAGNOSIS — I10 Essential (primary) hypertension: Secondary | ICD-10-CM | POA: Diagnosis not present

## 2020-07-08 LAB — BASIC METABOLIC PANEL
BUN: 17 mg/dL (ref 6–23)
CO2: 30 mEq/L (ref 19–32)
Calcium: 9.3 mg/dL (ref 8.4–10.5)
Chloride: 102 mEq/L (ref 96–112)
Creatinine, Ser: 0.92 mg/dL (ref 0.40–1.20)
GFR: 65.51 mL/min (ref >60.00)
Glucose, Bld: 135 mg/dL — ABNORMAL HIGH (ref 70–99)
Potassium: 3.5 mEq/L (ref 3.5–5.1)
Sodium: 139 mEq/L (ref 135–145)

## 2020-07-10 ENCOUNTER — Other Ambulatory Visit: Payer: Self-pay

## 2020-07-13 ENCOUNTER — Inpatient Hospital Stay: Payer: Medicare Other | Attending: Oncology

## 2020-07-13 DIAGNOSIS — Z9049 Acquired absence of other specified parts of digestive tract: Secondary | ICD-10-CM | POA: Diagnosis not present

## 2020-07-13 DIAGNOSIS — Z79899 Other long term (current) drug therapy: Secondary | ICD-10-CM | POA: Insufficient documentation

## 2020-07-13 DIAGNOSIS — I10 Essential (primary) hypertension: Secondary | ICD-10-CM | POA: Insufficient documentation

## 2020-07-13 DIAGNOSIS — Z836 Family history of other diseases of the respiratory system: Secondary | ICD-10-CM | POA: Diagnosis not present

## 2020-07-13 DIAGNOSIS — Z833 Family history of diabetes mellitus: Secondary | ICD-10-CM | POA: Insufficient documentation

## 2020-07-13 DIAGNOSIS — Z803 Family history of malignant neoplasm of breast: Secondary | ICD-10-CM | POA: Insufficient documentation

## 2020-07-13 LAB — HEPATIC FUNCTION PANEL
ALT: 19 U/L (ref 0–44)
AST: 24 U/L (ref 15–41)
Albumin: 4 g/dL (ref 3.5–5.0)
Alkaline Phosphatase: 66 U/L (ref 38–126)
Bilirubin, Direct: <0.1 mg/dL (ref 0.0–0.2)
Total Bilirubin: 0.9 mg/dL (ref 0.3–1.2)
Total Protein: 6.9 g/dL (ref 6.5–8.1)

## 2020-07-13 LAB — CBC WITH DIFFERENTIAL/PLATELET
Abs Immature Granulocytes: 0.06 10*3/uL (ref 0.00–0.07)
Basophils Absolute: 0.1 10*3/uL (ref 0.0–0.1)
Basophils Relative: 1 %
Eosinophils Absolute: 0.3 10*3/uL (ref 0.0–0.5)
Eosinophils Relative: 3 %
HCT: 42.5 % (ref 36.0–46.0)
Hemoglobin: 14.6 g/dL (ref 12.0–15.0)
Immature Granulocytes: 1 %
Lymphocytes Relative: 28 %
Lymphs Abs: 2.8 10*3/uL (ref 0.7–4.0)
MCH: 32.2 pg (ref 26.0–34.0)
MCHC: 34.4 g/dL (ref 30.0–36.0)
MCV: 93.6 fL (ref 80.0–100.0)
Monocytes Absolute: 0.6 10*3/uL (ref 0.1–1.0)
Monocytes Relative: 7 %
Neutro Abs: 5.9 10*3/uL (ref 1.7–7.7)
Neutrophils Relative %: 60 %
Platelets: 315 10*3/uL (ref 150–400)
RBC: 4.54 MIL/uL (ref 3.87–5.11)
RDW: 12.3 % (ref 11.5–15.5)
WBC: 9.7 10*3/uL (ref 4.0–10.5)
nRBC: 0 % (ref 0.0–0.2)

## 2020-07-13 LAB — IRON AND TIBC
Iron: 163 ug/dL (ref 28–170)
Saturation Ratios: 62 % — ABNORMAL HIGH (ref 10.4–31.8)
TIBC: 265 ug/dL (ref 250–450)
UIBC: 102 ug/dL

## 2020-07-13 LAB — FERRITIN: Ferritin: 81 ng/mL (ref 11–307)

## 2020-07-14 ENCOUNTER — Other Ambulatory Visit: Payer: Self-pay | Admitting: Internal Medicine

## 2020-07-15 ENCOUNTER — Other Ambulatory Visit: Payer: Self-pay

## 2020-07-15 ENCOUNTER — Inpatient Hospital Stay: Payer: Medicare Other

## 2020-07-15 ENCOUNTER — Encounter: Payer: Self-pay | Admitting: Oncology

## 2020-07-15 ENCOUNTER — Inpatient Hospital Stay: Payer: Medicare Other | Admitting: Oncology

## 2020-07-15 DIAGNOSIS — I10 Essential (primary) hypertension: Secondary | ICD-10-CM | POA: Diagnosis not present

## 2020-07-15 DIAGNOSIS — Z836 Family history of other diseases of the respiratory system: Secondary | ICD-10-CM | POA: Diagnosis not present

## 2020-07-15 DIAGNOSIS — Z79899 Other long term (current) drug therapy: Secondary | ICD-10-CM | POA: Diagnosis not present

## 2020-07-15 DIAGNOSIS — Z803 Family history of malignant neoplasm of breast: Secondary | ICD-10-CM | POA: Diagnosis not present

## 2020-07-15 DIAGNOSIS — Z9049 Acquired absence of other specified parts of digestive tract: Secondary | ICD-10-CM | POA: Diagnosis not present

## 2020-07-15 DIAGNOSIS — Z833 Family history of diabetes mellitus: Secondary | ICD-10-CM | POA: Diagnosis not present

## 2020-07-15 NOTE — Progress Notes (Signed)
Patient here for follow up. No new concerns voiced.  °

## 2020-07-15 NOTE — Progress Notes (Signed)
Patient received 300 ml phlebotomy. Tolerated procedure very well. VSS. Feeling well at time of discharge.

## 2020-07-15 NOTE — Progress Notes (Signed)
Hematology/Oncology follow up note [facility] Telephone:(336) [phone] Fax:(336) [phone]   Patient Care Team: Crecencio Mc, MD as PCP - General (Internal Medicine) Crecencio Mc, MD (Internal Medicine) Bary Castilla Forest Gleason, MD (General Surgery) Earlie Server, MD as Consulting Physician (Hematology and Oncology)  REFERRING PROVIDER: Crecencio Mc, MD  CHIEF COMPLAINTS/REASON FOR VISIT:  Patient presented to follow-up for management of hemochromatosis and iron overload.  HISTORY OF PRESENTING ILLNESS:  Rhonda Brock is a  65 y.o.  female with PMH listed below who was presents for follow up of hereditary hemochromatosis, homozygous C282Y mutation. #   She has followed up with gastroenterologist and has received hepatitis vaccination.  INTERVAL HISTORY Rhonda Brock is a 65 y.o. female who has above history reviewed by me today presents for follow up visit for management of hereditary hemochromatosis with iron overload. Problems and complaints are listed below: She has no new complaints.  January 2022 appointment was canceled due to weather.  Denies joint pain, fatigue, sob . Review of Systems  Constitutional: Negative for appetite change, chills, fatigue and fever.  HENT:   Negative for hearing loss and voice change.   Eyes: Negative for eye problems.  Respiratory: Negative for chest tightness and cough.   Cardiovascular: Negative for chest pain.  Gastrointestinal: Negative for abdominal distention, abdominal pain and blood in stool.  Endocrine: Negative for hot flashes.  Genitourinary: Negative for difficulty urinating and frequency.   Musculoskeletal: Negative for arthralgias.  Skin: Negative for itching and rash.  Neurological: Negative for extremity weakness.  Hematological: Negative for adenopathy.  Psychiatric/Behavioral: Negative for confusion.    MEDICAL HISTORY:  Past Medical History:  Diagnosis Date  . Anxiety   . Back pain   . Chronic  headaches   . Cyst (solitary) of breast    bilaterally  . GERD (gastroesophageal reflux disease)   . Hemochromatosis, hereditary ([place]) 03/22/2018  . Hypertension     SURGICAL HISTORY: Past Surgical History:  Procedure Laterality Date  . ABDOMINAL HYSTERECTOMY  2001  . APPENDECTOMY  1962  . breast cysts    . BREAST SURGERY  2004, 2005   left breast cyst drained x 2  . CHOLECYSTECTOMY  06/2019  . COLONOSCOPY  06/08/2006  . COLONOSCOPY WITH PROPOFOL N/A 06/29/2016   Procedure: COLONOSCOPY WITH PROPOFOL;  Surgeon: Robert Bellow, MD;  Location: [facility] ENDOSCOPY;  Service: Endoscopy;  Laterality: N/A;  . TONSILLECTOMY  1970    SOCIAL HISTORY: Social History   Socioeconomic History  . Marital status: Married    Spouse name: Not on file  . Number of children: Not on file  . Years of education: 71  . Highest education level: Not on file  Occupational History  . Occupation: Homemaker  Tobacco Use  . Smoking status: Never Smoker  . Smokeless tobacco: Never Used  Vaping Use  . Vaping Use: Never used  Substance and Sexual Activity  . Alcohol use: No  . Drug use: No  . Sexual activity: Not on file  Other Topics Concern  . Not on file  Social History Narrative   Regular exercise-yes   Caffeine use-yes         Social Determinants of Health   Financial Resource Strain: Not on file  Food Insecurity: Not on file  Transportation Needs: Not on file  Physical Activity: Not on file  Stress: Not on file  Social Connections: Not on file  Intimate Partner Violence: Not on file    FAMILY HISTORY: Family  History  Problem Relation Age of Onset  . Cancer Mother 6       Breast Cancer-Mastectomy, Also Abdominal cancer  . Asthma Father   . Diabetes Father     ALLERGIES:  has No Known Allergies.  MEDICATIONS:  Current Outpatient Medications  Medication Sig Dispense Refill  . calcium carbonate (TUMS - DOSED IN MG ELEMENTAL CALCIUM) 500 MG chewable tablet Chew 1 tablet by  mouth daily.    . hydrochlorothiazide (HYDRODIURIL) 25 MG tablet TAKE 1 TABLET BY MOUTH EVERY DAY 90 tablet 0  . telmisartan (MICARDIS) 20 MG tablet Take 1 tablet (20 mg total) by mouth daily. 30 tablet 1   No current facility-administered medications for this visit.     PHYSICAL EXAMINATION: ECOG PERFORMANCE STATUS: 0 - Asymptomatic There were no vitals filed for this visit. There were no vitals filed for this visit.  Physical Exam Constitutional:      General: She is not in acute distress. HENT:     Head: Normocephalic and atraumatic.  Eyes:     General: No scleral icterus.    Pupils: Pupils are equal, round, and reactive to light.  Cardiovascular:     Rate and Rhythm: Normal rate and regular rhythm.     Heart sounds: Normal heart sounds.  Pulmonary:     Effort: Pulmonary effort is normal. No respiratory distress.     Breath sounds: No wheezing.  Abdominal:     General: Bowel sounds are normal. There is no distension.     Palpations: Abdomen is soft. There is no mass.     Tenderness: There is no abdominal tenderness.  Musculoskeletal:        General: No deformity. Normal range of motion.     Cervical back: Normal range of motion and neck supple.  Skin:    General: Skin is warm and dry.     Findings: No erythema or rash.  Neurological:     Mental Status: She is alert and oriented to person, place, and time. Mental status is at baseline.     Cranial Nerves: No cranial nerve deficit.     Coordination: Coordination normal.  Psychiatric:        Mood and Affect: Mood normal.      LABORATORY DATA:  I have reviewed the data as listed Lab Results  Component Value Date   WBC 9.7 07/13/2020   HGB 14.6 07/13/2020   HCT 42.5 07/13/2020   MCV 93.6 07/13/2020   PLT 315 07/13/2020   Recent Labs    10/10/19 1352 04/15/20 1306 06/11/20 0950 07/08/20 1018 07/13/20 1257  NA  --   --  139 139  --   K  --   --  3.9 3.5  --   CL  --   --  102 102  --   CO2  --   --  31  30  --   GLUCOSE  --   --  96 135*  --   BUN  --   --  19 17  --   CREATININE  --   --  0.85 0.92  --   CALCIUM  --   --  9.3 9.3  --   PROT 6.9 6.9 6.2  --  6.9  ALBUMIN 4.0 4.0 3.9  --  4.0  AST 23 26 20   --  24  ALT 19 21 17   --  19  ALKPHOS 62 62 59  --  66  BILITOT 0.9 1.0 0.8  --  0.9  BILIDIR 0.1 0.1  --   --  <0.1  IBILI 0.8 0.9  --   --  NOT CALCULATED   Iron/TIBC/Ferritin/ %Sat    Component Value Date/Time   IRON 163 07/13/2020 1257   IRON 130 12/16/2011 1459   TIBC 265 07/13/2020 1257   TIBC 226 (L) 12/16/2011 1459   FERRITIN 81 07/13/2020 1257   FERRITIN 367 12/16/2011 1459   IRONPCTSAT 62 (H) 07/13/2020 1257   IRONPCTSAT 66 (H) 02/19/2018 0830     RADIOGRAPHIC STUDIES: I have personally reviewed the radiological images as listed and agreed with the findings in the report.  10/06/2017 Mammogram Negative, recommend annual screening mammogram.   ASSESSMENT & PLAN:  1. Hemochromatosis, hereditary ([place])   2. Iron overload   Homozygous hemochromatosis mutation/iron overload Labs are reviewed and discussed with patient. Ferritin has increased to 81, iron saturation 62. Recommend patient to proceed with phlebotomy 300 cc today. Follow-up every 6 months Patient has follow-up with gastroenterology and was recommended no need for liver biopsy at this point.  She is clinically doing well. Continue follow-up with gastroenterology Avoid iron supplementation, vitamin C supplementation.  All questions were answered. The patient knows to call the clinic with any problems questions or concerns.  Return of visit: She will have blood work done and follow-up in 6 months for possible phlebotomy.   Earlie Server, MD, PhD Hematology Oncology [facility] Pager- [phone] 07/15/2020

## 2020-07-22 ENCOUNTER — Other Ambulatory Visit: Payer: Self-pay | Admitting: Internal Medicine

## 2020-08-13 ENCOUNTER — Encounter: Payer: Self-pay | Admitting: Oncology

## 2020-08-14 ENCOUNTER — Other Ambulatory Visit: Payer: Self-pay | Admitting: Internal Medicine

## 2020-09-06 ENCOUNTER — Other Ambulatory Visit: Payer: Self-pay | Admitting: Internal Medicine

## 2020-09-09 ENCOUNTER — Other Ambulatory Visit: Payer: Self-pay | Admitting: Internal Medicine

## 2020-09-11 ENCOUNTER — Encounter: Payer: Self-pay | Admitting: Oncology

## 2020-09-30 ENCOUNTER — Other Ambulatory Visit: Payer: Self-pay | Admitting: Internal Medicine

## 2020-10-08 IMAGING — US ULTRASOUND ABDOMEN LIMITED
1 series · 14 of 25 positions shown · non-contrast
Comparison: Ultrasounds 05/04/2018 and 03/16/2018.

CLINICAL DATA: 63-year-old female with hemochromatosis.

EXAM:
ULTRASOUND ABDOMEN LIMITED RIGHT UPPER QUADRANT

[Series 1: ultrasound abdomen limited · 14 of 70 slices shown]
[im 1/70]
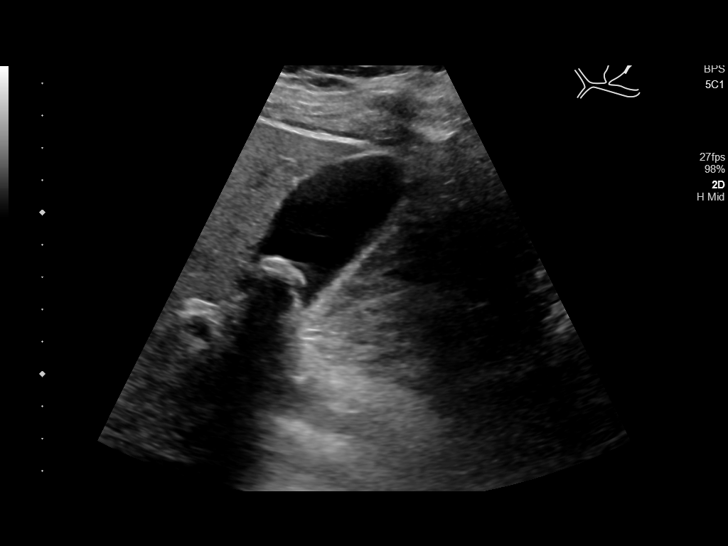
[im 6/70]
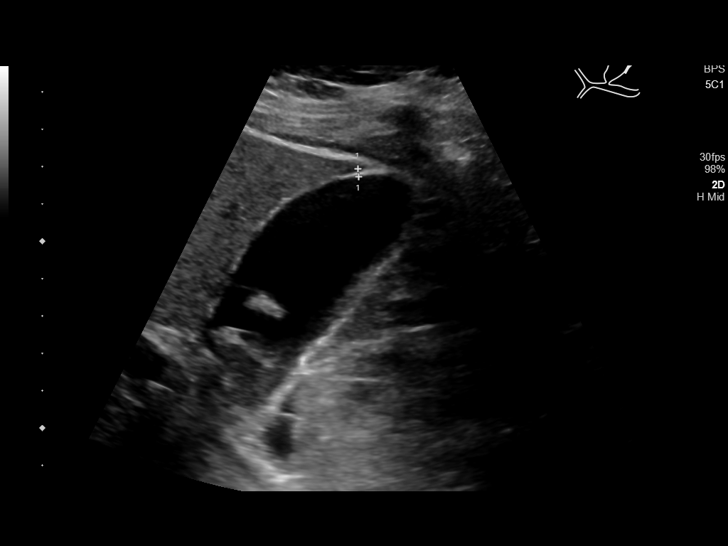
[im 12/70]
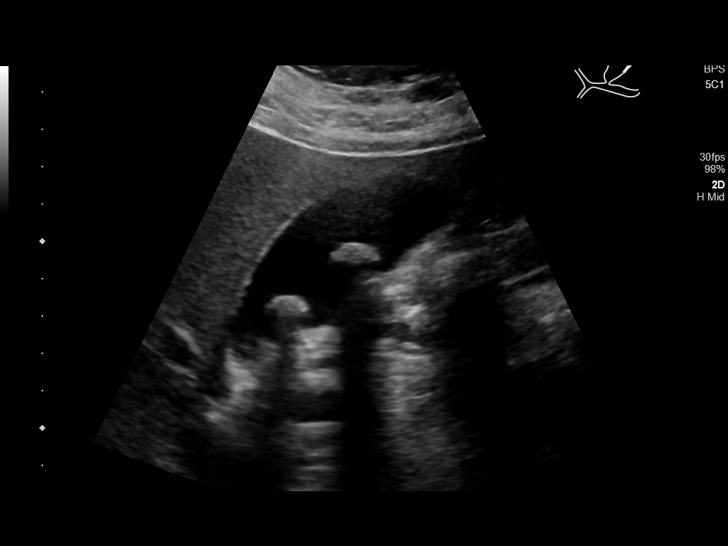
[im 18/70]
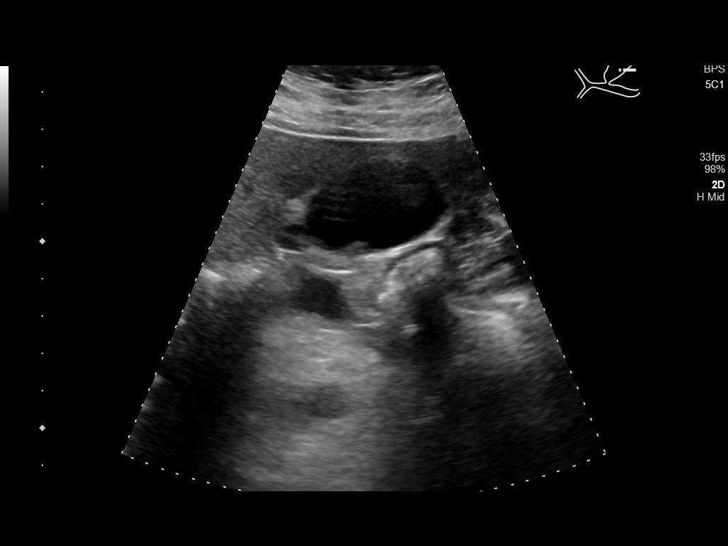
[im 24/70]
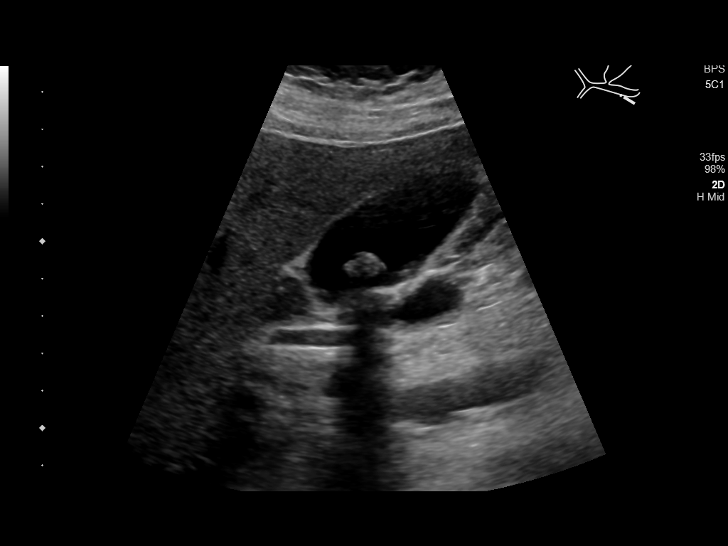
[im 26/70]
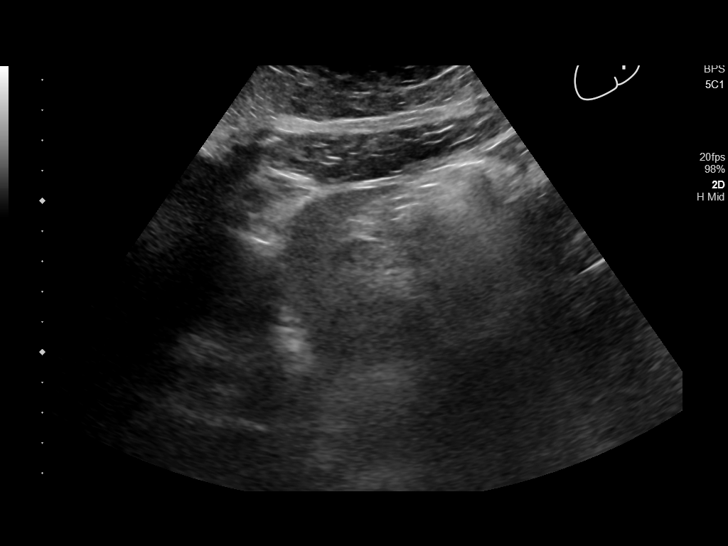
[im 32/70]
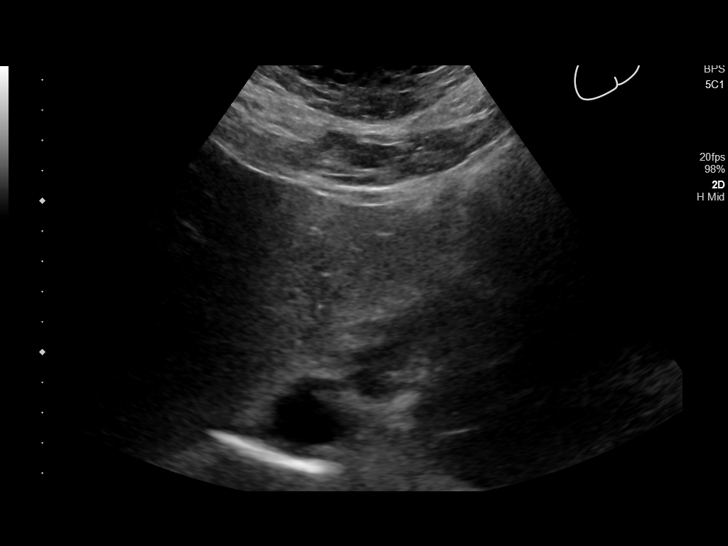
[im 38/70]
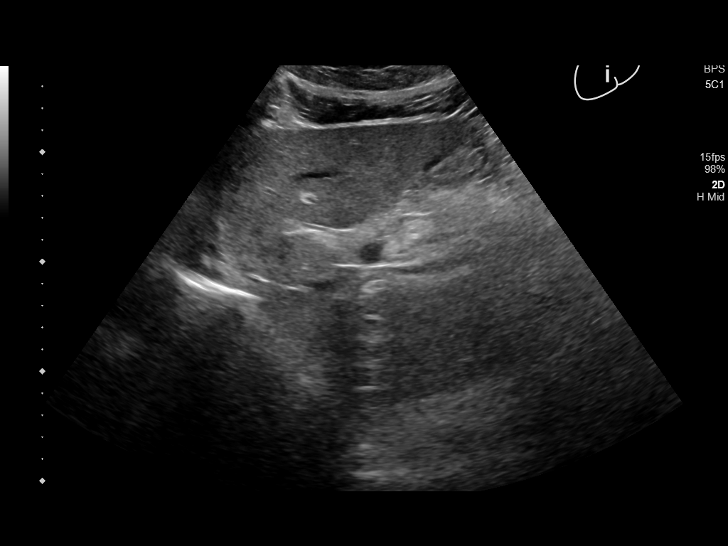
[im 44/70]
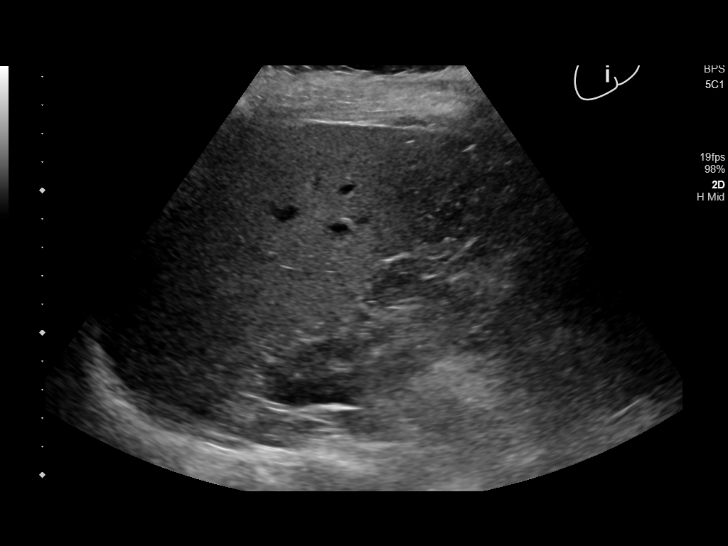
[im 47/70]
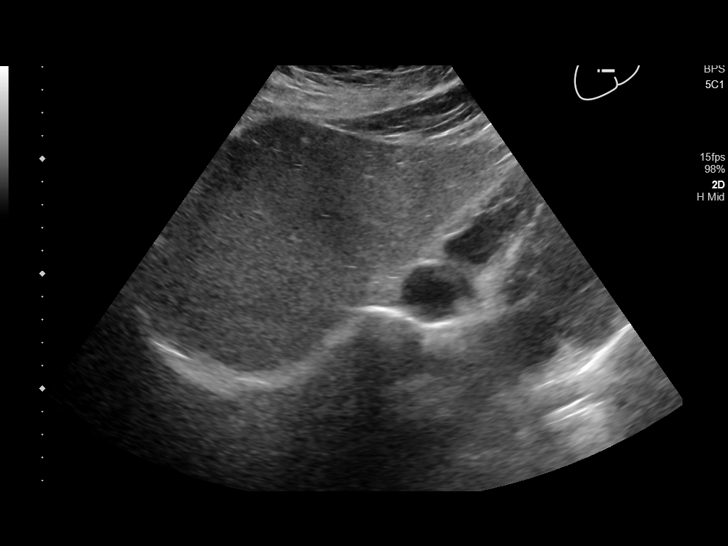
[im 52/70]
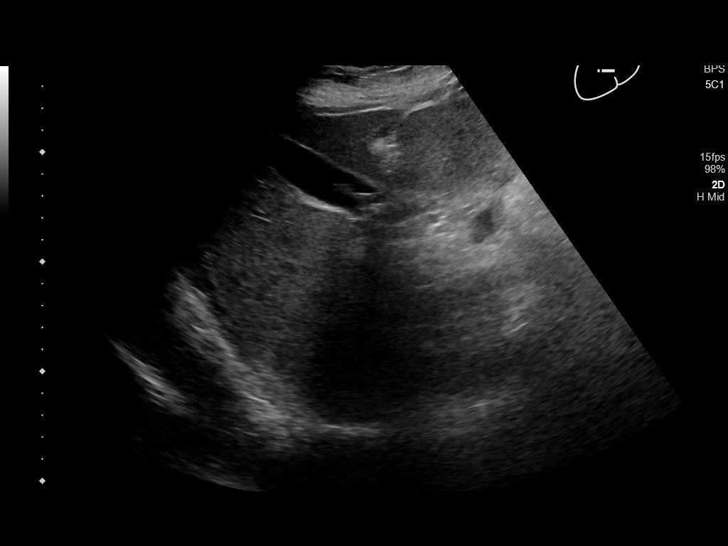
[im 58/70]
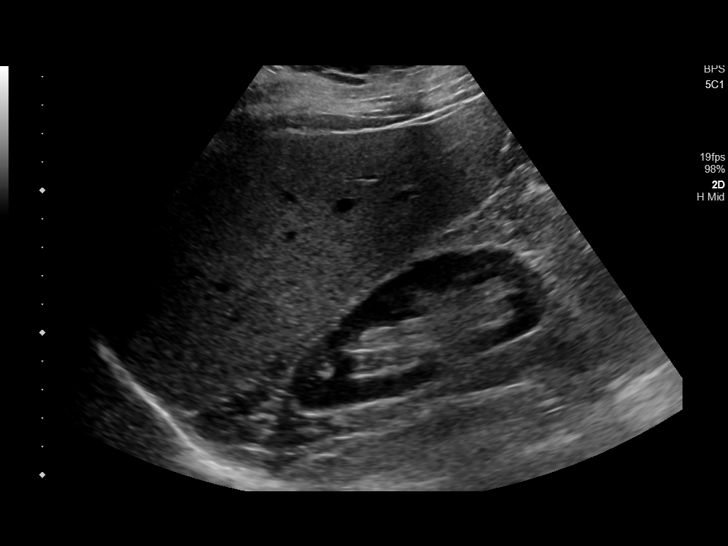
[im 64/70]
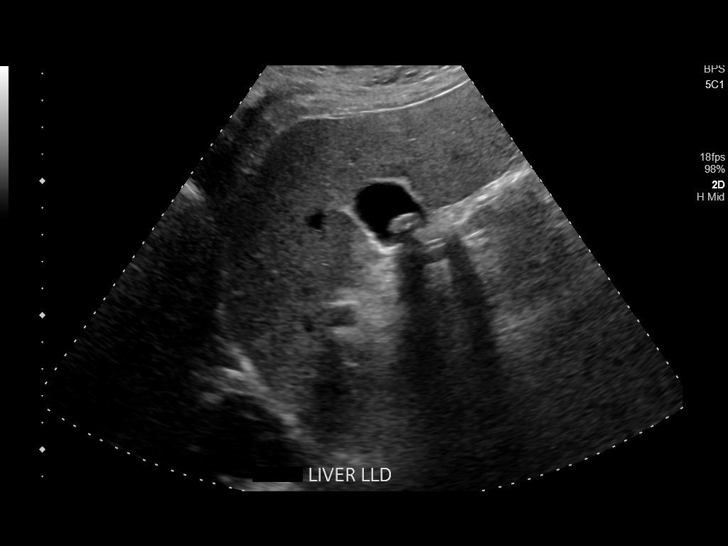
[im 70/70]
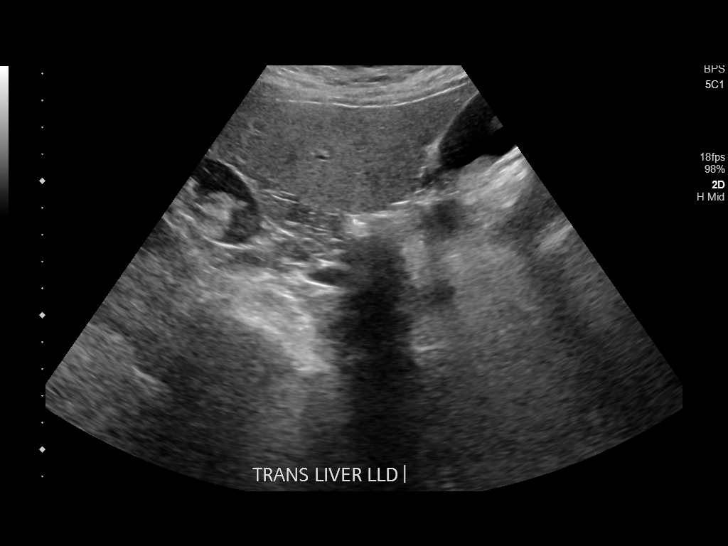

[14 of 25 positions shown; findings below may reference images not displayed]

FINDINGS: Gallbladder:

Chronic cholelithiasis with numerous stones. Gallstones individually
estimated at 17 millimeters (image 3). Gallbladder wall thickness
remains normal. No sonographic Murphy sign elicited. No
pericholecystic fluid.

Common bile duct:

Diameter: 5 millimeters, normal.

Liver:

Chronically echogenic liver (image 46). No discrete liver lesion. No
intrahepatic biliary ductal dilatation. Portal vein is patent on
color Doppler imaging with normal direction of blood flow towards
the liver.

Other: Negative visible right kidney.
IMPRESSION: Stable ultrasound appearance of the right upper quadrant since 1280.
Echogenic liver and chronic cholelithiasis.

## 2020-10-09 LAB — HM PAP SMEAR: HM Pap smear: NORMAL

## 2020-10-23 ENCOUNTER — Other Ambulatory Visit: Payer: Self-pay | Admitting: Internal Medicine

## 2020-11-14 ENCOUNTER — Other Ambulatory Visit: Payer: Self-pay | Admitting: Internal Medicine

## 2020-11-16 DIAGNOSIS — H04123 Dry eye syndrome of bilateral lacrimal glands: Secondary | ICD-10-CM | POA: Diagnosis not present

## 2020-11-16 DIAGNOSIS — H25093 Other age-related incipient cataract, bilateral: Secondary | ICD-10-CM | POA: Diagnosis not present

## 2020-11-16 DIAGNOSIS — H40013 Open angle with borderline findings, low risk, bilateral: Secondary | ICD-10-CM | POA: Diagnosis not present

## 2020-12-06 ENCOUNTER — Other Ambulatory Visit: Payer: Self-pay | Admitting: Internal Medicine

## 2020-12-08 ENCOUNTER — Other Ambulatory Visit: Payer: Self-pay | Admitting: Internal Medicine

## 2021-01-08 ENCOUNTER — Other Ambulatory Visit: Payer: Self-pay | Admitting: Internal Medicine

## 2021-01-13 ENCOUNTER — Other Ambulatory Visit: Payer: Self-pay

## 2021-01-13 ENCOUNTER — Inpatient Hospital Stay: Payer: Medicare Other | Attending: Oncology

## 2021-01-13 LAB — CBC WITH DIFFERENTIAL/PLATELET
Abs Immature Granulocytes: 0.08 10*3/uL — ABNORMAL HIGH (ref 0.00–0.07)
Basophils Absolute: 0.1 10*3/uL (ref 0.0–0.1)
Basophils Relative: 1 %
Eosinophils Absolute: 0.2 10*3/uL (ref 0.0–0.5)
Eosinophils Relative: 2 %
HCT: 39.6 % (ref 36.0–46.0)
Hemoglobin: 13.8 g/dL (ref 12.0–15.0)
Immature Granulocytes: 1 %
Lymphocytes Relative: 31 %
Lymphs Abs: 2.5 10*3/uL (ref 0.7–4.0)
MCH: 33 pg (ref 26.0–34.0)
MCHC: 34.8 g/dL (ref 30.0–36.0)
MCV: 94.7 fL (ref 80.0–100.0)
Monocytes Absolute: 0.5 10*3/uL (ref 0.1–1.0)
Monocytes Relative: 6 %
Neutro Abs: 4.9 10*3/uL (ref 1.7–7.7)
Neutrophils Relative %: 59 %
Platelets: 300 10*3/uL (ref 150–400)
RBC: 4.18 MIL/uL (ref 3.87–5.11)
RDW: 12.6 % (ref 11.5–15.5)
WBC: 8.3 10*3/uL (ref 4.0–10.5)
nRBC: 0 % (ref 0.0–0.2)

## 2021-01-13 LAB — FERRITIN: Ferritin: 116 ng/mL (ref 11–307)

## 2021-01-13 LAB — IRON AND TIBC
Iron: 151 ug/dL (ref 28–170)
Saturation Ratios: 55 % — ABNORMAL HIGH (ref 10.4–31.8)
TIBC: 276 ug/dL (ref 250–450)
UIBC: 125 ug/dL

## 2021-01-13 LAB — HEPATIC FUNCTION PANEL
ALT: 18 U/L (ref 0–44)
AST: 21 U/L (ref 15–41)
Albumin: 4 g/dL (ref 3.5–5.0)
Alkaline Phosphatase: 65 U/L (ref 38–126)
Bilirubin, Direct: 0.2 mg/dL (ref 0.0–0.2)
Indirect Bilirubin: 0.4 mg/dL (ref 0.3–0.9)
Total Bilirubin: 0.6 mg/dL (ref 0.3–1.2)
Total Protein: 6.8 g/dL (ref 6.5–8.1)

## 2021-01-15 ENCOUNTER — Inpatient Hospital Stay: Payer: Medicare Other

## 2021-01-15 ENCOUNTER — Inpatient Hospital Stay: Payer: Medicare Other | Admitting: Oncology

## 2021-01-15 NOTE — Progress Notes (Deleted)
Hematology/Oncology follow up note [facility] Telephone:(336) [phone] Fax:(336) [phone]   Patient Care Team: Crecencio Mc, MD as PCP - General (Internal Medicine) Crecencio Mc, MD (Internal Medicine) Bary Castilla Forest Gleason, MD (General Surgery) Earlie Server, MD as Consulting Physician (Hematology and Oncology)  REFERRING PROVIDER: Crecencio Mc, MD  CHIEF COMPLAINTS/REASON FOR VISIT:  Patient presented to follow-up for management of hemochromatosis and iron overload.  HISTORY OF PRESENTING ILLNESS:  Rhonda Brock is a  65 y.o.  female with PMH listed below who was presents for follow up of hereditary hemochromatosis, homozygous C282Y mutation. #   She has followed up with gastroenterologist and has received hepatitis vaccination.  INTERVAL HISTORY Rhonda Brock is a 65 year old female with above history who presents for routine follow-up for hereditary hemochromatosis with iron overload.  She denies any new concerning symptoms.  Her last phlebotomy was in April 2022. Marland Kitchen Review of Systems  Constitutional:  Negative for appetite change, chills, fatigue and fever.  HENT:   Negative for hearing loss and voice change.   Eyes:  Negative for eye problems.  Respiratory:  Negative for chest tightness and cough.   Cardiovascular:  Negative for chest pain.  Gastrointestinal:  Negative for abdominal distention, abdominal pain and blood in stool.  Endocrine: Negative for hot flashes.  Genitourinary:  Negative for difficulty urinating and frequency.   Musculoskeletal:  Negative for arthralgias.  Skin:  Negative for itching and rash.  Neurological:  Negative for extremity weakness.  Hematological:  Negative for adenopathy.  Psychiatric/Behavioral:  Negative for confusion.    MEDICAL HISTORY:  Past Medical History:  Diagnosis Date   Anxiety    Back pain    Chronic headaches    Cyst (solitary) of breast    bilaterally   GERD (gastroesophageal reflux disease)     Hemochromatosis, hereditary ([place]) 03/22/2018   Hypertension     SURGICAL HISTORY: Past Surgical History:  Procedure Laterality Date   ABDOMINAL HYSTERECTOMY  2001   APPENDECTOMY  1962   breast cysts     BREAST SURGERY  2004, 2005   left breast cyst drained x 2   CHOLECYSTECTOMY  06/2019   COLONOSCOPY  06/08/2006   COLONOSCOPY WITH PROPOFOL N/A 06/29/2016   Procedure: COLONOSCOPY WITH PROPOFOL;  Surgeon: Robert Bellow, MD;  Location: [facility] ENDOSCOPY;  Service: Endoscopy;  Laterality: N/A;   TONSILLECTOMY  1970    SOCIAL HISTORY: Social History   Socioeconomic History   Marital status: Married    Spouse name: Not on file   Number of children: Not on file   Years of education: 14   Highest education level: Not on file  Occupational History   Occupation: Homemaker  Tobacco Use   Smoking status: Never   Smokeless tobacco: Never  Vaping Use   Vaping Use: Never used  Substance and Sexual Activity   Alcohol use: No   Drug use: No   Sexual activity: Not on file  Other Topics Concern   Not on file  Social History Narrative   Regular exercise-yes   Caffeine use-yes         Social Determinants of Health   Financial Resource Strain: Not on file  Food Insecurity: Not on file  Transportation Needs: Not on file  Physical Activity: Not on file  Stress: Not on file  Social Connections: Not on file  Intimate Partner Violence: Not on file    FAMILY HISTORY: Family History  Problem Relation Age of Onset   Cancer  Mother 52       Breast Cancer-Mastectomy, Also Abdominal cancer   Asthma Father    Diabetes Father     ALLERGIES:  has No Known Allergies.  MEDICATIONS:  Current Outpatient Medications  Medication Sig Dispense Refill   calcium carbonate (TUMS - DOSED IN MG ELEMENTAL CALCIUM) 500 MG chewable tablet Chew 1 tablet by mouth daily.     hydrochlorothiazide (HYDRODIURIL) 25 MG tablet TAKE 1 TABLET BY MOUTH EVERY DAY 90 tablet 0   telmisartan (MICARDIS) 20 MG  tablet TAKE 1 TABLET BY MOUTH EVERY DAY 90 tablet 1   No current facility-administered medications for this visit.     PHYSICAL EXAMINATION: ECOG PERFORMANCE STATUS: 0 - Asymptomatic There were no vitals filed for this visit. There were no vitals filed for this visit.  Physical Exam Constitutional:      General: She is not in acute distress. HENT:     Head: Normocephalic and atraumatic.  Eyes:     General: No scleral icterus.    Pupils: Pupils are equal, round, and reactive to light.  Cardiovascular:     Rate and Rhythm: Normal rate and regular rhythm.     Heart sounds: Normal heart sounds.  Pulmonary:     Effort: Pulmonary effort is normal. No respiratory distress.     Breath sounds: No wheezing.  Abdominal:     General: Bowel sounds are normal. There is no distension.     Palpations: Abdomen is soft. There is no mass.     Tenderness: There is no abdominal tenderness.  Musculoskeletal:        General: No deformity. Normal range of motion.     Cervical back: Normal range of motion and neck supple.  Skin:    General: Skin is warm and dry.     Findings: No erythema or rash.  Neurological:     Mental Status: She is alert and oriented to person, place, and time. Mental status is at baseline.     Cranial Nerves: No cranial nerve deficit.     Coordination: Coordination normal.  Psychiatric:        Mood and Affect: Mood normal.     LABORATORY DATA:  I have reviewed the data as listed Lab Results  Component Value Date   WBC 8.3 01/13/2021   HGB 13.8 01/13/2021   HCT 39.6 01/13/2021   MCV 94.7 01/13/2021   PLT 300 01/13/2021   Recent Labs    04/15/20 1306 06/11/20 0950 07/08/20 1018 07/13/20 1257 01/13/21 1329  NA  --  139 139  --   --   K  --  3.9 3.5  --   --   CL  --  102 102  --   --   CO2  --  31 30  --   --   GLUCOSE  --  96 135*  --   --   BUN  --  19 17  --   --   CREATININE  --  0.85 0.92  --   --   CALCIUM  --  9.3 9.3  --   --   PROT 6.9 6.2  --   6.9 6.8  ALBUMIN 4.0 3.9  --  4.0 4.0  AST 26 20  --  24 21  ALT 21 17  --  19 18  ALKPHOS 62 59  --  66 65  BILITOT 1.0 0.8  --  0.9 0.6  BILIDIR 0.1  --   --  <  0.1 0.2  IBILI 0.9  --   --  NOT CALCULATED 0.4    Iron/TIBC/Ferritin/ %Sat    Component Value Date/Time   IRON 151 01/13/2021 1329   IRON 130 12/16/2011 1459   TIBC 276 01/13/2021 1329   TIBC 226 (L) 12/16/2011 1459   FERRITIN 116 01/13/2021 1329   FERRITIN 367 12/16/2011 1459   IRONPCTSAT 55 (H) 01/13/2021 1329   IRONPCTSAT 66 (H) 02/19/2018 0830     RADIOGRAPHIC STUDIES: I have personally reviewed the radiological images as listed and agreed with the findings in the report.  10/06/2017 Mammogram Negative, recommend annual screening mammogram.   ASSESSMENT & PLAN:  No diagnosis found. Homozygous hemochromatosis mutation/iron overload-labs from 01/13/2021 show iron saturation 55% with a ferritin of 116.  Hemoglobin is 13.8 hematocrit 39.6.  She receives intermittent phlebotomies last on 07/15/2020.  Ferritin has increased even after phlebotomy in April.  Recommend phlebotomy 300 mL today.  Follow-up to see Dr. Tasia Catchings in 6 months with labs a few days before.  I spent 25 minutes dedicated to the care of this patient (face-to-face and non-face-to-face) on the date of the encounter to include what is described in the assessment and plan.  All questions were answered. The patient knows to call the clinic with any problems questions or concerns.  Faythe Casa, NP 01/15/2021 1:15 PM

## 2021-01-18 ENCOUNTER — Telehealth: Payer: Self-pay | Admitting: Oncology

## 2021-01-18 NOTE — Telephone Encounter (Signed)
Pt had to cancel appointment last week. Wants to reschedule. Call back at [phone] or [phone]

## 2021-01-19 ENCOUNTER — Encounter: Payer: Self-pay | Admitting: Oncology

## 2021-01-26 ENCOUNTER — Other Ambulatory Visit: Payer: Self-pay

## 2021-01-26 ENCOUNTER — Inpatient Hospital Stay: Payer: Medicare Other

## 2021-01-26 ENCOUNTER — Inpatient Hospital Stay: Payer: Medicare Other | Attending: Oncology | Admitting: Oncology

## 2021-01-26 ENCOUNTER — Encounter: Payer: Self-pay | Admitting: Oncology

## 2021-01-26 DIAGNOSIS — Z90710 Acquired absence of both cervix and uterus: Secondary | ICD-10-CM | POA: Diagnosis not present

## 2021-01-26 DIAGNOSIS — I10 Essential (primary) hypertension: Secondary | ICD-10-CM | POA: Diagnosis not present

## 2021-01-26 DIAGNOSIS — Z1231 Encounter for screening mammogram for malignant neoplasm of breast: Secondary | ICD-10-CM | POA: Diagnosis not present

## 2021-01-26 DIAGNOSIS — Z803 Family history of malignant neoplasm of breast: Secondary | ICD-10-CM | POA: Diagnosis not present

## 2021-01-26 LAB — HM MAMMOGRAPHY

## 2021-01-26 NOTE — Progress Notes (Signed)
Feeling well. No concerns or complaints today. Here for possible phlebotomy.

## 2021-01-26 NOTE — Progress Notes (Signed)
Hematology/Oncology follow up note [facility] Telephone:(336) [phone] Fax:(336) [phone]   Patient Care Team: Crecencio Mc, MD as PCP - General (Internal Medicine) Crecencio Mc, MD (Internal Medicine) Bary Castilla Forest Gleason, MD (General Surgery) Earlie Server, MD as Consulting Physician (Hematology and Oncology)  REFERRING PROVIDER: Crecencio Mc, MD  CHIEF COMPLAINTS/REASON FOR VISIT:  Patient presented to follow-up for management of hemochromatosis and iron overload.  HISTORY OF PRESENTING ILLNESS:  Rhonda Brock is a  65 y.o.  female with PMH listed below who was presents for follow up of hereditary hemochromatosis, homozygous C282Y mutation. #   She has followed up with gastroenterologist and has received hepatitis vaccination.  INTERVAL HISTORY Rhonda Brock is a 65 year old female with above history who presents for routine follow-up for hereditary hemochromatosis with iron overload.  She denies any new concerning symptoms.  Her last phlebotomy was in April 2022. Marland Kitchen Review of Systems  Constitutional:  Negative for appetite change, fatigue, fever and unexpected weight change.  HENT:   Negative for nosebleeds, sore throat and trouble swallowing.   Eyes: Negative.   Respiratory: Negative.  Negative for cough, shortness of breath and wheezing.   Cardiovascular: Negative.  Negative for chest pain and leg swelling.  Gastrointestinal:  Negative for abdominal pain, blood in stool, constipation, diarrhea, nausea and vomiting.  Endocrine: Negative.   Genitourinary: Negative.  Negative for bladder incontinence, hematuria and nocturia.   Musculoskeletal: Negative.  Negative for back pain and flank pain.  Skin: Negative.   Neurological: Negative.  Negative for dizziness, headaches, light-headedness and numbness.  Hematological: Negative.   Psychiatric/Behavioral: Negative.  Negative for confusion. The patient is not nervous/anxious.    MEDICAL HISTORY:  Past  Medical History:  Diagnosis Date   Anxiety    Back pain    Chronic headaches    Cyst (solitary) of breast    bilaterally   GERD (gastroesophageal reflux disease)    Hemochromatosis, hereditary ([place]) 03/22/2018   Hypertension     SURGICAL HISTORY: Past Surgical History:  Procedure Laterality Date   ABDOMINAL HYSTERECTOMY  2001   APPENDECTOMY  1962   breast cysts     BREAST SURGERY  2004, 2005   left breast cyst drained x 2   CHOLECYSTECTOMY  06/2019   COLONOSCOPY  06/08/2006   COLONOSCOPY WITH PROPOFOL N/A 06/29/2016   Procedure: COLONOSCOPY WITH PROPOFOL;  Surgeon: Robert Bellow, MD;  Location: [facility] ENDOSCOPY;  Service: Endoscopy;  Laterality: N/A;   TONSILLECTOMY  1970    SOCIAL HISTORY: Social History   Socioeconomic History   Marital status: Married    Spouse name: Not on file   Number of children: Not on file   Years of education: 14   Highest education level: Not on file  Occupational History   Occupation: Homemaker  Tobacco Use   Smoking status: Never   Smokeless tobacco: Never  Vaping Use   Vaping Use: Never used  Substance and Sexual Activity   Alcohol use: No   Drug use: No   Sexual activity: Not on file  Other Topics Concern   Not on file  Social History Narrative   Regular exercise-yes   Caffeine use-yes         Social Determinants of Health   Financial Resource Strain: Not on file  Food Insecurity: Not on file  Transportation Needs: Not on file  Physical Activity: Not on file  Stress: Not on file  Social Connections: Not on file  Intimate Partner Violence: Not  on file    FAMILY HISTORY: Family History  Problem Relation Age of Onset   Cancer Mother 39       Breast Cancer-Mastectomy, Also Abdominal cancer   Asthma Father    Diabetes Father     ALLERGIES:  has No Known Allergies.  MEDICATIONS:  Current Outpatient Medications  Medication Sig Dispense Refill   calcium carbonate (TUMS - DOSED IN MG ELEMENTAL CALCIUM) 500 MG  chewable tablet Chew 1 tablet by mouth daily.     hydrochlorothiazide (HYDRODIURIL) 25 MG tablet TAKE 1 TABLET BY MOUTH EVERY DAY 90 tablet 0   telmisartan (MICARDIS) 20 MG tablet TAKE 1 TABLET BY MOUTH EVERY DAY 90 tablet 1   No current facility-administered medications for this visit.     PHYSICAL EXAMINATION: ECOG PERFORMANCE STATUS: 0 - Asymptomatic There were no vitals filed for this visit. There were no vitals filed for this visit.  Physical Exam Constitutional:      General: She is not in acute distress. HENT:     Head: Normocephalic and atraumatic.  Eyes:     General: No scleral icterus.    Pupils: Pupils are equal, round, and reactive to light.  Cardiovascular:     Rate and Rhythm: Normal rate and regular rhythm.     Heart sounds: Normal heart sounds.  Pulmonary:     Effort: Pulmonary effort is normal. No respiratory distress.     Breath sounds: No wheezing.  Abdominal:     General: Bowel sounds are normal. There is no distension.     Palpations: Abdomen is soft. There is no mass.     Tenderness: There is no abdominal tenderness.  Musculoskeletal:        General: No deformity. Normal range of motion.     Cervical back: Normal range of motion and neck supple.  Skin:    General: Skin is warm and dry.     Findings: No erythema or rash.  Neurological:     Mental Status: She is alert and oriented to person, place, and time. Mental status is at baseline.     Cranial Nerves: No cranial nerve deficit.     Coordination: Coordination normal.  Psychiatric:        Mood and Affect: Mood normal.     LABORATORY DATA:  I have reviewed the data as listed Lab Results  Component Value Date   WBC 8.3 01/13/2021   HGB 13.8 01/13/2021   HCT 39.6 01/13/2021   MCV 94.7 01/13/2021   PLT 300 01/13/2021   Recent Labs    04/15/20 1306 06/11/20 0950 07/08/20 1018 07/13/20 1257 01/13/21 1329  NA  --  139 139  --   --   K  --  3.9 3.5  --   --   CL  --  102 102  --   --    CO2  --  31 30  --   --   GLUCOSE  --  96 135*  --   --   BUN  --  19 17  --   --   CREATININE  --  0.85 0.92  --   --   CALCIUM  --  9.3 9.3  --   --   PROT 6.9 6.2  --  6.9 6.8  ALBUMIN 4.0 3.9  --  4.0 4.0  AST 26 20  --  24 21  ALT 21 17  --  19 18  ALKPHOS 62 59  --  66 65  BILITOT 1.0 0.8  --  0.9 0.6  BILIDIR 0.1  --   --  <0.1 0.2  IBILI 0.9  --   --  NOT CALCULATED 0.4    Iron/TIBC/Ferritin/ %Sat    Component Value Date/Time   IRON 151 01/13/2021 1329   IRON 130 12/16/2011 1459   TIBC 276 01/13/2021 1329   TIBC 226 (L) 12/16/2011 1459   FERRITIN 116 01/13/2021 1329   FERRITIN 367 12/16/2011 1459   IRONPCTSAT 55 (H) 01/13/2021 1329   IRONPCTSAT 66 (H) 02/19/2018 0830     RADIOGRAPHIC STUDIES: I have personally reviewed the radiological images as listed and agreed with the findings in the report.  10/06/2017 Mammogram Negative, recommend annual screening mammogram.   ASSESSMENT & PLAN:  No diagnosis found. Homozygous hemochromatosis mutation/iron overload-labs from 01/13/2021 show iron saturation 55% with a ferritin of 116.  Hemoglobin is 13.8 hematocrit 39.6.  She receives intermittent phlebotomies last on 07/15/2020.  Ferritin has increased even after phlebotomy in April.  Recommend phlebotomy 300 mL today.  Follow-up to see Dr. Tasia Catchings in 6 months with labs a few days before.  Patient mentions that the [employer] may have changed their guidelines allowing for her to donate blood in the future.  Patient would like to have labs drawn and see Dr. Tasia Catchings and if able would like to donate her blood in 6 months.  Disposition-follow-up in 6 months with Dr. Tasia Catchings with labs a few days before.  I spent 15 minutes dedicated to the care of this patient (face-to-face and non-face-to-face) on the date of the encounter to include what is described in the assessment and plan.  All questions were answered. The patient knows to call the clinic with any problems questions or concerns.  Faythe Casa, NP 01/26/2021 11:39 AM

## 2021-01-26 NOTE — Patient Instructions (Signed)

## 2021-01-26 NOTE — Progress Notes (Signed)
300 ml blood removed per order from Rulon Abide NP. Pt tolerated procedure well. VSS. Discharged to home.

## 2021-03-04 ENCOUNTER — Other Ambulatory Visit: Payer: Self-pay | Admitting: Internal Medicine

## 2021-03-26 ENCOUNTER — Other Ambulatory Visit: Payer: Self-pay

## 2021-03-26 ENCOUNTER — Ambulatory Visit (INDEPENDENT_AMBULATORY_CARE_PROVIDER_SITE_OTHER): Payer: Medicare Other | Admitting: Family

## 2021-03-26 ENCOUNTER — Encounter: Payer: Self-pay | Admitting: Family

## 2021-03-26 ENCOUNTER — Ambulatory Visit (INDEPENDENT_AMBULATORY_CARE_PROVIDER_SITE_OTHER): Payer: Medicare Other

## 2021-03-26 VITALS — BP 120/78 | HR 80 | Temp 98.2°F | Ht 65.0 in | Wt 187.2 lb

## 2021-03-26 DIAGNOSIS — M79604 Pain in right leg: Secondary | ICD-10-CM | POA: Diagnosis not present

## 2021-03-26 DIAGNOSIS — M545 Low back pain, unspecified: Secondary | ICD-10-CM | POA: Diagnosis not present

## 2021-03-26 DIAGNOSIS — M2578 Osteophyte, vertebrae: Secondary | ICD-10-CM | POA: Diagnosis not present

## 2021-03-26 MED ORDER — MELOXICAM 7.5 MG PO TABS: 7.5000 mg | ORAL_TABLET | Freq: Every day | ORAL | 1 refills | Status: DC | PRN

## 2021-03-26 NOTE — Progress Notes (Signed)
Subjective:    Patient ID: Rhonda Brock, female    DOB: Feb 03, 1956, 65 y.o.   MRN: [id number]  CC: Rhonda Brock is a 65 y.o. female who presents today for an acute visit.    HPI: Complains of right leg pain x1 week. No pain during the day or while in the office.  Happens at night and wakes her up.  Describes as 'electrical' sensation, 'burning' down right lateral side from buttock to the right knee.    Pain is relieved by walking around. She hasnt tried any medication for this.  No injury or heavy lifting.   Denies any numbness, tingling, saddle anesthesia, fever, rash, trouble urinating, having a bowel movement, hematuria, dysuria, nausea, abdominal pain.  History of hemochromatosis HISTORY:  Past Medical History:  Diagnosis Date   Anxiety    Back pain    Chronic headaches    Cyst (solitary) of breast    bilaterally   GERD (gastroesophageal reflux disease)    Hemochromatosis, hereditary ([place]) 03/22/2018   Hypertension    Past Surgical History:  Procedure Laterality Date   ABDOMINAL HYSTERECTOMY  2001   APPENDECTOMY  1962   breast cysts     BREAST SURGERY  2004, 2005   left breast cyst drained x 2   CHOLECYSTECTOMY  06/2019   COLONOSCOPY  06/08/2006   COLONOSCOPY WITH PROPOFOL N/A 06/29/2016   Procedure: COLONOSCOPY WITH PROPOFOL;  Surgeon: Robert Bellow, MD;  Location: [facility] ENDOSCOPY;  Service: Endoscopy;  Laterality: N/A;   TONSILLECTOMY  1970   Family History  Problem Relation Age of Onset   Cancer Mother 13       Breast Cancer-Mastectomy, Also Abdominal cancer   Asthma Father    Diabetes Father     Allergies: Patient has no known allergies. Current Outpatient Medications on File Prior to Visit  Medication Sig Dispense Refill   calcium carbonate (TUMS - DOSED IN MG ELEMENTAL CALCIUM) 500 MG chewable tablet Chew 1 tablet by mouth daily.     hydrochlorothiazide (HYDRODIURIL) 25 MG tablet TAKE 1 TABLET BY MOUTH EVERY DAY 90 tablet 0   telmisartan (MICARDIS)  20 MG tablet TAKE 1 TABLET BY MOUTH EVERY DAY 90 tablet 1   No current facility-administered medications on file prior to visit.    Social History   Tobacco Use   Smoking status: Never   Smokeless tobacco: Never  Vaping Use   Vaping Use: Never used  Substance Use Topics   Alcohol use: No   Drug use: No    Review of Systems  Constitutional:  Negative for chills and fever.  Respiratory:  Negative for cough.   Cardiovascular:  Negative for chest pain and palpitations.  Gastrointestinal:  Negative for nausea and vomiting.  Genitourinary:  Negative for dysuria.  Musculoskeletal:  Positive for back pain.  Skin:  Negative for rash.  Neurological:  Positive for numbness.     Objective:    BP 120/78    Pulse 80    Temp 98.2 F (36.8 C) (Oral)    Ht 5\' 5"  (1.651 m)    Wt 187 lb 3.2 oz (84.9 kg)    SpO2 97%    BMI 31.15 kg/m    Physical Exam Vitals reviewed.  Constitutional:      Appearance: She is well-developed.  Eyes:     Conjunctiva/sclera: Conjunctivae normal.  Cardiovascular:     Rate and Rhythm: Normal rate and regular rhythm.     Pulses: Normal pulses.  Heart sounds: Normal heart sounds.  Pulmonary:     Effort: Pulmonary effort is normal.     Breath sounds: Normal breath sounds. No wheezing, rhonchi or rales.  Musculoskeletal:     Lumbar back: No swelling, edema, spasms, tenderness or bony tenderness. Normal range of motion.     Right lower leg: No edema.     Left lower leg: No edema.     Comments: Full range of motion with flexion, tension, lateral side bends. No bony tenderness. No pain, numbness, tingling elicited with single leg raise bilaterally.  No tenderness over SI joint with palpation Right Hip: No limp or waddling gait. Full ROM with flexion and hip rotation in flexion.    No pain of lateral hip with  (flexion-abduction-external rotation) test.   No pain with deep palpation of greater trochanter.     Skin:    General: Skin is warm and dry.   Neurological:     Mental Status: She is alert.     Sensory: No sensory deficit.     Deep Tendon Reflexes:     Reflex Scores:      Patellar reflexes are 2+ on the right side and 2+ on the left side.    Comments: Sensation and strength intact bilateral lower extremities.  Psychiatric:        Speech: Speech normal.        Behavior: Behavior normal.        Thought Content: Thought content normal.       Assessment & Plan:   Problem List Items Addressed This Visit       Other   Right leg pain - Primary    Patient well appearing and unable to elicit pain on exam. Presentation consistent with SI joint dysfunction associated with radicular symptoms. Anticipate DDD.  Pending lumbar and right hip XR. Trial of mobic 7.5mg  prn and if no resolution, consider lumbar MRI, PT, and consult with orthopedics. She will let me know how she is doing      Relevant Medications   meloxicam (MOBIC) 7.5 MG tablet   Other Relevant Orders   DG Lumbar Spine Complete   DG Hip Unilat W OR W/O Pelvis 2-3 Views Right      I am having Rhonda Brock start on meloxicam. I am also having her maintain her calcium carbonate, telmisartan, and hydrochlorothiazide.   Meds ordered this encounter  Medications   meloxicam (MOBIC) 7.5 MG tablet    Sig: Take 1 tablet (7.5 mg total) by mouth daily as needed for pain.    Dispense:  30 tablet    Refill:  1    Order Specific Question:   Supervising Provider    Answer:   Crecencio Mc [[id number]]    Return precautions given.   Risks, benefits, and alternatives of the medications and treatment plan prescribed today were discussed, and patient expressed understanding.   Education regarding symptom management and diagnosis given to patient on AVS.  Continue to follow with Crecencio Mc, MD for routine health maintenance.   Rhonda Brock and I agreed with plan.   Mable Paris, FNP

## 2021-03-26 NOTE — Patient Instructions (Signed)
I suspect your pain is related to arthritic changes in your lumbar spine or sacroiliac joint dysfunction.  We will start with anti-inflammatory meloxicam.  Please let me know how you are doing and if any worsening of pain or pain does not resolve  A couple of points in regards to meloxicam ( Mobic) -  This medication is not intended for daily , long term use. It is a potent anti inflammatory ( NSAID), and my intention is for you take as needed for moderate to severe pain. If you find yourself using daily, please let me know.   Please takes Mobic ( meloxicam) with FOOD since it is an anti-inflammatory as it can cause a GI bleed or ulcer. If you have a history of GI bleed or ulcer, please do NOT take.  Do no take over the counter aleve, motrin, advil, goody's powder for pain as they are also NSAIDs, and they are  in the same class as Mobic  Lastly, we will need to monitor kidney function while on Mobic, and if we were to see any decline in kidney function in the future, we would have to discontinue this medication.   Nice to see you!

## 2021-03-26 NOTE — Assessment & Plan Note (Signed)
Patient well appearing and unable to elicit pain on exam. Presentation consistent with SI joint dysfunction associated with radicular symptoms. Anticipate DDD.  Pending lumbar and right hip XR. Trial of mobic 7.5mg  prn and if no resolution, consider lumbar MRI, PT, and consult with orthopedics. She will let me know how she is doing

## 2021-03-30 ENCOUNTER — Encounter: Payer: Self-pay | Admitting: Family

## 2021-03-31 ENCOUNTER — Encounter: Payer: Self-pay | Admitting: Family

## 2021-04-09 ENCOUNTER — Other Ambulatory Visit: Payer: Self-pay

## 2021-04-09 DIAGNOSIS — M533 Sacrococcygeal disorders, not elsewhere classified: Secondary | ICD-10-CM

## 2021-04-20 DIAGNOSIS — M533 Sacrococcygeal disorders, not elsewhere classified: Secondary | ICD-10-CM | POA: Diagnosis not present

## 2021-04-20 DIAGNOSIS — M25551 Pain in right hip: Secondary | ICD-10-CM | POA: Diagnosis not present

## 2021-04-20 DIAGNOSIS — M6281 Muscle weakness (generalized): Secondary | ICD-10-CM | POA: Diagnosis not present

## 2021-04-20 DIAGNOSIS — M79651 Pain in right thigh: Secondary | ICD-10-CM | POA: Diagnosis not present

## 2021-04-21 DIAGNOSIS — H43812 Vitreous degeneration, left eye: Secondary | ICD-10-CM | POA: Diagnosis not present

## 2021-04-21 DIAGNOSIS — H25093 Other age-related incipient cataract, bilateral: Secondary | ICD-10-CM | POA: Diagnosis not present

## 2021-04-22 DIAGNOSIS — M6281 Muscle weakness (generalized): Secondary | ICD-10-CM | POA: Diagnosis not present

## 2021-04-22 DIAGNOSIS — M533 Sacrococcygeal disorders, not elsewhere classified: Secondary | ICD-10-CM | POA: Diagnosis not present

## 2021-04-22 DIAGNOSIS — M79651 Pain in right thigh: Secondary | ICD-10-CM | POA: Diagnosis not present

## 2021-04-22 DIAGNOSIS — M25551 Pain in right hip: Secondary | ICD-10-CM | POA: Diagnosis not present

## 2021-04-27 DIAGNOSIS — M79651 Pain in right thigh: Secondary | ICD-10-CM | POA: Diagnosis not present

## 2021-04-27 DIAGNOSIS — M25551 Pain in right hip: Secondary | ICD-10-CM | POA: Diagnosis not present

## 2021-04-27 DIAGNOSIS — M6281 Muscle weakness (generalized): Secondary | ICD-10-CM | POA: Diagnosis not present

## 2021-04-27 DIAGNOSIS — M533 Sacrococcygeal disorders, not elsewhere classified: Secondary | ICD-10-CM | POA: Diagnosis not present

## 2021-04-29 DIAGNOSIS — M6281 Muscle weakness (generalized): Secondary | ICD-10-CM | POA: Diagnosis not present

## 2021-04-29 DIAGNOSIS — M25551 Pain in right hip: Secondary | ICD-10-CM | POA: Diagnosis not present

## 2021-04-29 DIAGNOSIS — M533 Sacrococcygeal disorders, not elsewhere classified: Secondary | ICD-10-CM | POA: Diagnosis not present

## 2021-04-29 DIAGNOSIS — M79651 Pain in right thigh: Secondary | ICD-10-CM | POA: Diagnosis not present

## 2021-06-01 ENCOUNTER — Other Ambulatory Visit: Payer: Self-pay | Admitting: Internal Medicine

## 2021-06-14 ENCOUNTER — Telehealth: Payer: Self-pay | Admitting: Internal Medicine

## 2021-06-14 NOTE — Telephone Encounter (Signed)
Copied from [place] [id number]. Topic: Medicare AWV ?>> Jun 14, 2021 10:16 AM Harris-Coley, Hannah Beat wrote: ?Reason for CRM: Left message for patient to schedule Annual Wellness Visit.  Please schedule with Nurse Health Advisor Denisa O'Brien-Blaney, LPN at [facility].  Please call [phone] ask for Juliann Pulse ?

## 2021-06-15 DIAGNOSIS — H25093 Other age-related incipient cataract, bilateral: Secondary | ICD-10-CM | POA: Diagnosis not present

## 2021-06-15 DIAGNOSIS — H43812 Vitreous degeneration, left eye: Secondary | ICD-10-CM | POA: Diagnosis not present

## 2021-06-16 ENCOUNTER — Telehealth: Payer: Self-pay | Admitting: Internal Medicine

## 2021-06-16 ENCOUNTER — Ambulatory Visit: Payer: Medicare Other

## 2021-06-16 DIAGNOSIS — L668 Other cicatricial alopecia: Secondary | ICD-10-CM | POA: Diagnosis not present

## 2021-06-16 DIAGNOSIS — L821 Other seborrheic keratosis: Secondary | ICD-10-CM | POA: Diagnosis not present

## 2021-06-16 NOTE — Telephone Encounter (Signed)
Copied from [place] [id number]. Topic: Medicare AWV ?>> Jun 16, 2021  8:53 AM Harris-Coley, Hannah Beat wrote: ?Reason for CRM: LVM 06/16/21 at 8:51am AWV r/s to 06/23/21 '@10'$ :15am.  Please confirm appt change khc ?

## 2021-06-23 ENCOUNTER — Ambulatory Visit (INDEPENDENT_AMBULATORY_CARE_PROVIDER_SITE_OTHER): Payer: Medicare Other

## 2021-06-23 VITALS — Ht 65.0 in | Wt 183.0 lb

## 2021-06-23 DIAGNOSIS — Z0000 Encounter for general adult medical examination without abnormal findings: Secondary | ICD-10-CM | POA: Diagnosis not present

## 2021-06-23 NOTE — Progress Notes (Addendum)
Subjective:   Rhonda Brock is a 66 y.o. female who presents for an Initial Medicare Annual Wellness Visit.  Review of Systems    No ROS.  Medicare Wellness Virtual Visit.  Visual/audio telehealth visit, UTA vital signs.   See social history for additional risk factors.   Cardiac Risk Factors include: advanced age (>35men, >55 women)     Objective:    Today's Vitals   06/23/21 1020  Weight: 183 lb (83 kg)  Height: 5\' 5"  (1.651 m)   Body mass index is 30.45 kg/m.     06/23/2021   10:28 AM 01/26/2021    1:04 PM 07/15/2020    1:04 PM 06/28/2019    8:18 AM 06/24/2019    8:28 AM 06/09/2019    4:24 AM 04/04/2019    9:58 AM  Advanced Directives  Does Patient Have a Medical Advance Directive? Yes Yes Yes Yes Yes No Yes  Type of Estate agent of [place];Living will Healthcare Power of [place];Living will Living will;Healthcare Power of Attorney Living will Living will  Healthcare Power of [place];Living will  Does patient want to make changes to medical advance directive? No - Patient declined No - Patient declined     No - Patient declined  Copy of Healthcare Power of Attorney in Chart? Yes - validated most recent copy scanned in chart (See row information)      No - copy requested  Would patient like information on creating a medical advance directive?    No - Patient declined  No - Patient declined No - Patient declined    Current Medications (verified) Outpatient Encounter Medications as of 06/23/2021  Medication Sig   calcium carbonate (TUMS - DOSED IN MG ELEMENTAL CALCIUM) 500 MG chewable tablet Chew 1 tablet by mouth daily.   hydrochlorothiazide (HYDRODIURIL) 25 MG tablet TAKE 1 TABLET BY MOUTH EVERY DAY   meloxicam (MOBIC) 7.5 MG tablet Take 1 tablet (7.5 mg total) by mouth daily as needed for pain.   telmisartan (MICARDIS) 20 MG tablet TAKE 1 TABLET BY MOUTH EVERY DAY   No facility-administered encounter medications on file as of 06/23/2021.     Allergies (verified) Patient has no known allergies.   History: Past Medical History:  Diagnosis Date   Anxiety    Back pain    Chronic headaches    Cyst (solitary) of breast    bilaterally   GERD (gastroesophageal reflux disease)    Hemochromatosis, hereditary ([facility]) 03/22/2018   Hypertension    Past Surgical History:  Procedure Laterality Date   ABDOMINAL HYSTERECTOMY  2001   APPENDECTOMY  1962   breast cysts     BREAST SURGERY  2004, 2005   left breast cyst drained x 2   CHOLECYSTECTOMY  06/2019   COLONOSCOPY  06/08/2006   COLONOSCOPY WITH PROPOFOL N/A 06/29/2016   Procedure: COLONOSCOPY WITH PROPOFOL;  Surgeon: Earline Mayotte, MD;  Location: [facility] ENDOSCOPY;  Service: Endoscopy;  Laterality: N/A;   TONSILLECTOMY  1970   Family History  Problem Relation Age of Onset   Cancer Mother 9       Breast Cancer-Mastectomy, Also Abdominal cancer   Asthma Father    Diabetes Father    Social History   Socioeconomic History   Marital status: Married    Spouse name: Not on file   Number of children: Not on file   Years of education: 14   Highest education level: Not on file  Occupational History   Occupation: Futures trader  Tobacco Use   Smoking status: Never   Smokeless tobacco: Never  Vaping Use   Vaping Use: Never used  Substance and Sexual Activity   Alcohol use: No   Drug use: No   Sexual activity: Not on file  Other Topics Concern   Not on file  Social History Narrative   Regular exercise-yes   Caffeine use-yes         Social Determinants of Health   Financial Resource Strain: Low Risk    Difficulty of Paying Living Expenses: Not hard at all  Food Insecurity: No Food Insecurity   Worried About Programme researcher, broadcasting/film/video in the Last Year: Never true   Ran Out of Food in the Last Year: Never true  Transportation Needs: No Transportation Needs   Lack of Transportation (Medical): No   Lack of Transportation (Non-Medical): No  Physical Activity: Insufficiently  Active   Days of Exercise per Week: 3 days   Minutes of Exercise per Session: 20 min  Stress: No Stress Concern Present   Feeling of Stress : Only a little  Social Connections: Unknown   Frequency of Communication with Friends and Family: More than three times a week   Frequency of Social Gatherings with Friends and Family: More than three times a week   Attends Religious Services: Not on Scientist, clinical (histocompatibility and immunogenetics) or Organizations: Not on file   Attends Banker Meetings: Not on file   Marital Status: Married   Tobacco Counseling Counseling given: Not Answered  Clinical Intake: Pre-visit preparation completed: Yes          How often do you need to have someone help you when you read instructions, pamphlets, or other written materials from your doctor or pharmacy?: 1 - Never   Interpreter Needed?: No    Activities of Daily Living    06/23/2021   10:21 AM  In your present state of health, do you have any difficulty performing the following activities:  Hearing? 0  Vision? 0  Difficulty concentrating or making decisions? 0  Walking or climbing stairs? 0  Dressing or bathing? 0  Doing errands, shopping? 0  Preparing Food and eating ? N  Using the Toilet? N  In the past six months, have you accidently leaked urine? N  Do you have problems with loss of bowel control? N  Managing your Medications? N  Managing your Finances? N  Housekeeping or managing your Housekeeping? N   Patient Care Team: Sherlene Shams, MD as PCP - General (Internal Medicine) Sherlene Shams, MD (Internal Medicine) Lemar Livings Merrily Pew, MD (General Surgery) Rickard Patience, MD as Consulting Physician (Hematology and Oncology)  Indicate any recent Medical Services you may have received from other than [facility] providers in the past year (date may be approximate).     Assessment:   This is a routine wellness examination for [name].  Virtual Visit via Telephone Note  I connected with  Rhonda Brock on 06/23/21 at 10:15 AM EDT by telephone and verified that I am speaking with the correct person using two identifiers.  Persons participating in the virtual visit: patient/Nurse Health Advisor   I discussed the limitations of performing an evaluation and management service by telehealth. The patient expressed understanding and agreed to proceed. We continued and completed visit with audio only. Some vital signs may be absent or patient reported.   Hearing/Vision screen Hearing Screening - Comments:: Patient is able to hear conversational tones without difficulty.  No issues reported. Vision Screening - Comments:: Wears corrective lenses They have seen their ophthalmologist in the last 12 months.   Dietary issues and exercise activities discussed: Current Exercise Habits: Home exercise routine, Type of exercise: walking, Time (Minutes): 20, Frequency (Times/Week): 3, Weekly Exercise (Minutes/Week): 60, Intensity: Mild Healthy diet Good water intake   Goals Addressed             This Visit's Progress    Maintain Healthy Lifestyle         Depression Screen    06/23/2021   10:26 AM 06/17/2020    2:08 PM 05/09/2016    8:48 AM  PHQ 2/9 Scores  PHQ - 2 Score 0 0 0    Fall Risk    06/23/2021   10:28 AM 06/17/2020    2:01 PM 05/09/2016    8:48 AM  Fall Risk   Falls in the past year? 0 0 No  Number falls in past yr: 0    Follow up Falls evaluation completed Falls evaluation completed     FALL RISK PREVENTION PERTAINING TO THE HOME: Home free of loose throw rugs in walkways, pet beds, electrical cords, etc? Yes  Adequate lighting in your home to reduce risk of falls? Yes   ASSISTIVE DEVICES UTILIZED TO PREVENT FALLS: Use of a cane, walker or w/c? No   TIMED UP AND GO: Was the test performed? No .   Cognitive Function:  Patient is alert and oriented x3.  Normal cognitive status assessed by direct observation/communcation. No abnormalities found.        Immunizations Immunization History  Administered Date(s) Administered   Hepatitis B, adult 05/17/2018, 06/19/2018, 11/16/2018   Influenza,inj,Quad PF,6+ Mos 01/11/2018, 12/31/2018, 01/03/2020   Influenza-Unspecified 12/29/2013, 01/15/2015   MMR 09/25/2000   Moderna Sars-Covid-2 Vaccination 04/12/2019, 05/10/2019, 02/11/2020   Tdap 03/29/2011   TDAP status: Due, Education has been provided regarding the importance of this vaccine. Advised may receive this vaccine at local pharmacy or Health Dept. Aware to provide a copy of the vaccination record if obtained from local pharmacy or Health Dept. Verbalized acceptance and understanding. Deferred.   Pneumococcal vaccine status: Due, Education has been provided regarding the importance of this vaccine. Advised may receive this vaccine at local pharmacy or Health Dept. Aware to provide a copy of the vaccination record if obtained from local pharmacy or Health Dept. Verbalized acceptance and understanding. Deferred.  Shingrix Completed?: No.    Education has been provided regarding the importance of this vaccine. Patient has been advised to call insurance company to determine out of pocket expense if they have not yet received this vaccine. Advised may also receive vaccine at local pharmacy or Health Dept. Verbalized acceptance and understanding.  Screening Tests Health Maintenance  Topic Date Due   INFLUENZA VACCINE  06/25/2021 (Originally 10/26/2020)   COVID-19 Vaccine (4 - Booster for Moderna series) 07/09/2021 (Originally 04/07/2020)   Zoster Vaccines- Shingrix (1 of 2) 09/23/2021 (Originally 05/18/2005)   Pneumonia Vaccine 15+ Years old (1 - PCV) 06/24/2022 (Originally 05/18/2020)   TETANUS/TDAP  06/24/2022 (Originally 03/28/2021)   MAMMOGRAM  01/26/2022   COLONOSCOPY (Pts 45-55yrs Insurance coverage will need to be confirmed)  06/30/2026   DEXA SCAN  Completed   Hepatitis C Screening  Completed   HPV VACCINES  Aged Out   Health  Maintenance There are no preventive care reminders to display for this patient.  Lung Cancer Screening: (Low Dose CT Chest recommended if Age 75-80 years, 30 pack-year currently smoking  OR have quit w/in 15years.) does not qualify.   Vision Screening: Recommended annual ophthalmology exams for early detection of glaucoma and other disorders of the eye.  Dental Screening: Recommended annual dental exams for proper oral hygiene  Community Resource Referral / Chronic Care Management: CRR required this visit?  No   CCM required this visit?  No      Plan:   Keep all routine maintenance appointments.   I have personally reviewed and noted the following in the patient's chart:   Medical and social history Use of alcohol, tobacco or illicit drugs  Current medications and supplements including opioid prescriptions. Patient is not currently taking opioid prescriptions. Functional ability and status Nutritional status Physical activity Advanced directives List of other physicians Hospitalizations, surgeries, and ER visits in previous 12 months Vitals Screenings to include cognitive, depression, and falls Referrals and appointments  In addition, I have reviewed and discussed with patient certain preventive protocols, quality metrics, and best practice recommendations. A written personalized care plan for preventive services as well as general preventive health recommendations were provided to patient via mychart.     OBrien-Blaney, Evo Aderman L, LPN   1/61/0960    I have reviewed the above information and agree with above.   Duncan Dull, MD

## 2021-06-23 NOTE — Patient Instructions (Addendum)
?  Rhonda Brock , ?Thank you for taking time to come for your Medicare Wellness Visit. I appreciate your ongoing commitment to your health goals. Please review the following plan we discussed and let me know if I can assist you in the future.  ? ?These are the goals we discussed: ? Goals   ? ?  Maintain Healthy Lifestyle   ? ?  ?  ?This is a list of the screening recommended for you and due dates:  ?Health Maintenance  ?Topic Date Due  ? Flu Shot  06/25/2021*  ? COVID-19 Vaccine (4 - Booster for Moderna series) 07/09/2021*  ? Zoster (Shingles) Vaccine (1 of 2) 09/23/2021*  ? Pneumonia Vaccine (1 - PCV) 06/24/2022*  ? Tetanus Vaccine  06/24/2022*  ? Mammogram  01/26/2022  ? Colon Cancer Screening  06/30/2026  ? DEXA scan (bone density measurement)  Completed  ? Hepatitis C Screening: USPSTF Recommendation to screen - Ages 82-79 yo.  Completed  ? HPV Vaccine  Aged Out  ?*Topic was postponed. The date shown is not the original due date.  ?  ?

## 2021-07-23 ENCOUNTER — Other Ambulatory Visit: Payer: Self-pay

## 2021-07-26 ENCOUNTER — Inpatient Hospital Stay: Payer: Medicare Other | Attending: Oncology

## 2021-07-26 DIAGNOSIS — I10 Essential (primary) hypertension: Secondary | ICD-10-CM | POA: Insufficient documentation

## 2021-07-26 DIAGNOSIS — Z803 Family history of malignant neoplasm of breast: Secondary | ICD-10-CM | POA: Insufficient documentation

## 2021-07-26 DIAGNOSIS — Z90710 Acquired absence of both cervix and uterus: Secondary | ICD-10-CM | POA: Diagnosis not present

## 2021-07-26 LAB — CBC WITH DIFFERENTIAL/PLATELET
Abs Immature Granulocytes: 0.06 10*3/uL (ref 0.00–0.07)
Basophils Absolute: 0.1 10*3/uL (ref 0.0–0.1)
Basophils Relative: 1 %
Eosinophils Absolute: 0.2 10*3/uL (ref 0.0–0.5)
Eosinophils Relative: 2 %
HCT: 41.2 % (ref 36.0–46.0)
Hemoglobin: 14.3 g/dL (ref 12.0–15.0)
Immature Granulocytes: 1 %
Lymphocytes Relative: 25 %
Lymphs Abs: 2.4 10*3/uL (ref 0.7–4.0)
MCH: 33.1 pg (ref 26.0–34.0)
MCHC: 34.7 g/dL (ref 30.0–36.0)
MCV: 95.4 fL (ref 80.0–100.0)
Monocytes Absolute: 0.7 10*3/uL (ref 0.1–1.0)
Monocytes Relative: 8 %
Neutro Abs: 6 10*3/uL (ref 1.7–7.7)
Neutrophils Relative %: 63 %
Platelets: 299 10*3/uL (ref 150–400)
RBC: 4.32 MIL/uL (ref 3.87–5.11)
RDW: 12.6 % (ref 11.5–15.5)
WBC: 9.5 10*3/uL (ref 4.0–10.5)
nRBC: 0 % (ref 0.0–0.2)

## 2021-07-26 LAB — HEPATIC FUNCTION PANEL
ALT: 16 U/L (ref 0–44)
AST: 22 U/L (ref 15–41)
Albumin: 3.7 g/dL (ref 3.5–5.0)
Alkaline Phosphatase: 60 U/L (ref 38–126)
Bilirubin, Direct: 0.1 mg/dL (ref 0.0–0.2)
Indirect Bilirubin: 0.5 mg/dL (ref 0.3–0.9)
Total Bilirubin: 0.6 mg/dL (ref 0.3–1.2)
Total Protein: 6.7 g/dL (ref 6.5–8.1)

## 2021-07-26 LAB — IRON AND TIBC
Iron: 132 ug/dL (ref 28–170)
Saturation Ratios: 46 % — ABNORMAL HIGH (ref 10.4–31.8)
TIBC: 284 ug/dL (ref 250–450)
UIBC: 152 ug/dL

## 2021-07-26 LAB — FERRITIN: Ferritin: 37 ng/mL (ref 11–307)

## 2021-07-28 ENCOUNTER — Inpatient Hospital Stay: Payer: Medicare Other | Admitting: Oncology

## 2021-07-28 ENCOUNTER — Encounter: Payer: Self-pay | Admitting: Oncology

## 2021-07-28 DIAGNOSIS — Z803 Family history of malignant neoplasm of breast: Secondary | ICD-10-CM | POA: Diagnosis not present

## 2021-07-28 DIAGNOSIS — I10 Essential (primary) hypertension: Secondary | ICD-10-CM | POA: Diagnosis not present

## 2021-07-28 NOTE — Progress Notes (Signed)
?Hematology/Oncology Progress note ?[phone] [phone] ?  ? ? ? ?Patient Care Team: ?Crecencio Mc, MD as PCP - General (Internal Medicine) ?Crecencio Mc, MD (Internal Medicine) ?Robert Bellow, MD (General Surgery) ?Earlie Server, MD as Consulting Physician (Hematology and Oncology) ? ?REFERRING PROVIDER: ?Crecencio Mc, MD  ?CHIEF COMPLAINTS/REASON FOR VISIT:  ?Patient presented to follow-up for management of hemochromatosis and iron overload. ? ?HISTORY OF PRESENTING ILLNESS:  ?Rhonda Brock is a  66 y.o.  female with PMH listed below who was presents for follow up of hereditary hemochromatosis, homozygous C282Y mutation. ?#   She has followed up with gastroenterologist and has received hepatitis vaccination.  Was recommended no need for liver biopsy  ? ?INTERVAL HISTORY ?Rhonda Brock is a 66 y.o. female who has above history reviewed by me today presents for follow up visit for management of hereditary hemochromatosis with iron overload. ?Patient reports feeling well.  She has no new complaints. ?Patient has started to donate blood to [employer].  She donated in March 2023. ?. ?Review of Systems  ?Constitutional:  Negative for appetite change, chills, fatigue and fever.  ?HENT:   Negative for hearing loss and voice change.   ?Eyes:  Negative for eye problems.  ?Respiratory:  Negative for chest tightness and cough.   ?Cardiovascular:  Negative for chest pain.  ?Gastrointestinal:  Negative for abdominal distention, abdominal pain and blood in stool.  ?Endocrine: Negative for hot flashes.  ?Genitourinary:  Negative for difficulty urinating and frequency.   ?Musculoskeletal:  Negative for arthralgias.  ?Skin:  Negative for itching and rash.  ?Neurological:  Negative for extremity weakness.  ?Hematological:  Negative for adenopathy.  ?Psychiatric/Behavioral:  Negative for confusion.   ? ?MEDICAL HISTORY:  ?Past Medical History:  ?Diagnosis Date  ? Anxiety   ? Back pain   ? Chronic  headaches   ? Cyst (solitary) of breast   ? bilaterally  ? GERD (gastroesophageal reflux disease)   ? Hemochromatosis, hereditary ([place]) 03/22/2018  ? Hypertension   ? ? ?SURGICAL HISTORY: ?Past Surgical History:  ?Procedure Laterality Date  ? ABDOMINAL HYSTERECTOMY  2001  ? APPENDECTOMY  1962  ? breast cysts    ? BREAST SURGERY  2004, 2005  ? left breast cyst drained x 2  ? CHOLECYSTECTOMY  06/2019  ? COLONOSCOPY  06/08/2006  ? COLONOSCOPY WITH PROPOFOL N/A 06/29/2016  ? Procedure: COLONOSCOPY WITH PROPOFOL;  Surgeon: Robert Bellow, MD;  Location: [facility] ENDOSCOPY;  Service: Endoscopy;  Laterality: N/A;  ? TONSILLECTOMY  1970  ? ? ?SOCIAL HISTORY: ?Social History  ? ?Socioeconomic History  ? Marital status: Married  ?  Spouse name: Not on file  ? Number of children: Not on file  ? Years of education: 47  ? Highest education level: Not on file  ?Occupational History  ? Occupation: Homemaker  ?Tobacco Use  ? Smoking status: Never  ? Smokeless tobacco: Never  ?Vaping Use  ? Vaping Use: Never used  ?Substance and Sexual Activity  ? Alcohol use: No  ? Drug use: No  ? Sexual activity: Not on file  ?Other Topics Concern  ? Not on file  ?Social History Narrative  ? Regular exercise-yes  ? Caffeine use-yes  ?   ?   ? ?Social Determinants of Health  ? ?Financial Resource Strain: Low Risk   ? Difficulty of Paying Living Expenses: Not hard at all  ?Food Insecurity: No Food Insecurity  ? Worried About Charity fundraiser in the  Last Year: Never true  ? Ran Out of Food in the Last Year: Never true  ?Transportation Needs: No Transportation Needs  ? Lack of Transportation (Medical): No  ? Lack of Transportation (Non-Medical): No  ?Physical Activity: Insufficiently Active  ? Days of Exercise per Week: 3 days  ? Minutes of Exercise per Session: 20 min  ?Stress: No Stress Concern Present  ? Feeling of Stress : Only a little  ?Social Connections: Unknown  ? Frequency of Communication with Friends and Family: More than three times a week   ? Frequency of Social Gatherings with Friends and Family: More than three times a week  ? Attends Religious Services: Not on file  ? Active Member of Clubs or Organizations: Not on file  ? Attends Archivist Meetings: Not on file  ? Marital Status: Married  ?Intimate Partner Violence: Not At Risk  ? Fear of Current or Ex-Partner: No  ? Emotionally Abused: No  ? Physically Abused: No  ? Sexually Abused: No  ? ? ?FAMILY HISTORY: ?Family History  ?Problem Relation Age of Onset  ? Cancer Mother 74  ?     Breast Cancer-Mastectomy, Also Abdominal cancer  ? Asthma Father   ? Diabetes Father   ? ? ?ALLERGIES:  has No Known Allergies. ? ?MEDICATIONS:  ?Current Outpatient Medications  ?Medication Sig Dispense Refill  ? calcium carbonate (TUMS - DOSED IN MG ELEMENTAL CALCIUM) 500 MG chewable tablet Chew 1 tablet by mouth daily.    ? hydrochlorothiazide (HYDRODIURIL) 25 MG tablet TAKE 1 TABLET BY MOUTH EVERY DAY 90 tablet 0  ? telmisartan (MICARDIS) 20 MG tablet TAKE 1 TABLET BY MOUTH EVERY DAY 90 tablet 1  ? finasteride (PROSCAR) 5 MG tablet Take 2.5 mg by mouth daily.    ? meloxicam (MOBIC) 7.5 MG tablet Take 1 tablet (7.5 mg total) by mouth daily as needed for pain. 30 tablet 1  ? ?No current facility-administered medications for this visit.  ? ? ? ?PHYSICAL EXAMINATION: ?ECOG PERFORMANCE STATUS: 0 - Asymptomatic ?Vitals:  ? 07/28/21 1312  ?BP: 115/82  ?Pulse: 84  ?Temp: 98.6 ?F (37 ?C)  ? ?Filed Weights  ? 07/28/21 1312  ?Weight: 189 lb (85.7 kg)  ? ? ?Physical Exam ?Constitutional:   ?   General: She is not in acute distress. ?HENT:  ?   Head: Normocephalic and atraumatic.  ?Eyes:  ?   General: No scleral icterus. ?   Pupils: Pupils are equal, round, and reactive to light.  ?Cardiovascular:  ?   Rate and Rhythm: Normal rate and regular rhythm.  ?   Heart sounds: Normal heart sounds.  ?Pulmonary:  ?   Effort: Pulmonary effort is normal. No respiratory distress.  ?   Breath sounds: No wheezing.  ?Abdominal:  ?    General: Bowel sounds are normal. There is no distension.  ?   Palpations: Abdomen is soft. There is no mass.  ?   Tenderness: There is no abdominal tenderness.  ?Musculoskeletal:     ?   General: No deformity. Normal range of motion.  ?   Cervical back: Normal range of motion and neck supple.  ?Skin: ?   General: Skin is warm and dry.  ?   Findings: No erythema or rash.  ?Neurological:  ?   Mental Status: She is alert and oriented to person, place, and time. Mental status is at baseline.  ?   Cranial Nerves: No cranial nerve deficit.  ?   Coordination: Coordination  normal.  ?Psychiatric:     ?   Mood and Affect: Mood normal.  ? ? ? ?LABORATORY DATA:  ?I have reviewed the data as listed ?Lab Results  ?Component Value Date  ? WBC 9.5 07/26/2021  ? HGB 14.3 07/26/2021  ? HCT 41.2 07/26/2021  ? MCV 95.4 07/26/2021  ? PLT 299 07/26/2021  ? ?Recent Labs  ?  01/13/21 ?1329 07/26/21 ?1320  ?PROT 6.8 6.7  ?ALBUMIN 4.0 3.7  ?AST 21 22  ?ALT 18 16  ?ALKPHOS 65 60  ?BILITOT 0.6 0.6  ?BILIDIR 0.2 0.1  ?IBILI 0.4 0.5  ? ? ?Iron/TIBC/Ferritin/ %Sat ?   ?Component Value Date/Time  ? IRON 132 07/26/2021 1320  ? IRON 130 12/16/2011 1459  ? TIBC 284 07/26/2021 1320  ? TIBC 226 (L) 12/16/2011 1459  ? FERRITIN 37 07/26/2021 1320  ? FERRITIN 367 12/16/2011 1459  ? IRONPCTSAT 46 (H) 07/26/2021 1320  ? IRONPCTSAT 66 (H) 02/19/2018 0830  ?  ? ?RADIOGRAPHIC STUDIES: ?I have personally reviewed the radiological images as listed and agreed with the findings in the report. ? 10/06/2017 Mammogram ?Negative, recommend annual screening mammogram.  ? ?ASSESSMENT & PLAN:  ?1. Hemochromatosis, hereditary ([place])   ?2. Iron overload   ?Homozygous hemochromatosis mutation/iron overload ?Labs reviewed and discussed with patient.  LFTs stable. ?Ferritin is less than 50, at goal.  Isolated iron saturation elevation.  Recommend observation for now. ?She may continue to proceed with blood donation every 4 months with blood bank. ?Avoid iron supplementation,  alcohol vitamin C supplementation. ? ? ?All questions were answered. The patient knows to call the clinic with any problems questions or concerns. ? ?Return of visit: She will have blood work done and

## 2021-08-23 ENCOUNTER — Other Ambulatory Visit: Payer: Self-pay | Admitting: Internal Medicine

## 2021-08-24 NOTE — Telephone Encounter (Signed)
LMTCB. Pt is overdue for a follow up with Dr. Derrel Nip. Please schedule when pt calls back.

## 2021-08-25 ENCOUNTER — Ambulatory Visit: Payer: Medicare Other | Admitting: Internal Medicine

## 2021-09-20 ENCOUNTER — Encounter: Payer: Self-pay | Admitting: Internal Medicine

## 2021-09-20 ENCOUNTER — Ambulatory Visit (INDEPENDENT_AMBULATORY_CARE_PROVIDER_SITE_OTHER): Payer: Medicare Other | Admitting: Internal Medicine

## 2021-09-20 VITALS — BP 120/84 | HR 85 | Temp 97.6°F | Ht 65.0 in | Wt 195.4 lb

## 2021-09-20 DIAGNOSIS — E669 Obesity, unspecified: Secondary | ICD-10-CM

## 2021-09-20 DIAGNOSIS — E782 Mixed hyperlipidemia: Secondary | ICD-10-CM

## 2021-09-20 DIAGNOSIS — I10 Essential (primary) hypertension: Secondary | ICD-10-CM | POA: Diagnosis not present

## 2021-09-20 DIAGNOSIS — F411 Generalized anxiety disorder: Secondary | ICD-10-CM | POA: Diagnosis not present

## 2021-09-20 DIAGNOSIS — R002 Palpitations: Secondary | ICD-10-CM | POA: Diagnosis not present

## 2021-09-20 MED ORDER — SERTRALINE HCL 25 MG PO TABS: 25.0000 mg | ORAL_TABLET | Freq: Every day | ORAL | 3 refills | Status: DC

## 2021-09-20 NOTE — Progress Notes (Signed)
Subjective:  Patient ID: Rhonda Brock, female    DOB: April 09, 1955  Age: 66 y.o. MRN: [id number]  CC: The primary encounter diagnosis was Hypertension, essential. Diagnoses of Mixed hyperlipidemia, Obesity (BMI 30-39.9), Anxiety state, and Palpitations were also pertinent to this visit.   HPI Rhonda Brock presents for  Chief Complaint  Patient presents with   Follow-up    Follow up on hypertension   1)  Hypertension: patient checks blood pressure daily  at home.  Readings have been for the most part <120/70 at rest . Patient is following a reduced salt diet most days and is taking medications as prescribed (telmisartan 20 mg and hctz  25 mg daily)  2) alopecia:  taking finasteride prescribed her dermatologist   3) anxiety and sadness:  husband [name] has dementia, progressive,  on last outing he no longer recognized his home, and he is now a permanent resident at [facility] .  She is waking up with occasional palpitations,  and having trouble concentrating on her crossword puzzles,  denies other  cognitive deficits.  Balances checkbook every month.  Saddened by the loss of Stuart's memory of their life together .  Lives alone.    Outpatient Medications Prior to Visit  Medication Sig Dispense Refill   calcium carbonate (TUMS - DOSED IN MG ELEMENTAL CALCIUM) 500 MG chewable tablet Chew 1 tablet by mouth daily.     finasteride (PROSCAR) 5 MG tablet Take 2.5 mg by mouth daily.     hydrochlorothiazide (HYDRODIURIL) 25 MG tablet TAKE 1 TABLET BY MOUTH EVERY DAY 90 tablet 0   telmisartan (MICARDIS) 20 MG tablet TAKE 1 TABLET BY MOUTH EVERY DAY 90 tablet 1   meloxicam (MOBIC) 7.5 MG tablet Take 1 tablet (7.5 mg total) by mouth daily as needed for pain. 30 tablet 1   No facility-administered medications prior to visit.    Review of Systems;  Patient denies headache, fevers, malaise, unintentional weight loss, skin rash, eye pain, sinus congestion and sinus pain, sore throat,  dysphagia,  hemoptysis , cough, dyspnea, wheezing, chest pain, palpitations, orthopnea, edema, abdominal pain, nausea, melena, diarrhea, constipation, flank pain, dysuria, hematuria, urinary  Frequency, nocturia, numbness, tingling, seizures,  Focal weakness, Loss of consciousness,  Tremor, insomnia, depression, anxiety, and suicidal ideation.      Objective:  BP 120/84 (BP Location: Left Arm, Patient Position: Sitting, Cuff Size: Large)   Pulse 85   Temp 97.6 F (36.4 C) (Oral)   Ht 5\' 5"  (1.651 m)   Wt 195 lb 6.4 oz (88.6 kg)   SpO2 96%   BMI 32.52 kg/m   BP Readings from Last 3 Encounters:  09/20/21 120/84  07/28/21 115/82  03/26/21 120/78    Wt Readings from Last 3 Encounters:  09/20/21 195 lb 6.4 oz (88.6 kg)  07/28/21 189 lb (85.7 kg)  06/23/21 183 lb (83 kg)    General appearance: alert, cooperative and appears stated age Ears: normal TM's and external ear canals both ears Throat: lips, mucosa, and tongue normal; teeth and gums normal Neck: no adenopathy, no carotid bruit, supple, symmetrical, trachea midline and thyroid not enlarged, symmetric, no tenderness/mass/nodules Back: symmetric, no curvature. ROM normal. No CVA tenderness. Lungs: clear to auscultation bilaterally Heart: regular rate and rhythm, S1, S2 normal, no murmur, click, rub or gallop Abdomen: soft, non-tender; bowel sounds normal; no masses,  no organomegaly Pulses: 2+ and symmetric Skin: Skin color, texture, turgor normal. No rashes or lesions Lymph nodes: Cervical, supraclavicular, and axillary nodes  normal. Psych: affect sad and quiet,  but  makes good eye contact. No fidgeting,  Smiles easily.  Denies suicidal thoughts    Lab Results  Component Value Date   HGBA1C 5.6 06/11/2020   HGBA1C 5.6 02/19/2018    Lab Results  Component Value Date   CREATININE 0.92 07/08/2020   CREATININE 0.85 06/11/2020   CREATININE 0.98 06/26/2019    Lab Results  Component Value Date   WBC 9.5 07/26/2021    HGB 14.3 07/26/2021   HCT 41.2 07/26/2021   PLT 299 07/26/2021   GLUCOSE 135 (H) 07/08/2020   CHOL 174 06/11/2020   TRIG 125.0 06/11/2020   HDL 56.60 06/11/2020   LDLDIRECT 117.0 02/19/2018   LDLCALC 93 06/11/2020   ALT 16 07/26/2021   AST 22 07/26/2021   NA 139 07/08/2020   K 3.5 07/08/2020   CL 102 07/08/2020   CREATININE 0.92 07/08/2020   BUN 17 07/08/2020   CO2 30 07/08/2020   TSH 1.390 03/16/2020   HGBA1C 5.6 06/11/2020   MICROALBUR 0.9 06/17/2020    No results found.  Assessment & Plan:   Problem List Items Addressed This Visit     Obesity (BMI 30-39.9)    I have addressed  BMI and recommended wt loss of 10% of body weight over the next 6 months using a low glycemic index diet and regular exercise a minimum of 5 days per week.        Mixed hyperlipidemia     Normalized triglycerides and normal LDL and HDL. Marland Kitchen   No medications indicated  Unless repeat panel indicates a risk > 8 %    Lab Results  Component Value Date   CHOL 174 06/11/2020   HDL 56.60 06/11/2020   LDLCALC 93 06/11/2020   LDLDIRECT 117.0 02/19/2018   TRIG 125.0 06/11/2020   CHOLHDL 3 06/11/2020          Relevant Orders   Lipid panel   TSH   Anxiety state    She is waking up with palpitations and having decreased concentration due to worrying bout her husband [name] .  Trial of sertraline starting at 25 mg daily with titration to 50 mg after one week if tolerated       Relevant Medications   sertraline (ZOLOFT) 25 MG tablet   Hypertension, essential - Primary    Readings have dropped to 94 systolic in the am.  Will suspend the telmisartan and continue hctz and follow readings       Relevant Orders   Comprehensive metabolic panel   Urine Microalbumin w/creat. ratio   Palpitations    Reviewed the ddx which includes OSA induced atrial fibrillation and anxiety.  Not occurring every morning.  Recommending cardiac monitor ,  She has deferred for now        I spent a total of  30  minutes with this patient in a face to face visit on the date of this encounter reviewing the last office visit with me ,  last visit with hematology , patient's interactions with husband,  home blood pressure readings ,  patient's mood and concetration issues,    and post visit ordering of testing and therapeutics.    Follow-up: Return in about 4 weeks (around 10/18/2021).   Sherlene Shams, MD

## 2021-09-20 NOTE — Assessment & Plan Note (Signed)
Normalized triglycerides and normal LDL and HDL. Marland Kitchen   No medications indicated  Unless repeat panel indicates a risk > 8 %    Lab Results  Component Value Date   CHOL 174 06/11/2020   HDL 56.60 06/11/2020   LDLCALC 93 06/11/2020   LDLDIRECT 117.0 02/19/2018   TRIG 125.0 06/11/2020   CHOLHDL 3 06/11/2020

## 2021-09-21 LAB — COMPREHENSIVE METABOLIC PANEL
ALT: 15 U/L (ref 0–35)
AST: 18 U/L (ref 0–37)
Albumin: 4.1 g/dL (ref 3.5–5.2)
Alkaline Phosphatase: 63 U/L (ref 39–117)
BUN: 13 mg/dL (ref 6–23)
CO2: 30 mEq/L (ref 19–32)
Calcium: 9.3 mg/dL (ref 8.4–10.5)
Chloride: 100 mEq/L (ref 96–112)
Creatinine, Ser: 0.9 mg/dL (ref 0.40–1.20)
GFR: 66.7 mL/min (ref >60.00)
Glucose, Bld: 89 mg/dL (ref 70–99)
Potassium: 3.7 mEq/L (ref 3.5–5.1)
Sodium: 138 mEq/L (ref 135–145)
Total Bilirubin: 0.6 mg/dL (ref 0.2–1.2)
Total Protein: 6.4 g/dL (ref 6.0–8.3)

## 2021-09-21 LAB — MICROALBUMIN / CREATININE URINE RATIO
Creatinine,U: 147.5 mg/dL
Microalb Creat Ratio: 0.5 mg/g (ref 0.0–30.0)
Microalb, Ur: <0.7 mg/dL (ref 0.0–1.9)

## 2021-09-21 LAB — TSH: TSH: 1.68 u[IU]/mL (ref 0.35–5.50)

## 2021-09-24 ENCOUNTER — Other Ambulatory Visit: Payer: Self-pay | Admitting: Family

## 2021-09-24 ENCOUNTER — Encounter: Payer: Self-pay | Admitting: Internal Medicine

## 2021-09-24 NOTE — Telephone Encounter (Signed)
LMTCB

## 2021-09-27 NOTE — Telephone Encounter (Signed)
Patient states she returned [employer] message via [place].  Patient states that the diarrhea stopped when she stopped taking the Sertraline.

## 2021-09-28 ENCOUNTER — Telehealth: Payer: Self-pay | Admitting: Internal Medicine

## 2021-09-29 ENCOUNTER — Other Ambulatory Visit: Payer: Self-pay

## 2021-09-29 MED ORDER — HYDROCHLOROTHIAZIDE 25 MG PO TABS: 25.0000 mg | ORAL_TABLET | Freq: Every day | ORAL | 0 refills | Status: DC

## 2021-09-29 NOTE — Telephone Encounter (Signed)
Pt called in requesting refill on medication (hydrochlorothiazide (HYDRODIURIL) 25 MG tablet)... Pt stated that pharmacy is waiting on Dr. Derrel Nip to approve medication... Pt was wondering what is taking so long... Pt requesting callback.Marland KitchenMarland Kitchen

## 2021-09-29 NOTE — Telephone Encounter (Signed)
Just FYI I called to let patient know that HCTZ was sent. She wanted me to let you know that she did go ahead & stop the sertraline due to diarrhea. She sees you again the end of this month.

## 2021-10-04 ENCOUNTER — Encounter: Payer: Self-pay | Admitting: Internal Medicine

## 2021-10-25 ENCOUNTER — Ambulatory Visit (INDEPENDENT_AMBULATORY_CARE_PROVIDER_SITE_OTHER): Payer: Medicare Other | Admitting: Internal Medicine

## 2021-10-25 ENCOUNTER — Encounter: Payer: Self-pay | Admitting: Internal Medicine

## 2021-10-25 DIAGNOSIS — I10 Essential (primary) hypertension: Secondary | ICD-10-CM

## 2021-10-25 DIAGNOSIS — F411 Generalized anxiety disorder: Secondary | ICD-10-CM

## 2021-10-25 MED ORDER — SERTRALINE HCL 50 MG PO TABS: 50.0000 mg | ORAL_TABLET | Freq: Every day | ORAL | 1 refills | Status: DC

## 2021-10-25 NOTE — Progress Notes (Unsigned)
Subjective:  Patient ID: Rhonda Brock, female    DOB: [birth year]  Age: 66 y.o. MRN: [id number]  CC: There were no encounter diagnoses.   HPI Rhonda Brock presents for  follow up on hypertension and anxiety Chief Complaint  Patient presents with   Follow-up    4 week follow up    1) HTN:  home readings checked daily.  For the last 2 weeks .  Diastolics have been < 90 and systolic < 193 on hctz 25 mg daily  2) GAD:  taking sertraline ,  no repeat diarrhea.   Outpatient Medications Prior to Visit  Medication Sig Dispense Refill   calcium carbonate (TUMS - DOSED IN MG ELEMENTAL CALCIUM) 500 MG chewable tablet Chew 1 tablet by mouth daily.     finasteride (PROSCAR) 5 MG tablet Take 2.5 mg by mouth daily.     hydrochlorothiazide (HYDRODIURIL) 25 MG tablet Take 1 tablet (25 mg total) by mouth daily. 90 tablet 0   sertraline (ZOLOFT) 25 MG tablet Take 1 tablet (25 mg total) by mouth daily. For several daysy,  then increase to 2 daily 90 tablet 3   telmisartan (MICARDIS) 20 MG tablet TAKE 1 TABLET BY MOUTH EVERY DAY (Patient not taking: Reported on 10/25/2021) 90 tablet 1   No facility-administered medications prior to visit.    Review of Systems;  Patient denies headache, fevers, malaise, unintentional weight loss, skin rash, eye pain, sinus congestion and sinus pain, sore throat, dysphagia,  hemoptysis , cough, dyspnea, wheezing, chest pain, palpitations, orthopnea, edema, abdominal pain, nausea, melena, diarrhea, constipation, flank pain, dysuria, hematuria, urinary  Frequency, nocturia, numbness, tingling, seizures,  Focal weakness, Loss of consciousness,  Tremor, insomnia, depression, anxiety, and suicidal ideation.      Objective:  BP 138/82 (BP Location: Left Arm, Patient Position: Sitting, Cuff Size: Normal)   Pulse 75   Temp 98.3 F (36.8 C) (Oral)   Ht '5\' 5"'$  (1.651 m)   Wt 197 lb 3.2 oz (89.4 kg)   SpO2 97%   BMI 32.82 kg/m   BP Readings from Last 3 Encounters:   10/25/21 138/82  09/20/21 120/84  07/28/21 115/82    Wt Readings from Last 3 Encounters:  10/25/21 197 lb 3.2 oz (89.4 kg)  09/20/21 195 lb 6.4 oz (88.6 kg)  07/28/21 189 lb (85.7 kg)    General appearance: alert, cooperative and appears stated age Ears: normal TM's and external ear canals both ears Throat: lips, mucosa, and tongue normal; teeth and gums normal Neck: no adenopathy, no carotid bruit, supple, symmetrical, trachea midline and thyroid not enlarged, symmetric, no tenderness/mass/nodules Back: symmetric, no curvature. ROM normal. No CVA tenderness. Lungs: clear to auscultation bilaterally Heart: regular rate and rhythm, S1, S2 normal, no murmur, click, rub or gallop Abdomen: soft, non-tender; bowel sounds normal; no masses,  no organomegaly Pulses: 2+ and symmetric Skin: Skin color, texture, turgor normal. No rashes or lesions Lymph nodes: Cervical, supraclavicular, and axillary nodes normal.  Lab Results  Component Value Date   HGBA1C 5.6 06/11/2020   HGBA1C 5.6 02/19/2018    Lab Results  Component Value Date   CREATININE 0.90 09/20/2021   CREATININE 0.92 07/08/2020   CREATININE 0.85 06/11/2020    Lab Results  Component Value Date   WBC 9.5 07/26/2021   HGB 14.3 07/26/2021   HCT 41.2 07/26/2021   PLT 299 07/26/2021   GLUCOSE 89 09/20/2021   CHOL 174 06/11/2020   TRIG 125.0 06/11/2020   HDL 56.60 06/11/2020  LDLDIRECT 117.0 02/19/2018   LDLCALC 93 06/11/2020   ALT 15 09/20/2021   AST 18 09/20/2021   NA 138 09/20/2021   K 3.7 09/20/2021   CL 100 09/20/2021   CREATININE 0.90 09/20/2021   BUN 13 09/20/2021   CO2 30 09/20/2021   TSH 1.68 09/20/2021   HGBA1C 5.6 06/11/2020   MICROALBUR <0.7 09/20/2021    No results found.  Assessment & Plan:   Problem List Items Addressed This Visit   None   I spent a total of   minutes with this patient in a face to face visit on the date of this encounter reviewing the last office visit with me on         ,  most recent with patient's cardiologist in    ,  patient'ss diet and eating habits, home blood pressure readings ,  most recent imaging study ,   and post visit ordering of testing and therapeutics.    Follow-up: No follow-ups on file.   Crecencio Mc, MD

## 2021-10-25 NOTE — Patient Instructions (Addendum)
WATCH YOUR Salt intake ,  it may increase your blood pressure  As long as readings are < 140/90,  no changes  are needed

## 2021-10-26 DIAGNOSIS — M2012 Hallux valgus (acquired), left foot: Secondary | ICD-10-CM | POA: Diagnosis not present

## 2021-10-26 DIAGNOSIS — M2042 Other hammer toe(s) (acquired), left foot: Secondary | ICD-10-CM | POA: Diagnosis not present

## 2021-10-26 NOTE — Assessment & Plan Note (Signed)
She previously reported waking up with palpitations and having decreased concentration due to worrying bout her husband Rhonda Brock .  Trial of sertraline starting at 25 mg daily with titration to 50 mg has improved her anxiety level.  no changes today

## 2021-10-26 NOTE — Assessment & Plan Note (Signed)
Well controlled on current regimen of hctz 25 mg daily.     Advised to suspend medication during any GI illness that would lead to dehydration  And to report any recurrent muscle cramping

## 2021-10-26 NOTE — Assessment & Plan Note (Signed)
Now managed with periodic donation of blood (every 3-4 months per Hematology)

## 2021-11-30 ENCOUNTER — Telehealth: Payer: Self-pay | Admitting: Internal Medicine

## 2021-11-30 NOTE — Telephone Encounter (Signed)
Placed in yellow results folder.  °

## 2021-11-30 NOTE — Telephone Encounter (Signed)
Patient dropped off BP readings. Readings are up front in Dr Tullo's color folder. 

## 2021-12-05 ENCOUNTER — Encounter: Payer: Self-pay | Admitting: Oncology

## 2021-12-06 ENCOUNTER — Other Ambulatory Visit: Payer: Self-pay

## 2021-12-10 NOTE — Assessment & Plan Note (Signed)
Now managed with periodic blood donation

## 2021-12-24 ENCOUNTER — Other Ambulatory Visit: Payer: Self-pay | Admitting: Internal Medicine

## 2021-12-24 DIAGNOSIS — L668 Other cicatricial alopecia: Secondary | ICD-10-CM | POA: Diagnosis not present

## 2021-12-27 DIAGNOSIS — H40013 Open angle with borderline findings, low risk, bilateral: Secondary | ICD-10-CM | POA: Diagnosis not present

## 2021-12-27 DIAGNOSIS — H25093 Other age-related incipient cataract, bilateral: Secondary | ICD-10-CM | POA: Diagnosis not present

## 2021-12-27 DIAGNOSIS — H43812 Vitreous degeneration, left eye: Secondary | ICD-10-CM | POA: Diagnosis not present

## 2022-01-27 DIAGNOSIS — Z1231 Encounter for screening mammogram for malignant neoplasm of breast: Secondary | ICD-10-CM | POA: Diagnosis not present

## 2022-01-27 DIAGNOSIS — Z1239 Encounter for other screening for malignant neoplasm of breast: Secondary | ICD-10-CM | POA: Diagnosis not present

## 2022-01-27 LAB — HM MAMMOGRAPHY

## 2022-01-28 ENCOUNTER — Inpatient Hospital Stay: Payer: Medicare Other | Attending: Oncology

## 2022-01-28 DIAGNOSIS — I10 Essential (primary) hypertension: Secondary | ICD-10-CM | POA: Diagnosis not present

## 2022-01-28 DIAGNOSIS — Z90710 Acquired absence of both cervix and uterus: Secondary | ICD-10-CM | POA: Insufficient documentation

## 2022-01-28 DIAGNOSIS — Z803 Family history of malignant neoplasm of breast: Secondary | ICD-10-CM | POA: Insufficient documentation

## 2022-01-28 LAB — CBC WITH DIFFERENTIAL/PLATELET
Abs Immature Granulocytes: 0.08 10*3/uL — ABNORMAL HIGH (ref 0.00–0.07)
Basophils Absolute: 0.1 10*3/uL (ref 0.0–0.1)
Basophils Relative: 1 %
Eosinophils Absolute: 0.3 10*3/uL (ref 0.0–0.5)
Eosinophils Relative: 3 %
HCT: 42.4 % (ref 36.0–46.0)
Hemoglobin: 14.7 g/dL (ref 12.0–15.0)
Immature Granulocytes: 1 %
Lymphocytes Relative: 23 %
Lymphs Abs: 2.2 10*3/uL (ref 0.7–4.0)
MCH: 32.8 pg (ref 26.0–34.0)
MCHC: 34.7 g/dL (ref 30.0–36.0)
MCV: 94.6 fL (ref 80.0–100.0)
Monocytes Absolute: 0.9 10*3/uL (ref 0.1–1.0)
Monocytes Relative: 9 %
Neutro Abs: 6.3 10*3/uL (ref 1.7–7.7)
Neutrophils Relative %: 63 %
Platelets: 317 10*3/uL (ref 150–400)
RBC: 4.48 MIL/uL (ref 3.87–5.11)
RDW: 12.2 % (ref 11.5–15.5)
WBC: 9.8 10*3/uL (ref 4.0–10.5)
nRBC: 0 % (ref 0.0–0.2)

## 2022-01-28 LAB — HEPATIC FUNCTION PANEL
ALT: 17 U/L (ref 0–44)
AST: 24 U/L (ref 15–41)
Albumin: 4 g/dL (ref 3.5–5.0)
Alkaline Phosphatase: 71 U/L (ref 38–126)
Bilirubin, Direct: 0.1 mg/dL (ref 0.0–0.2)
Indirect Bilirubin: 0.6 mg/dL (ref 0.3–0.9)
Total Bilirubin: 0.7 mg/dL (ref 0.3–1.2)
Total Protein: 7.1 g/dL (ref 6.5–8.1)

## 2022-01-28 LAB — IRON AND TIBC
Iron: 152 ug/dL (ref 28–170)
Saturation Ratios: 48 % — ABNORMAL HIGH (ref 10.4–31.8)
TIBC: 315 ug/dL (ref 250–450)
UIBC: 163 ug/dL

## 2022-01-28 LAB — FERRITIN: Ferritin: 29 ng/mL (ref 11–307)

## 2022-01-31 ENCOUNTER — Encounter: Payer: Self-pay | Admitting: Oncology

## 2022-01-31 ENCOUNTER — Inpatient Hospital Stay: Payer: Medicare Other | Admitting: Oncology

## 2022-01-31 DIAGNOSIS — Z803 Family history of malignant neoplasm of breast: Secondary | ICD-10-CM | POA: Diagnosis not present

## 2022-01-31 DIAGNOSIS — I10 Essential (primary) hypertension: Secondary | ICD-10-CM | POA: Diagnosis not present

## 2022-01-31 NOTE — Progress Notes (Signed)
Hematology/Oncology Progress note [phone] [phone]      Patient Care Team: Crecencio Mc, MD as PCP - General (Internal Medicine) Crecencio Mc, MD (Internal Medicine) Bary Castilla Forest Gleason, MD (General Surgery) Earlie Server, MD as Consulting Physician (Hematology and Oncology)  ASSESSMENT & PLAN:   Hemochromatosis, hereditary [facility]) Homozygous hemochromatosis mutation/iron overload Labs reviewed and discussed with patient.  LFTs stable. Ferritin is less than 50, at goal.  Isolated iron saturation elevation.  Recommend observation for now. She may continue to proceed with blood donation in 4 months with blood bank. Avoid iron supplementation, alcohol vitamin C supplementation.   Orders Placed This Encounter  Procedures   CBC with Differential/Platelet    Standing Status:   Future    Standing Expiration Date:   01/31/2023   Iron and TIBC([facility])    Standing Status:   Future    Standing Expiration Date:   02/01/2023   Comprehensive metabolic panel    Standing Status:   Future    Standing Expiration Date:   01/31/2023   Ferritin    Standing Status:   Future    Standing Expiration Date:   02/01/2023    All questions were answered. The patient knows to call the clinic with any problems, questions or concerns.  Earlie Server, MD, PhD [facility] Hematology Oncology 01/31/2022   CHIEF COMPLAINTS/REASON FOR VISIT:  Patient presented to follow-up for management of hemochromatosis and iron overload.  HISTORY OF PRESENTING ILLNESS:  Rhonda Brock is a  66 y.o.  female with PMH listed below who was presents for follow up of hereditary hemochromatosis, homozygous C282Y mutation. #   She has followed up with gastroenterologist and has received hepatitis vaccination.  Was recommended no need for liver biopsy   INTERVAL HISTORY Rhonda Brock is a 66 y.o. female who has above history reviewed by me today presents for follow up visit for management of  hereditary hemochromatosis with iron overload. Patient reports feeling well.  She has no new complaints. Patient has started to donate blood to [employer].  She donated in August 2023. Marland Kitchen Review of Systems  Constitutional:  Negative for appetite change, chills, fatigue and fever.  HENT:   Negative for hearing loss and voice change.   Eyes:  Negative for eye problems.  Respiratory:  Negative for chest tightness and cough.   Cardiovascular:  Negative for chest pain.  Gastrointestinal:  Negative for abdominal distention, abdominal pain and blood in stool.  Endocrine: Negative for hot flashes.  Genitourinary:  Negative for difficulty urinating and frequency.   Musculoskeletal:  Negative for arthralgias.  Skin:  Negative for itching and rash.  Neurological:  Negative for extremity weakness.  Hematological:  Negative for adenopathy.  Psychiatric/Behavioral:  Negative for confusion.     MEDICAL HISTORY:  Past Medical History:  Diagnosis Date   Anxiety    Back pain    Chronic headaches    Cyst (solitary) of breast    bilaterally   GERD (gastroesophageal reflux disease)    Hemochromatosis, hereditary ([place]) 03/22/2018   Hypertension     SURGICAL HISTORY: Past Surgical History:  Procedure Laterality Date   ABDOMINAL HYSTERECTOMY  2001   APPENDECTOMY  1962   breast cysts     BREAST SURGERY  2004, 2005   left breast cyst drained x 2   CHOLECYSTECTOMY  06/2019   COLONOSCOPY  06/08/2006   COLONOSCOPY WITH PROPOFOL N/A 06/29/2016   Procedure: COLONOSCOPY WITH PROPOFOL;  Surgeon: Robert Bellow, MD;  Location: [facility] ENDOSCOPY;  Service: Endoscopy;  Laterality: N/A;   TONSILLECTOMY  1970    SOCIAL HISTORY: Social History   Socioeconomic History   Marital status: Married    Spouse name: Not on file   Number of children: Not on file   Years of education: 14   Highest education level: Not on file  Occupational History   Occupation: Homemaker  Tobacco Use   Smoking status: Never    Smokeless tobacco: Never  Vaping Use   Vaping Use: Never used  Substance and Sexual Activity   Alcohol use: No   Drug use: No   Sexual activity: Not on file  Other Topics Concern   Not on file  Social History Narrative   Regular exercise-yes   Caffeine use-yes         Social Determinants of Health   Financial Resource Strain: Low Risk  (06/23/2021)   Overall Financial Resource Strain (CARDIA)    Difficulty of Paying Living Expenses: Not hard at all  Food Insecurity: No Food Insecurity (06/23/2021)   Hunger Vital Sign    Worried About Running Out of Food in the Last Year: Never true    [place] in the Last Year: Never true  Transportation Needs: No Transportation Needs (06/23/2021)   PRAPARE - Hydrologist (Medical): No    Lack of Transportation (Non-Medical): No  Physical Activity: Insufficiently Active (06/23/2021)   Exercise Vital Sign    Days of Exercise per Week: 3 days    Minutes of Exercise per Session: 20 min  Stress: No Stress Concern Present (06/23/2021)   [place]    Feeling of Stress : Only a little  Social Connections: Unknown (06/23/2021)   Social Connection and Isolation Panel [NHANES]    Frequency of Communication with Friends and Family: More than three times a week    Frequency of Social Gatherings with Friends and Family: More than three times a week    Attends Religious Services: Not on file    Active Member of Clubs or Organizations: Not on file    Attends Archivist Meetings: Not on file    Marital Status: Married  Intimate Partner Violence: Not At Risk (06/23/2021)   Humiliation, Afraid, Rape, and Kick questionnaire    Fear of Current or Ex-Partner: No    Emotionally Abused: No    Physically Abused: No    Sexually Abused: No    FAMILY HISTORY: Family History  Problem Relation Age of Onset   Cancer Mother 44       Breast  Cancer-Mastectomy, Also Abdominal cancer   Asthma Father    Diabetes Father     ALLERGIES:  has No Known Allergies.  MEDICATIONS:  Current Outpatient Medications  Medication Sig Dispense Refill   calcium carbonate (TUMS - DOSED IN MG ELEMENTAL CALCIUM) 500 MG chewable tablet Chew 1 tablet by mouth daily.     finasteride (PROSCAR) 5 MG tablet Take 2.5 mg by mouth daily.     hydrochlorothiazide (HYDRODIURIL) 25 MG tablet TAKE 1 TABLET (25 MG TOTAL) BY MOUTH DAILY. 90 tablet 0   sertraline (ZOLOFT) 50 MG tablet Take 1 tablet (50 mg total) by mouth daily. ` 90 tablet 1   No current facility-administered medications for this visit.     PHYSICAL EXAMINATION: ECOG PERFORMANCE STATUS: 0 - Asymptomatic Vitals:   01/31/22 1300  BP: 129/83  Pulse: 72  Resp: 16  Temp: 99.4 F (37.4 C)   Filed Weights   01/31/22 1300  Weight: 203 lb 4.8 oz (92.2 kg)    Physical Exam Constitutional:      General: She is not in acute distress. HENT:     Head: Normocephalic and atraumatic.  Eyes:     General: No scleral icterus.    Pupils: Pupils are equal, round, and reactive to light.  Cardiovascular:     Rate and Rhythm: Normal rate and regular rhythm.     Heart sounds: Normal heart sounds.  Pulmonary:     Effort: Pulmonary effort is normal. No respiratory distress.     Breath sounds: No wheezing.  Abdominal:     General: Bowel sounds are normal. There is no distension.     Palpations: Abdomen is soft. There is no mass.     Tenderness: There is no abdominal tenderness.  Musculoskeletal:        General: No deformity. Normal range of motion.     Cervical back: Normal range of motion and neck supple.  Skin:    General: Skin is warm and dry.     Findings: No erythema or rash.  Neurological:     Mental Status: She is alert and oriented to person, place, and time. Mental status is at baseline.     Cranial Nerves: No cranial nerve deficit.     Coordination: Coordination normal.  Psychiatric:         Mood and Affect: Mood normal.      LABORATORY DATA:  I have reviewed the data as listed Lab Results  Component Value Date   WBC 9.8 01/28/2022   HGB 14.7 01/28/2022   HCT 42.4 01/28/2022   MCV 94.6 01/28/2022   PLT 317 01/28/2022   Recent Labs    07/26/21 1320 09/20/21 1629 01/28/22 1308  NA  --  138  --   K  --  3.7  --   CL  --  100  --   CO2  --  30  --   GLUCOSE  --  89  --   BUN  --  13  --   CREATININE  --  0.90  --   CALCIUM  --  9.3  --   PROT 6.7 6.4 7.1  ALBUMIN 3.7 4.1 4.0  AST '22 18 24  '$ ALT '16 15 17  '$ ALKPHOS 60 63 71  BILITOT 0.6 0.6 0.7  BILIDIR 0.1  --  0.1  IBILI 0.5  --  0.6    Iron/TIBC/Ferritin/ %Sat    Component Value Date/Time   IRON 152 01/28/2022 1308   IRON 130 12/16/2011 1459   TIBC 315 01/28/2022 1308   TIBC 226 (L) 12/16/2011 1459   FERRITIN 29 01/28/2022 1308   FERRITIN 367 12/16/2011 1459   IRONPCTSAT 48 (H) 01/28/2022 1308   IRONPCTSAT 66 (H) 02/19/2018 0830     RADIOGRAPHIC STUDIES: I have personally reviewed the radiological images as listed and agreed with the findings in the report.  10/06/2017 Mammogram Negative, recommend annual screening mammogram.

## 2022-01-31 NOTE — Assessment & Plan Note (Signed)
Homozygous hemochromatosis mutation/iron overload Labs reviewed and discussed with patient.  LFTs stable. Ferritin is less than 50, at goal.  Isolated iron saturation elevation.  Recommend observation for now. She may continue to proceed with blood donation in 4 months with blood bank. Avoid iron supplementation, alcohol vitamin C supplementation.

## 2022-01-31 NOTE — Progress Notes (Signed)
Patient denies new problems/concerns today.   °

## 2022-02-25 ENCOUNTER — Ambulatory Visit (INDEPENDENT_AMBULATORY_CARE_PROVIDER_SITE_OTHER): Payer: Medicare Other

## 2022-02-25 ENCOUNTER — Encounter: Payer: Self-pay | Admitting: Family

## 2022-02-25 ENCOUNTER — Ambulatory Visit (INDEPENDENT_AMBULATORY_CARE_PROVIDER_SITE_OTHER): Payer: Medicare Other | Admitting: Family

## 2022-02-25 DIAGNOSIS — M25562 Pain in left knee: Secondary | ICD-10-CM

## 2022-02-25 NOTE — Progress Notes (Signed)
Subjective:    Patient ID: Rhonda Brock, female    DOB: 08-28-1955, 66 y.o.   MRN: [id number]  CC: Rhonda Brock is a 66 y.o. female who presents today for an acute visit.    HPI: Complains of left medial knee pain Pain has been gradual onset over 2-3 weeks, worsening. Painful when sleeping when lying on side and getting comfortable.   Pain when bending left knee. No pain with long periods of standing. She walks with neighbor yesterday for 20 minutes and knee 'was okay'  No swelling, increased heat, injury. No sensation of giving way.   Tylenol with some relief.        Previously saw patient December 2022 for right leg pain.  Started on meloxicam at that time. Back pain and right leg pain has resolved.  No ckd HISTORY:  Past Medical History:  Diagnosis Date   Anxiety    Back pain    Chronic headaches    Cyst (solitary) of breast    bilaterally   GERD (gastroesophageal reflux disease)    Hemochromatosis, hereditary ([place]) 03/22/2018   Hypertension    Past Surgical History:  Procedure Laterality Date   ABDOMINAL HYSTERECTOMY  2001   APPENDECTOMY  1962   breast cysts     BREAST SURGERY  2004, 2005   left breast cyst drained x 2   CHOLECYSTECTOMY  06/2019   COLONOSCOPY  06/08/2006   COLONOSCOPY WITH PROPOFOL N/A 06/29/2016   Procedure: COLONOSCOPY WITH PROPOFOL;  Surgeon: Robert Bellow, MD;  Location: [facility] ENDOSCOPY;  Service: Endoscopy;  Laterality: N/A;   TONSILLECTOMY  1970   Family History  Problem Relation Age of Onset   Cancer Mother 53       Breast Cancer-Mastectomy, Also Abdominal cancer   Asthma Father    Diabetes Father     Allergies: Patient has no known allergies. Current Outpatient Medications on File Prior to Visit  Medication Sig Dispense Refill   calcium carbonate (TUMS - DOSED IN MG ELEMENTAL CALCIUM) 500 MG chewable tablet Chew 1 tablet by mouth daily.     finasteride (PROSCAR) 5 MG tablet Take 2.5 mg by mouth daily.      hydrochlorothiazide (HYDRODIURIL) 25 MG tablet TAKE 1 TABLET (25 MG TOTAL) BY MOUTH DAILY. 90 tablet 0   sertraline (ZOLOFT) 50 MG tablet Take 1 tablet (50 mg total) by mouth daily. ` 90 tablet 1   No current facility-administered medications on file prior to visit.    Social History   Tobacco Use   Smoking status: Never   Smokeless tobacco: Never  Vaping Use   Vaping Use: Never used  Substance Use Topics   Alcohol use: No   Drug use: No    Review of Systems  Constitutional:  Negative for chills and fever.  Respiratory:  Negative for cough.   Cardiovascular:  Negative for chest pain and palpitations.  Gastrointestinal:  Negative for nausea and vomiting.  Musculoskeletal:  Positive for arthralgias. Negative for joint swelling.  Skin:  Negative for rash.      Objective:    There were no vitals taken for this visit.   Physical Exam Vitals reviewed.  Constitutional:      Appearance: She is well-developed.  Eyes:     Conjunctiva/sclera: Conjunctivae normal.  Cardiovascular:     Rate and Rhythm: Normal rate and regular rhythm.     Pulses: Normal pulses.     Heart sounds: Normal heart sounds.  Pulmonary:     Effort:  Pulmonary effort is normal.     Breath sounds: Normal breath sounds. No wheezing, rhonchi or rales.  Musculoskeletal:     Right knee: No swelling or deformity.     Left knee: No swelling, deformity or bony tenderness. Normal range of motion. Tenderness present over the medial joint line.     Comments: Bilateral knees are symmetric. No effusion appreciated. No increase in warmth or erythema.  Left knee:  Able to extend to -5 to 10 degrees and flex to 110 degrees. No catching with McMurray maneuver. No patellar apprehension.  Tenderness medial joint line. No calf tenderness of lower leg edema bilaterally.    Skin:    General: Skin is warm and dry.  Neurological:     Mental Status: She is alert.  Psychiatric:        Speech: Speech normal.        Behavior:  Behavior normal.        Thought Content: Thought content normal.        Assessment & Plan:   Problem List Items Addressed This Visit       Other   Left knee pain - Primary    Knee pain and I anticipate arthritic changes or potentially meniscal etiology.  Patient and I jointly agreed start with plain film x-ray and treat conservatively with icing regimen, meloxicam prn, Ace wrap and exercises at home.  She will let me how she is doing, and we will discuss next steps including seeing orthopedic and physical therapy based on how she is feeling      Relevant Orders   DG Knee Complete 4 Views Left      I am having Rhonda Brock maintain her calcium carbonate, finasteride, sertraline, and hydrochlorothiazide.   No orders of the defined types were placed in this encounter.   Return precautions given.   Risks, benefits, and alternatives of the medications and treatment plan prescribed today were discussed, and patient expressed understanding.   Education regarding symptom management and diagnosis given to patient on AVS.  Continue to follow with Crecencio Mc, MD for routine health maintenance.   Rhonda Brock and I agreed with plan.   Mable Paris, FNP

## 2022-02-25 NOTE — Assessment & Plan Note (Addendum)
Knee pain and I anticipate arthritic changes or potentially meniscal etiology.  Patient and I jointly agreed start with plain film x-ray and treat conservatively with icing regimen, meloxicam prn, Ace wrap and exercises at home.  She will let me how she is doing, and we will discuss next steps including seeing orthopedic and physical therapy based on how she is feeling

## 2022-02-25 NOTE — Patient Instructions (Addendum)
As discussed, we will start with conservative management of your left knee pain.  Ice for 20 minutes twice per day.  Ace wrap .  you may take meloxicam which I prescribed last year as needed with food for pain.  It is a potent anti-inflammatory like ibuprofen.  Exercises provided below.  If no improvement let me know and I recommend a consult with orthopedics  A couple of points in regards to meloxicam ( Mobic) -  This medication is not intended for daily , long term use. It is a potent anti inflammatory ( NSAID), and my intention is for you take as needed for moderate to severe pain. If you find yourself using daily, please let me know.   Please takes Mobic ( meloxicam) with FOOD since it is an anti-inflammatory as it can cause a GI bleed or ulcer. If you have a history of GI bleed or ulcer, please do NOT take.  Do no take over the counter aleve, motrin, advil, goody's powder for pain as they are also NSAIDs, and they are  in the same class as Mobic  Lastly, we will need to monitor kidney function while on Mobic, and if we were to see any decline in kidney function in the future, we would have to discontinue this medication.   Knee Exercises Ask your health care provider which exercises are safe for you. Do exercises exactly as told by your health care provider and adjust them as directed. It is normal to feel mild stretching, pulling, tightness, or discomfort as you do these exercises. Stop right away if you feel sudden pain or your pain gets worse. Do not begin these exercises until told by your health care provider. Stretching and range-of-motion exercises These exercises warm up your muscles and joints and improve the movement and flexibility of your knee. These exercises also help to relieve pain and swelling. Knee extension, prone  Lie on your abdomen (prone position) on a bed. Place your left / right knee just beyond the edge of the surface so your knee is not on the bed. You can put a  towel under your left / right thigh just above your kneecap for comfort. Relax your leg muscles and allow gravity to straighten your knee (extension). You should feel a stretch behind your left / right knee. Hold this position for __________ seconds. Scoot up so your knee is supported between repetitions. Repeat __________ times. Complete this exercise __________ times a day. Knee flexion, active  Lie on your back with both legs straight. If this causes back discomfort, bend your left / right knee so your foot is flat on the floor. Slowly slide your left / right heel back toward your buttocks. Stop when you feel a gentle stretch in the front of your knee or thigh (flexion). Hold this position for __________ seconds. Slowly slide your left / right heel back to the starting position. Repeat __________ times. Complete this exercise __________ times a day. Quadriceps stretch, prone  Lie on your abdomen on a firm surface, such as a bed or padded floor. Bend your left / right knee and hold your ankle. If you cannot reach your ankle or pant leg, loop a belt around your foot and grab the belt instead. Gently pull your heel toward your buttocks. Your knee should not slide out to the side. You should feel a stretch in the front of your thigh and knee (quadriceps). Hold this position for __________ seconds. Repeat __________ times. Complete this exercise  __________ times a day. Hamstring, supine  Lie on your back (supine position). Loop a belt or towel over the ball of your left / right foot. The ball of your foot is on the walking surface, right under your toes. Straighten your left / right knee and slowly pull on the belt to raise your leg until you feel a gentle stretch behind your knee (hamstring). Do not let your knee bend while you do this. Keep your other leg flat on the floor. Hold this position for __________ seconds. Repeat __________ times. Complete this exercise __________ times a  day. Strengthening exercises These exercises build strength and endurance in your knee. Endurance is the ability to use your muscles for a long time, even after they get tired. Quadriceps, isometric This exercise strengthens the muscles in front of your thigh (quadriceps) without moving your knee joint (isometric). Lie on your back with your left / right leg extended and your other knee bent. Put a rolled towel or small pillow under your knee if told by your health care provider. Slowly tense the muscles in the front of your left / right thigh. You should see your kneecap slide up toward your hip or see increased dimpling just above the knee. This motion will push the back of the knee toward the floor. For __________ seconds, hold the muscle as tight as you can without increasing your pain. Relax the muscles slowly and completely. Repeat __________ times. Complete this exercise __________ times a day. Straight leg raises This exercise strengthens the muscles in front of your thigh (quadriceps) and the muscles that move your hips (hip flexors). Lie on your back with your left / right leg extended and your other knee bent. Tense the muscles in the front of your left / right thigh. You should see your kneecap slide up or see increased dimpling just above the knee. Your thigh may even shake a bit. Keep these muscles tight as you raise your leg 4-6 inches (10-15 cm) off the floor. Do not let your knee bend. Hold this position for __________ seconds. Keep these muscles tense as you lower your leg. Relax your muscles slowly and completely after each repetition. Repeat __________ times. Complete this exercise __________ times a day. Hamstring, isometric  Lie on your back on a firm surface. Bend your left / right knee about __________ degrees. Dig your left / right heel into the surface as if you are trying to pull it toward your buttocks. Tighten the muscles in the back of your thighs (hamstring) to  "dig" as hard as you can without increasing any pain. Hold this position for __________ seconds. Release the tension gradually and allow your muscles to relax completely for __________ seconds after each repetition. Repeat __________ times. Complete this exercise __________ times a day. Hamstring curls If told by your health care provider, do this exercise while wearing ankle weights. Begin with __________lb / kg weights. Then increase the weight by 1 lb (0.5 kg) increments. Do not wear ankle weights that are more than __________lb / kg. Lie on your abdomen with your legs straight. Bend your left / right knee as far as you can without feeling pain. Keep your hips flat against the floor. Hold this position for __________ seconds. Slowly lower your leg to the starting position. Repeat __________ times. Complete this exercise __________ times a day. Squats This exercise strengthens the muscles in front of your thigh and knee (quadriceps). Stand in front of a table, with your feet  and knees pointing straight ahead. You may rest your hands on the table for balance but not for support. Slowly bend your knees and lower your hips like you are going to sit in a chair. Keep your weight over your heels, not over your toes. Keep your lower legs upright so they are parallel with the table legs. Do not let your hips go lower than your knees. Do not bend lower than told by your health care provider. If your knee pain increases, do not bend as low. Hold the squat position for __________ seconds. Slowly push with your legs to return to standing. Do not use your hands to pull yourself to standing. Repeat __________ times. Complete this exercise __________ times a day. Wall slides This exercise strengthens the muscles in front of your thigh and knee (quadriceps). Lean your back against a smooth wall or door, and walk your feet out 18-24 inches (46-61 cm) from it. Place your feet hip-width apart. Slowly slide  down the wall or door until your knees bend __________ degrees. Keep your knees over your heels, not over your toes. Keep your knees in line with your hips. Hold this position for __________ seconds. Repeat __________ times. Complete this exercise __________ times a day. Straight leg raises, side-lying This exercise strengthens the muscles that rotate the leg at the hip and move it away from your body (hip abductors). Lie on your side with your left / right leg in the top position. Lie so your head, shoulder, knee, and hip line up. You may bend your bottom knee to help you keep your balance. Roll your hips slightly forward so your hips are stacked directly over each other and your left / right knee is facing forward. Leading with your heel, lift your top leg 4-6 inches (10-15 cm). You should feel the muscles in your outer hip lifting. Do not let your foot drift forward. Do not let your knee roll toward the ceiling. Hold this position for __________ seconds. Slowly return your leg to the starting position. Let your muscles relax completely after each repetition. Repeat __________ times. Complete this exercise __________ times a day. Straight leg raises, prone This exercise stretches the muscles that move your hips away from the front of the pelvis (hip extensors). Lie on your abdomen on a firm surface. You can put a pillow under your hips if that is more comfortable. Tense the muscles in your buttocks and lift your left / right leg about 4-6 inches (10-15 cm). Keep your knee straight as you lift your leg. Hold this position for __________ seconds. Slowly lower your leg to the starting position. Let your leg relax completely after each repetition. Repeat __________ times. Complete this exercise __________ times a day. This information is not intended to replace advice given to you by your health care provider. Make sure you discuss any questions you have with your health care provider. Document  Revised: 11/24/2020 Document Reviewed: 11/24/2020 Elsevier Patient Education  [place].

## 2022-03-02 ENCOUNTER — Other Ambulatory Visit: Payer: Self-pay | Admitting: Family

## 2022-03-02 DIAGNOSIS — M79604 Pain in right leg: Secondary | ICD-10-CM

## 2022-03-08 NOTE — Telephone Encounter (Signed)
MyChart messgae sent to patient. 

## 2022-03-21 ENCOUNTER — Other Ambulatory Visit: Payer: Self-pay | Admitting: Internal Medicine

## 2022-04-20 ENCOUNTER — Other Ambulatory Visit: Payer: Self-pay | Admitting: Internal Medicine

## 2022-04-29 ENCOUNTER — Telehealth: Payer: Self-pay | Admitting: Internal Medicine

## 2022-04-29 NOTE — Telephone Encounter (Signed)
Placed in results folder.  

## 2022-04-29 NOTE — Telephone Encounter (Signed)
Pt came into the office to drop off BP readings. Placed in provider folder up front

## 2022-05-02 MED ORDER — TELMISARTAN 20 MG PO TABS: 20.0000 mg | ORAL_TABLET | Freq: Every day | ORAL | 1 refills | Status: DC

## 2022-05-02 NOTE — Addendum Note (Signed)
Addended by: Crecencio Mc on: 05/02/2022 03:51 PM   Modules accepted: Orders

## 2022-05-02 NOTE — Telephone Encounter (Signed)
Mychart sent per pt request.

## 2022-05-02 NOTE — Telephone Encounter (Signed)
Recommend resuming telmisartan at 20 mg dose.  Rx sent.

## 2022-06-16 DIAGNOSIS — M71342 Other bursal cyst, left hand: Secondary | ICD-10-CM | POA: Diagnosis not present

## 2022-06-16 DIAGNOSIS — L668 Other cicatricial alopecia: Secondary | ICD-10-CM | POA: Diagnosis not present

## 2022-06-17 ENCOUNTER — Other Ambulatory Visit: Payer: Self-pay | Admitting: Family

## 2022-06-21 ENCOUNTER — Telehealth: Payer: Self-pay | Admitting: Internal Medicine

## 2022-06-21 NOTE — Telephone Encounter (Signed)
Copied from [place] [id number]. Topic: Medicare AWV >> Jun 21, 2022  3:29 PM Lollie Marrow wrote: Reason for CRM: Called patient to schedule Medicare Annual Wellness Visit (AWV). Left message for patient to call back and schedule Medicare Annual Wellness Visit (AWV).  Last date of AWV: 06/23/2021  Please schedule an AWVS appointment at any time with [facility].  If any questions, please contact me at [phone].    Thank you,  [place] Direct dial  [phone]

## 2022-06-22 ENCOUNTER — Encounter: Payer: Self-pay | Admitting: Internal Medicine

## 2022-06-22 ENCOUNTER — Telehealth: Payer: Self-pay | Admitting: Internal Medicine

## 2022-06-22 ENCOUNTER — Other Ambulatory Visit: Payer: Self-pay | Admitting: Internal Medicine

## 2022-06-22 NOTE — Telephone Encounter (Signed)
Contacted Rondi Mcnett to schedule their annual wellness visit. Appointment made for 07/01/2022.  Thank you,  [place] Direct dial  [phone]

## 2022-07-01 ENCOUNTER — Ambulatory Visit (INDEPENDENT_AMBULATORY_CARE_PROVIDER_SITE_OTHER): Payer: Medicare Other

## 2022-07-01 VITALS — Ht 65.0 in | Wt 203.0 lb

## 2022-07-01 DIAGNOSIS — Z0000 Encounter for general adult medical examination without abnormal findings: Secondary | ICD-10-CM | POA: Diagnosis not present

## 2022-07-01 NOTE — Patient Instructions (Addendum)
Rhonda Brock , Thank you for taking time to come for your Medicare Wellness Visit. I appreciate your ongoing commitment to your health goals. Please review the following plan we discussed and let me know if I can assist you in the future.   These are the goals we discussed:  Goals      Maintain Healthy Lifestyle     Stay active Healthy diet Stay hydrated        This is a list of the screening recommended for you and due dates:  Health Maintenance  Topic Date Due   DTaP/Tdap/Td vaccine (2 - Td or Tdap) 03/28/2021   COVID-19 Vaccine (4 - 2023-24 season) 07/17/2022*   Zoster (Shingles) Vaccine (1 of 2) 09/30/2022*   Pneumonia Vaccine (1 of 1 - PCV) 07/01/2023*   Flu Shot  10/27/2022   Mammogram  01/27/2023   Medicare Annual Wellness Visit  07/01/2023   Colon Cancer Screening  06/30/2026   DEXA scan (bone density measurement)  Completed   Hepatitis C Screening: USPSTF Recommendation to screen - Ages 71-79 yo.  Completed   HPV Vaccine  Aged Out  *Topic was postponed. The date shown is not the original due date.    Advanced directives: on file  Conditions/risks identified: none new  Next appointment: Follow up in one year for your annual wellness visit    Preventive Care 65 Years and Older, Female Preventive care refers to lifestyle choices and visits with your health care provider that can promote health and wellness. What does preventive care include? A yearly physical exam. This is also called an annual well check. Dental exams once or twice a year. Routine eye exams. Ask your health care provider how often you should have your eyes checked. Personal lifestyle choices, including: Daily care of your teeth and gums. Regular physical activity. Eating a healthy diet. Avoiding tobacco and drug use. Limiting alcohol use. Practicing safe sex. Taking low-dose aspirin every day. Taking vitamin and mineral supplements as recommended by your health care provider. What happens  during an annual well check? The services and screenings done by your health care provider during your annual well check will depend on your age, overall health, lifestyle risk factors, and family history of disease. Counseling  Your health care provider may ask you questions about your: Alcohol use. Tobacco use. Drug use. Emotional well-being. Home and relationship well-being. Sexual activity. Eating habits. History of falls. Memory and ability to understand (cognition). Work and work Astronomer. Reproductive health. Screening  You may have the following tests or measurements: Height, weight, and BMI. Blood pressure. Lipid and cholesterol levels. These may be checked every 5 years, or more frequently if you are over 73 years old. Skin check. Lung cancer screening. You may have this screening every year starting at age 54 if you have a 30-pack-year history of smoking and currently smoke or have quit within the past 15 years. Fecal occult blood test (FOBT) of the stool. You may have this test every year starting at age 23. Flexible sigmoidoscopy or colonoscopy. You may have a sigmoidoscopy every 5 years or a colonoscopy every 10 years starting at age 71. Hepatitis C blood test. Hepatitis B blood test. Sexually transmitted disease (STD) testing. Diabetes screening. This is done by checking your blood sugar (glucose) after you have not eaten for a while (fasting). You may have this done every 1-3 years. Bone density scan. This is done to screen for osteoporosis. You may have this done starting at age 56.  Mammogram. This may be done every 1-2 years. Talk to your health care provider about how often you should have regular mammograms. Talk with your health care provider about your test results, treatment options, and if necessary, the need for more tests. Vaccines  Your health care provider may recommend certain vaccines, such as: Influenza vaccine. This is recommended every  year. Tetanus, diphtheria, and acellular pertussis (Tdap, Td) vaccine. You may need a Td booster every 10 years. Zoster vaccine. You may need this after age 33. Pneumococcal 13-valent conjugate (PCV13) vaccine. One dose is recommended after age 4. Pneumococcal polysaccharide (PPSV23) vaccine. One dose is recommended after age 77. Talk to your health care provider about which screenings and vaccines you need and how often you need them. This information is not intended to replace advice given to you by your health care provider. Make sure you discuss any questions you have with your health care provider. Document Released: 04/10/2015 Document Revised: 12/02/2015 Document Reviewed: 01/13/2015 Elsevier Interactive Patient Education  2017 [place] Prevention in the Home Falls can cause injuries. They can happen to people of all ages. There are many things you can do to make your home safe and to help prevent falls. What can I do on the outside of my home? Regularly fix the edges of walkways and driveways and fix any cracks. Remove anything that might make you trip as you walk through a door, such as a raised step or threshold. Trim any bushes or trees on the path to your home. Use bright outdoor lighting. Clear any walking paths of anything that might make someone trip, such as rocks or tools. Regularly check to see if handrails are loose or broken. Make sure that both sides of any steps have handrails. Any raised decks and porches should have guardrails on the edges. Have any leaves, snow, or ice cleared regularly. Use sand or salt on walking paths during winter. Clean up any spills in your garage right away. This includes oil or grease spills. What can I do in the bathroom? Use night lights. Install grab bars by the toilet and in the tub and shower. Do not use towel bars as grab bars. Use non-skid mats or decals in the tub or shower. If you need to sit down in the shower, use a  plastic, non-slip stool. Keep the floor dry. Clean up any water that spills on the floor as soon as it happens. Remove soap buildup in the tub or shower regularly. Attach bath mats securely with double-sided non-slip rug tape. Do not have throw rugs and other things on the floor that can make you trip. What can I do in the bedroom? Use night lights. Make sure that you have a light by your bed that is easy to reach. Do not use any sheets or blankets that are too big for your bed. They should not hang down onto the floor. Have a firm chair that has side arms. You can use this for support while you get dressed. Do not have throw rugs and other things on the floor that can make you trip. What can I do in the kitchen? Clean up any spills right away. Avoid walking on wet floors. Keep items that you use a lot in easy-to-reach places. If you need to reach something above you, use a strong step stool that has a grab bar. Keep electrical cords out of the way. Do not use floor polish or wax that makes floors slippery. If  you must use wax, use non-skid floor wax. Do not have throw rugs and other things on the floor that can make you trip. What can I do with my stairs? Do not leave any items on the stairs. Make sure that there are handrails on both sides of the stairs and use them. Fix handrails that are broken or loose. Make sure that handrails are as long as the stairways. Check any carpeting to make sure that it is firmly attached to the stairs. Fix any carpet that is loose or worn. Avoid having throw rugs at the top or bottom of the stairs. If you do have throw rugs, attach them to the floor with carpet tape. Make sure that you have a light switch at the top of the stairs and the bottom of the stairs. If you do not have them, ask someone to add them for you. What else can I do to help prevent falls? Wear shoes that: Do not have high heels. Have rubber bottoms. Are comfortable and fit you  well. Are closed at the toe. Do not wear sandals. If you use a stepladder: Make sure that it is fully opened. Do not climb a closed stepladder. Make sure that both sides of the stepladder are locked into place. Ask someone to hold it for you, if possible. Clearly mark and make sure that you can see: Any grab bars or handrails. First and last steps. Where the edge of each step is. Use tools that help you move around (mobility aids) if they are needed. These include: Canes. Walkers. Scooters. Crutches. Turn on the lights when you go into a dark area. Replace any light bulbs as soon as they burn out. Set up your furniture so you have a clear path. Avoid moving your furniture around. If any of your floors are uneven, fix them. If there are any pets around you, be aware of where they are. Review your medicines with your doctor. Some medicines can make you feel dizzy. This can increase your chance of falling. Ask your doctor what other things that you can do to help prevent falls. This information is not intended to replace advice given to you by your health care provider. Make sure you discuss any questions you have with your health care provider. Document Released: 01/08/2009 Document Revised: 08/20/2015 Document Reviewed: 04/18/2014 Elsevier Interactive Patient Education  2017 [employer].

## 2022-07-01 NOTE — Progress Notes (Addendum)
Subjective:   Rhonda Brock is a 67 y.o. female who presents for Medicare Annual (Subsequent) preventive examination.  Review of Systems    No ROS.  Medicare Wellness Virtual Visit.  Visual/audio telehealth visit, UTA vital signs.   See social history for additional risk factors.   Cardiac Risk Factors include: advanced age (>37men, >59 women)     Objective:    Today's Vitals   07/01/22 0821  Weight: 203 lb (92.1 kg)  Height: 5\' 5"  (1.651 m)   Body mass index is 33.78 kg/m.     07/01/2022    8:23 AM 01/31/2022    1:13 PM 07/28/2021    1:08 PM 06/23/2021   10:28 AM 01/26/2021    1:04 PM 07/15/2020    1:04 PM 06/28/2019    8:18 AM  Advanced Directives  Does Patient Have a Medical Advance Directive? Yes Yes Yes Yes Yes Yes Yes  Type of Advance Directive Living will;Healthcare Power of Attorney Living will;Healthcare Power of [employer] Power of [place];Living will Healthcare Power of [place];Living will Living will;Healthcare Power of Attorney Living will  Does patient want to make changes to medical advance directive? No - Patient declined   No - Patient declined No - Patient declined    Copy of Healthcare Power of Attorney in Chart? Yes - validated most recent copy scanned in chart (See row information)   Yes - validated most recent copy scanned in chart (See row information)     Would patient like information on creating a medical advance directive?       No - Patient declined    Current Medications (verified) Outpatient Encounter Medications as of 07/01/2022  Medication Sig   calcium carbonate (TUMS - DOSED IN MG ELEMENTAL CALCIUM) 500 MG chewable tablet Chew 1 tablet by mouth daily.   finasteride (PROSCAR) 5 MG tablet Take 2.5 mg by mouth daily.   hydrochlorothiazide (HYDRODIURIL) 25 MG tablet TAKE 1 TABLET (25 MG TOTAL) BY MOUTH DAILY.   meloxicam (MOBIC) 7.5 MG tablet TAKE 1 TABLET BY MOUTH DAILY AS NEEDED FOR PAIN   sertraline (ZOLOFT) 50 MG tablet TAKE 1  TABLET (50 MG TOTAL) BY MOUTH DAILY. `   telmisartan (MICARDIS) 20 MG tablet Take 1 tablet (20 mg total) by mouth daily.   No facility-administered encounter medications on file as of 07/01/2022.    Allergies (verified) Patient has no known allergies.   History: Past Medical History:  Diagnosis Date   Anxiety    Back pain    Chronic headaches    Cyst (solitary) of breast    bilaterally   GERD (gastroesophageal reflux disease)    Hemochromatosis, hereditary 03/22/2018   Hypertension    Past Surgical History:  Procedure Laterality Date   ABDOMINAL HYSTERECTOMY  2001   APPENDECTOMY  1962   breast cysts     BREAST SURGERY  2004, 2005   left breast cyst drained x 2   CHOLECYSTECTOMY  06/2019   COLONOSCOPY  06/08/2006   COLONOSCOPY WITH PROPOFOL N/A 06/29/2016   Procedure: COLONOSCOPY WITH PROPOFOL;  Surgeon: Earline Mayotte, MD;  Location: [facility] ENDOSCOPY;  Service: Endoscopy;  Laterality: N/A;   TONSILLECTOMY  1970   Family History  Problem Relation Age of Onset   Cancer Mother 97       Breast Cancer-Mastectomy, Also Abdominal cancer   Asthma Father    Diabetes Father    Social History   Socioeconomic History   Marital status: Married    Spouse  name: Not on file   Number of children: Not on file   Years of education: 14   Highest education level: Not on file  Occupational History   Occupation: Homemaker  Tobacco Use   Smoking status: Never   Smokeless tobacco: Never  Vaping Use   Vaping Use: Never used  Substance and Sexual Activity   Alcohol use: No   Drug use: No   Sexual activity: Not on file  Other Topics Concern   Not on file  Social History Narrative   Regular exercise-yes   Caffeine use-yes         Social Determinants of Health   Financial Resource Strain: Low Risk  (07/01/2022)   Overall Financial Resource Strain (CARDIA)    Difficulty of Paying Living Expenses: Not hard at all  Food Insecurity: No Food Insecurity (07/01/2022)   Hunger Vital Sign     Worried About Running Out of Food in the Last Year: Never true    Ran Out of Food in the Last Year: Never true  Transportation Needs: No Transportation Needs (07/01/2022)   PRAPARE - Administrator, Civil ServiceTransportation    Lack of Transportation (Medical): No    Lack of Transportation (Non-Medical): No  Physical Activity: Insufficiently Active (07/01/2022)   Exercise Vital Sign    Days of Exercise per Week: 3 days    Minutes of Exercise per Session: 30 min  Stress: No Stress Concern Present (07/01/2022)   [employer] - Occupational Stress Questionnaire    Feeling of Stress : Not at all  Social Connections: Unknown (07/01/2022)   Social Connection and Isolation Panel [NHANES]    Frequency of Communication with Friends and Family: More than three times a week    Frequency of Social Gatherings with Friends and Family: More than three times a week    Attends Religious Services: Not on Marketing executivefile    Active Member of Clubs or Organizations: Not on file    Attends BankerClub or Organization Meetings: Not on file    Marital Status: Married    Tobacco Counseling Counseling given: Not Answered   Clinical Intake:  Pre-visit preparation completed: Yes        Diabetes: No  How often do you need to have someone help you when you read instructions, pamphlets, or other written materials from your doctor or pharmacy?: 1 - Never    Interpreter Needed?: No      Activities of Daily Living    07/01/2022    8:23 AM  In your present state of health, do you have any difficulty performing the following activities:  Hearing? 0  Vision? 0  Difficulty concentrating or making decisions? 0  Walking or climbing stairs? 0  Dressing or bathing? 0  Doing errands, shopping? 0  Preparing Food and eating ? N  Using the Toilet? N  In the past six months, have you accidently leaked urine? N  Do you have problems with loss of bowel control? N  Managing your Medications? N  Managing your Finances? N   Housekeeping or managing your Housekeeping? N    Patient Care Team: Sherlene Shamsullo, Teresa L, MD as PCP - General (Internal Medicine) Sherlene Shamsullo, Teresa L, MD (Internal Medicine) Lemar LivingsByrnett, Merrily PewJeffrey W, MD (General Surgery) Rickard PatienceYu, Zhou, MD as Consulting Physician (Hematology and Oncology)  Indicate any recent Medical Services you may have received from other than [facility] providers in the past year (date may be approximate).     Assessment:   This is a routine wellness examination  for [facility].  I connected with  Rhonda Brock on 07/01/22 by a audio enabled telemedicine application and verified that I am speaking with the correct person using two identifiers.  Patient Location: Home  Provider Location: Office/Clinic  I discussed the limitations of evaluation and management by telemedicine. The patient expressed understanding and agreed to proceed.   Hearing/Vision screen Hearing Screening - Comments:: Patient is able to hear conversational tones without difficulty.  No issues reported.    Vision Screening - Comments:: Followed by [facility], Dr.Bryan Wears corrective lenses They have seen their ophthalmologist in the last 12 months.    Dietary issues and exercise activities discussed: Current Exercise Habits: Home exercise routine, Type of exercise: walking, Time (Minutes): 30, Frequency (Times/Week): 3, Weekly Exercise (Minutes/Week): 90, Intensity: Mild Regular diet   Goals Addressed             This Visit's Progress    Maintain Healthy Lifestyle       Stay active Healthy diet Stay hydrated       Depression Screen    07/01/2022    8:20 AM 10/25/2021    3:52 PM 09/20/2021    4:39 PM 06/23/2021   10:26 AM 06/17/2020    2:08 PM 05/09/2016    8:48 AM  PHQ 2/9 Scores  PHQ - 2 Score 0 0 2 0 0 0  PHQ- 9 Score   5       Fall Risk    07/01/2022    8:23 AM 10/25/2021    3:52 PM 09/20/2021    3:56 PM 06/23/2021   10:28 AM 06/17/2020    2:01 PM  Fall Risk   Falls in the past  year? 0 0 0 0 0  Number falls in past yr: 0   0   Injury with Fall? 0      Risk for fall due to :  No Fall Risks No Fall Risks    Follow up Falls evaluation completed;Falls prevention discussed Falls evaluation completed Falls evaluation completed Falls evaluation completed Falls evaluation completed    FALL RISK PREVENTION PERTAINING TO THE HOME: Home free of loose throw rugs in walkways, pet beds, electrical cords, etc? Yes  Adequate lighting in your home to reduce risk of falls? Yes   ASSISTIVE DEVICES UTILIZED TO PREVENT FALLS:  Life alert? No  Pull safety chords? Yes Use of a cane, walker or w/c? No  Grab bars in the bathroom? Yes  Shower chair or bench in shower? Yes  Elevated toilet seat or a handicapped toilet? No   TIMED UP AND GO: Was the test performed? No .   Cognitive Function:        07/01/2022    8:24 AM  6CIT Screen  What Year? 0 points  What month? 0 points  What time? 0 points  Count back from 20 0 points  Months in reverse 0 points  Repeat phrase 0 points  Total Score 0 points    Immunizations Immunization History  Administered Date(s) Administered   Hepatitis B, ADULT 05/17/2018, 06/19/2018, 11/16/2018   Influenza,inj,Quad PF,6+ Mos 01/11/2018, 12/31/2018, 01/03/2020   Influenza-Unspecified 12/29/2013, 01/15/2015   MMR 09/25/2000   Moderna Sars-Covid-2 Vaccination 04/12/2019, 05/10/2019, 02/11/2020   Tdap 03/29/2011   TDAP status: Due, Education has been provided regarding the importance of this vaccine. Advised may receive this vaccine at local pharmacy or Health Dept. Aware to provide a copy of the vaccination record if obtained from local pharmacy or Health Dept. Verbalized  acceptance and understanding.  Pneumococcal vaccine status: Due, Education has been provided regarding the importance of this vaccine. Advised may receive this vaccine at local pharmacy or Health Dept. Aware to provide a copy of the vaccination record if obtained from local  pharmacy or Health Dept. Verbalized acceptance and understanding.  Covid-19 vaccine status: Completed vaccines   Shingrix Completed?: No.    Education has been provided regarding the importance of this vaccine. Patient has been advised to call insurance company to determine out of pocket expense if they have not yet received this vaccine. Advised may also receive vaccine at local pharmacy or Health Dept. Verbalized acceptance and understanding.  Screening Tests Health Maintenance  Topic Date Due   DTaP/Tdap/Td (2 - Td or Tdap) 03/28/2021   COVID-19 Vaccine (4 - 2023-24 season) 07/17/2022 (Originally 11/26/2021)   Zoster Vaccines- Shingrix (1 of 2) 09/30/2022 (Originally 05/18/2005)   Pneumonia Vaccine 9065+ Years old (1 of 1 - PCV) 07/01/2023 (Originally 05/18/2020)   INFLUENZA VACCINE  10/27/2022   MAMMOGRAM  01/27/2023   Medicare Annual Wellness (AWV)  07/01/2023   COLONOSCOPY (Pts 45-7643yrs Insurance coverage will need to be confirmed)  06/30/2026   DEXA SCAN  Completed   Hepatitis C Screening  Completed   HPV VACCINES  Aged Out    Health Maintenance Health Maintenance Due  Topic Date Due   DTaP/Tdap/Td (2 - Td or Tdap) 03/28/2021   [facility].   Lung Cancer Screening: (Low Dose CT Chest recommended if Age 93-80 years, 30 pack-year currently smoking OR have quit w/in 15years.) does not qualify.   Hepatitis C Screening: Completed 05/01/2018.  Vision Screening: Recommended annual ophthalmology exams for early detection of glaucoma and other disorders of the eye.  Dental Screening: Recommended annual dental exams for proper oral hygiene  Community Resource Referral / Chronic Care Management: CRR required this visit?  No   CCM required this visit?  No      Plan:     I have personally reviewed and noted the following in the patient's chart:   Medical and social history Use of alcohol, tobacco or illicit drugs  Current  medications and supplements including opioid prescriptions. Patient is not currently taking opioid prescriptions. Functional ability and status Nutritional status Physical activity Advanced directives List of other physicians Hospitalizations, surgeries, and ER visits in previous 12 months Vitals Screenings to include cognitive, depression, and falls Referrals and appointments  In addition, I have reviewed and discussed with patient certain preventive protocols, quality metrics, and best practice recommendations. A written personalized care plan for preventive services as well as general preventive health recommendations were provided to patient.     Nazaiah Navarrete L Motley, LPN   1/6/10964/07/2022      I have reviewed the above information and agree with above.   Duncan Dulleresa Tullo, MD

## 2022-07-14 ENCOUNTER — Other Ambulatory Visit: Payer: Self-pay | Admitting: Internal Medicine

## 2022-07-16 ENCOUNTER — Other Ambulatory Visit: Payer: Self-pay | Admitting: Internal Medicine

## 2022-07-18 ENCOUNTER — Telehealth: Payer: Self-pay | Admitting: Internal Medicine

## 2022-07-18 NOTE — Telephone Encounter (Signed)
Pt came into the office to drop off her BP readings. Placed in provider folder 

## 2022-07-18 NOTE — Telephone Encounter (Signed)
Placed in yellow results folder.  °

## 2022-07-20 ENCOUNTER — Other Ambulatory Visit: Payer: Self-pay | Admitting: Internal Medicine

## 2022-07-20 ENCOUNTER — Other Ambulatory Visit (INDEPENDENT_AMBULATORY_CARE_PROVIDER_SITE_OTHER): Payer: Medicare Other

## 2022-07-20 DIAGNOSIS — I10 Essential (primary) hypertension: Secondary | ICD-10-CM

## 2022-07-20 MED ORDER — HYDROCHLOROTHIAZIDE 25 MG PO TABS: 25.0000 mg | ORAL_TABLET | Freq: Every day | ORAL | 1 refills | Status: DC

## 2022-07-20 NOTE — Telephone Encounter (Signed)
Pt is aware and lab appt has been scheduled for today.

## 2022-07-21 LAB — BASIC METABOLIC PANEL
BUN: 14 mg/dL (ref 6–23)
CO2: 30 mEq/L (ref 19–32)
Calcium: 9 mg/dL (ref 8.4–10.5)
Chloride: 101 mEq/L (ref 96–112)
Creatinine, Ser: 1.01 mg/dL (ref 0.40–1.20)
GFR: 57.74 mL/min — ABNORMAL LOW (ref >60.00)
Glucose, Bld: 96 mg/dL (ref 70–99)
Potassium: 3.5 mEq/L (ref 3.5–5.1)
Sodium: 140 mEq/L (ref 135–145)

## 2022-08-01 ENCOUNTER — Ambulatory Visit: Payer: Medicare Other | Admitting: Oncology

## 2022-08-01 ENCOUNTER — Inpatient Hospital Stay: Payer: Medicare Other | Attending: Oncology

## 2022-08-01 DIAGNOSIS — Z90710 Acquired absence of both cervix and uterus: Secondary | ICD-10-CM | POA: Insufficient documentation

## 2022-08-01 DIAGNOSIS — Z800 Family history of malignant neoplasm of digestive organs: Secondary | ICD-10-CM | POA: Diagnosis not present

## 2022-08-01 DIAGNOSIS — Z803 Family history of malignant neoplasm of breast: Secondary | ICD-10-CM | POA: Diagnosis not present

## 2022-08-01 DIAGNOSIS — E876 Hypokalemia: Secondary | ICD-10-CM | POA: Diagnosis not present

## 2022-08-01 LAB — CBC WITH DIFFERENTIAL/PLATELET
Abs Immature Granulocytes: 0.1 10*3/uL — ABNORMAL HIGH (ref 0.00–0.07)
Basophils Absolute: 0.1 10*3/uL (ref 0.0–0.1)
Basophils Relative: 1 %
Eosinophils Absolute: 0.3 10*3/uL (ref 0.0–0.5)
Eosinophils Relative: 3 %
HCT: 39.7 % (ref 36.0–46.0)
Hemoglobin: 13.8 g/dL (ref 12.0–15.0)
Immature Granulocytes: 1 %
Lymphocytes Relative: 20 %
Lymphs Abs: 2.1 10*3/uL (ref 0.7–4.0)
MCH: 32.9 pg (ref 26.0–34.0)
MCHC: 34.8 g/dL (ref 30.0–36.0)
MCV: 94.5 fL (ref 80.0–100.0)
Monocytes Absolute: 0.7 10*3/uL (ref 0.1–1.0)
Monocytes Relative: 7 %
Neutro Abs: 7.3 10*3/uL (ref 1.7–7.7)
Neutrophils Relative %: 68 %
Platelets: 314 10*3/uL (ref 150–400)
RBC: 4.2 MIL/uL (ref 3.87–5.11)
RDW: 12.7 % (ref 11.5–15.5)
WBC: 10.5 10*3/uL (ref 4.0–10.5)
nRBC: 0 % (ref 0.0–0.2)

## 2022-08-01 LAB — COMPREHENSIVE METABOLIC PANEL
ALT: 17 U/L (ref 0–44)
AST: 22 U/L (ref 15–41)
Albumin: 3.7 g/dL (ref 3.5–5.0)
Alkaline Phosphatase: 73 U/L (ref 38–126)
Anion gap: 11 (ref 5–15)
BUN: 19 mg/dL (ref 8–23)
CO2: 26 mmol/L (ref 22–32)
Calcium: 9.1 mg/dL (ref 8.9–10.3)
Chloride: 100 mmol/L (ref 98–111)
Creatinine, Ser: 0.86 mg/dL (ref 0.44–1.00)
GFR, Estimated: >60 mL/min (ref >60)
Glucose, Bld: 111 mg/dL — ABNORMAL HIGH (ref 70–99)
Potassium: 3.2 mmol/L — ABNORMAL LOW (ref 3.5–5.1)
Sodium: 137 mmol/L (ref 135–145)
Total Bilirubin: 0.8 mg/dL (ref 0.3–1.2)
Total Protein: 6.5 g/dL (ref 6.5–8.1)

## 2022-08-01 LAB — IRON AND TIBC
Iron: 161 ug/dL (ref 28–170)
Saturation Ratios: 59 % — ABNORMAL HIGH (ref 10.4–31.8)
TIBC: 274 ug/dL (ref 250–450)
UIBC: 113 ug/dL

## 2022-08-01 LAB — FERRITIN: Ferritin: 50 ng/mL (ref 11–307)

## 2022-08-03 ENCOUNTER — Inpatient Hospital Stay: Payer: Medicare Other | Admitting: Oncology

## 2022-08-03 ENCOUNTER — Inpatient Hospital Stay: Payer: Medicare Other

## 2022-08-03 ENCOUNTER — Encounter: Payer: Self-pay | Admitting: Oncology

## 2022-08-03 DIAGNOSIS — Z803 Family history of malignant neoplasm of breast: Secondary | ICD-10-CM | POA: Diagnosis not present

## 2022-08-03 DIAGNOSIS — Z800 Family history of malignant neoplasm of digestive organs: Secondary | ICD-10-CM | POA: Diagnosis not present

## 2022-08-03 DIAGNOSIS — E876 Hypokalemia: Secondary | ICD-10-CM | POA: Insufficient documentation

## 2022-08-03 MED ORDER — POTASSIUM CHLORIDE CRYS ER 20 MEQ PO TBCR: 20.0000 meq | EXTENDED_RELEASE_TABLET | Freq: Every day | ORAL | 0 refills | Status: DC

## 2022-08-03 NOTE — Patient Instructions (Signed)

## 2022-08-03 NOTE — Assessment & Plan Note (Addendum)
Homozygous hemochromatosis mutation/iron overload Labs reviewed and discussed with patient.  LFTs stable. Ferritin is at 50,  Isolated iron saturation elevation.  Recommend phlebotomy today. Avoid iron supplementation, alcohol vitamin C supplementation.

## 2022-08-03 NOTE — Progress Notes (Signed)
Hematology/Oncology Progress note [phone] [phone]      Patient Care Team: Sherlene Shams, MD as PCP - General (Internal Medicine) Sherlene Shams, MD (Internal Medicine) Lemar Livings Merrily Pew, MD (General Surgery) Rickard Patience, MD as Consulting Physician (Hematology and Oncology)  ASSESSMENT & PLAN:   Hemochromatosis, hereditary [facility]) Homozygous hemochromatosis mutation/iron overload Labs reviewed and discussed with patient.  LFTs stable. Ferritin is at 50,  Isolated iron saturation elevation.  Recommend phlebotomy today. Avoid iron supplementation, alcohol vitamin C supplementation.  Hypokalemia Recommend potassium chloride 20 mEq daily for 3 days.  Recommend patient to repeat potassium level in 4 to 6 weeks with primary care provider   Orders Placed This Encounter  Procedures   CBC with Differential (Cancer Center Only)    Standing Status:   Future    Standing Expiration Date:   08/03/2023   Iron and TIBC    Standing Status:   Future    Standing Expiration Date:   08/03/2023   Ferritin    Standing Status:   Future    Standing Expiration Date:   08/03/2023   Follow-up in 6 months All questions were answered. The patient knows to call the clinic with any problems, questions or concerns.  Rickard Patience, MD, PhD [facility] Hematology Oncology 08/03/2022   CHIEF COMPLAINTS/REASON FOR VISIT:  Patient presented to follow-up for management of hemochromatosis and iron overload.  HISTORY OF PRESENTING ILLNESS:  Rhonda Brock is a  67 y.o.  female with PMH listed below who was presents for follow up of hereditary hemochromatosis, homozygous C282Y mutation. #   She has followed up with gastroenterologist and has received hepatitis vaccination.  Was recommended no need for liver biopsy   INTERVAL HISTORY Rhonda Brock is a 67 y.o. female who has above history reviewed by me today presents for follow up visit for management of hereditary hemochromatosis with  iron overload. Patient reports feeling well.  She has no new complaints. Patient donated blood December 2023.  She recently went to blood bank and was denied for blood donation due to being on Proscar. She has no new complaints. . Review of Systems  Constitutional:  Negative for appetite change, chills, fatigue and fever.  HENT:   Negative for hearing loss and voice change.   Eyes:  Negative for eye problems.  Respiratory:  Negative for chest tightness and cough.   Cardiovascular:  Negative for chest pain.  Gastrointestinal:  Negative for abdominal distention, abdominal pain and blood in stool.  Endocrine: Negative for hot flashes.  Genitourinary:  Negative for difficulty urinating and frequency.   Musculoskeletal:  Negative for arthralgias.  Skin:  Negative for itching and rash.  Neurological:  Negative for extremity weakness.  Hematological:  Negative for adenopathy.  Psychiatric/Behavioral:  Negative for confusion.     MEDICAL HISTORY:  Past Medical History:  Diagnosis Date   Anxiety    Back pain    Chronic headaches    Cyst (solitary) of breast    bilaterally   GERD (gastroesophageal reflux disease)    Hemochromatosis, hereditary (HCC) 03/22/2018   Hypertension     SURGICAL HISTORY: Past Surgical History:  Procedure Laterality Date   ABDOMINAL HYSTERECTOMY  2001   APPENDECTOMY  1962   breast cysts     BREAST SURGERY  2004, 2005   left breast cyst drained x 2   CHOLECYSTECTOMY  06/2019   COLONOSCOPY  06/08/2006   COLONOSCOPY WITH PROPOFOL N/A 06/29/2016   Procedure: COLONOSCOPY WITH PROPOFOL;  Surgeon: Earline Mayotte, MD;  Location: [facility] ENDOSCOPY;  Service: Endoscopy;  Laterality: N/A;   TONSILLECTOMY  1970    SOCIAL HISTORY: Social History   Socioeconomic History   Marital status: Married    Spouse name: Not on file   Number of children: Not on file   Years of education: 14   Highest education level: Not on file  Occupational History   Occupation:  Homemaker  Tobacco Use   Smoking status: Never   Smokeless tobacco: Never  Vaping Use   Vaping Use: Never used  Substance and Sexual Activity   Alcohol use: No   Drug use: No   Sexual activity: Not on file  Other Topics Concern   Not on file  Social History Narrative   Regular exercise-yes   Caffeine use-yes         Social Determinants of Health   Financial Resource Strain: Low Risk  (07/01/2022)   Overall Financial Resource Strain (CARDIA)    Difficulty of Paying Living Expenses: Not hard at all  Food Insecurity: No Food Insecurity (07/01/2022)   Hunger Vital Sign    Worried About Running Out of Food in the Last Year: Never true    Ran Out of Food in the Last Year: Never true  Transportation Needs: No Transportation Needs (07/01/2022)   PRAPARE - Administrator, Civil Service (Medical): No    Lack of Transportation (Non-Medical): No  Physical Activity: Insufficiently Active (07/01/2022)   Exercise Vital Sign    Days of Exercise per Week: 3 days    Minutes of Exercise per Session: 30 min  Stress: No Stress Concern Present (07/01/2022)   [employer] of Occupational Health - Occupational Stress Questionnaire    Feeling of Stress : Not at all  Social Connections: Unknown (07/01/2022)   Social Connection and Isolation Panel [NHANES]    Frequency of Communication with Friends and Family: More than three times a week    Frequency of Social Gatherings with Friends and Family: More than three times a week    Attends Religious Services: Not on file    Active Member of Clubs or Organizations: Not on file    Attends Banker Meetings: Not on file    Marital Status: Married  Intimate Partner Violence: Not At Risk (07/01/2022)   Humiliation, Afraid, Rape, and Kick questionnaire    Fear of Current or Ex-Partner: No    Emotionally Abused: No    Physically Abused: No    Sexually Abused: No    FAMILY HISTORY: Family History  Problem Relation Age of Onset    Cancer Mother 44       Breast Cancer-Mastectomy, Also Abdominal cancer   Asthma Father    Diabetes Father     ALLERGIES:  has No Known Allergies.  MEDICATIONS:  Current Outpatient Medications  Medication Sig Dispense Refill   calcium carbonate (TUMS - DOSED IN MG ELEMENTAL CALCIUM) 500 MG chewable tablet Chew 1 tablet by mouth daily.     finasteride (PROSCAR) 5 MG tablet Take 2.5 mg by mouth daily.     hydrochlorothiazide (HYDRODIURIL) 25 MG tablet Take 1 tablet (25 mg total) by mouth daily. 90 tablet 1   potassium chloride SA (KLOR-CON M) 20 MEQ tablet Take 1 tablet (20 mEq total) by mouth daily. 3 tablet 0   sertraline (ZOLOFT) 50 MG tablet TAKE 1 TABLET (50 MG TOTAL) BY MOUTH DAILY. ` 90 tablet 1   telmisartan (MICARDIS) 20 MG tablet Take  1 tablet (20 mg total) by mouth daily. 90 tablet 1   meloxicam (MOBIC) 7.5 MG tablet TAKE 1 TABLET BY MOUTH DAILY AS NEEDED FOR PAIN (Patient not taking: Reported on 08/03/2022) 30 tablet 1   No current facility-administered medications for this visit.     PHYSICAL EXAMINATION: ECOG PERFORMANCE STATUS: 0 - Asymptomatic Vitals:   08/03/22 1302  BP: 131/81  Pulse: 73  Resp: 18  Temp: 97.7 F (36.5 C)  SpO2: 96%   Filed Weights   08/03/22 1302  Weight: 204 lb 6.4 oz (92.7 kg)    Physical Exam Constitutional:      General: She is not in acute distress. HENT:     Head: Normocephalic and atraumatic.  Eyes:     General: No scleral icterus.    Pupils: Pupils are equal, round, and reactive to light.  Cardiovascular:     Rate and Rhythm: Normal rate and regular rhythm.     Heart sounds: Normal heart sounds.  Pulmonary:     Effort: Pulmonary effort is normal. No respiratory distress.     Breath sounds: No wheezing.  Abdominal:     General: Bowel sounds are normal. There is no distension.     Palpations: Abdomen is soft. There is no mass.     Tenderness: There is no abdominal tenderness.  Musculoskeletal:        General: No deformity.  Normal range of motion.     Cervical back: Normal range of motion and neck supple.  Skin:    General: Skin is warm and dry.     Findings: No erythema or rash.  Neurological:     Mental Status: She is alert and oriented to person, place, and time. Mental status is at baseline.     Cranial Nerves: No cranial nerve deficit.     Coordination: Coordination normal.  Psychiatric:        Mood and Affect: Mood normal.      LABORATORY DATA:  I have reviewed the data as listed Lab Results  Component Value Date   WBC 10.5 08/01/2022   HGB 13.8 08/01/2022   HCT 39.7 08/01/2022   MCV 94.5 08/01/2022   PLT 314 08/01/2022   Recent Labs    09/20/21 1629 01/28/22 1308 07/20/22 1412 08/01/22 1235  NA 138  --  140 137  K 3.7  --  3.5 3.2*  CL 100  --  101 100  CO2 30  --  30 26  GLUCOSE 89  --  96 111*  BUN 13  --  14 19  CREATININE 0.90  --  1.01 0.86  CALCIUM 9.3  --  9.0 9.1  GFRNONAA  --   --   --  >60  PROT 6.4 7.1  --  6.5  ALBUMIN 4.1 4.0  --  3.7  AST 18 24  --  22  ALT 15 17  --  17  ALKPHOS 63 71  --  73  BILITOT 0.6 0.7  --  0.8  BILIDIR  --  0.1  --   --   IBILI  --  0.6  --   --     Lab Results  Component Value Date   IRON 161 08/01/2022   TIBC 274 08/01/2022   IRONPCTSAT 59 (H) 08/01/2022   FERRITIN 50 08/01/2022        RADIOGRAPHIC STUDIES: I have personally reviewed the radiological images as listed and agreed with the findings in the report. No results found.

## 2022-08-03 NOTE — Assessment & Plan Note (Signed)
Recommend potassium chloride 20 mEq daily for 3 days.  Recommend patient to repeat potassium level in 4 to 6 weeks with primary care provider

## 2022-08-29 ENCOUNTER — Encounter: Payer: Self-pay | Admitting: Internal Medicine

## 2022-08-29 DIAGNOSIS — E876 Hypokalemia: Secondary | ICD-10-CM

## 2022-08-29 NOTE — Telephone Encounter (Signed)
I have pended bmet for your approval. If okay I will reach out to pt to schedule a lab appt.

## 2022-09-07 ENCOUNTER — Other Ambulatory Visit (INDEPENDENT_AMBULATORY_CARE_PROVIDER_SITE_OTHER): Payer: Medicare Other

## 2022-09-07 DIAGNOSIS — E876 Hypokalemia: Secondary | ICD-10-CM | POA: Diagnosis not present

## 2022-09-07 LAB — BASIC METABOLIC PANEL
BUN: 11 mg/dL (ref 6–23)
CO2: 29 mEq/L (ref 19–32)
Calcium: 9.2 mg/dL (ref 8.4–10.5)
Chloride: 99 mEq/L (ref 96–112)
Creatinine, Ser: 0.88 mg/dL (ref 0.40–1.20)
GFR: 68.06 mL/min (ref >60.00)
Glucose, Bld: 121 mg/dL — ABNORMAL HIGH (ref 70–99)
Potassium: 3.6 mEq/L (ref 3.5–5.1)
Sodium: 137 mEq/L (ref 135–145)

## 2022-10-09 ENCOUNTER — Other Ambulatory Visit: Payer: Self-pay | Admitting: Internal Medicine

## 2022-10-10 NOTE — Telephone Encounter (Signed)
Attempted to call pt. No answer. Needs to schedule an appt for a refill.

## 2022-10-13 ENCOUNTER — Other Ambulatory Visit: Payer: Self-pay | Admitting: Internal Medicine

## 2022-10-13 NOTE — Telephone Encounter (Signed)
Pt has been scheduled for 12-21-22 ger lov was 10-25-21 refill sent in

## 2022-11-10 ENCOUNTER — Encounter (INDEPENDENT_AMBULATORY_CARE_PROVIDER_SITE_OTHER): Payer: Self-pay

## 2022-12-21 ENCOUNTER — Encounter: Payer: Self-pay | Admitting: Internal Medicine

## 2022-12-21 ENCOUNTER — Ambulatory Visit: Payer: Medicare Other | Admitting: Internal Medicine

## 2022-12-21 VITALS — BP 110/78 | HR 76 | Temp 98.2°F | Ht 65.0 in | Wt 201.8 lb

## 2022-12-21 DIAGNOSIS — R61 Generalized hyperhidrosis: Secondary | ICD-10-CM | POA: Diagnosis not present

## 2022-12-21 DIAGNOSIS — E669 Obesity, unspecified: Secondary | ICD-10-CM

## 2022-12-21 DIAGNOSIS — Z1231 Encounter for screening mammogram for malignant neoplasm of breast: Secondary | ICD-10-CM | POA: Diagnosis not present

## 2022-12-21 DIAGNOSIS — I10 Essential (primary) hypertension: Secondary | ICD-10-CM | POA: Diagnosis not present

## 2022-12-21 DIAGNOSIS — E782 Mixed hyperlipidemia: Secondary | ICD-10-CM | POA: Diagnosis not present

## 2022-12-21 DIAGNOSIS — F411 Generalized anxiety disorder: Secondary | ICD-10-CM

## 2022-12-21 LAB — COMPREHENSIVE METABOLIC PANEL
ALT: 14 U/L (ref 0–35)
AST: 16 U/L (ref 0–37)
Albumin: 4 g/dL (ref 3.5–5.2)
Alkaline Phosphatase: 68 U/L (ref 39–117)
BUN: 14 mg/dL (ref 6–23)
CO2: 31 mEq/L (ref 19–32)
Calcium: 9.7 mg/dL (ref 8.4–10.5)
Chloride: 101 mEq/L (ref 96–112)
Creatinine, Ser: 0.85 mg/dL (ref 0.40–1.20)
GFR: 70.81 mL/min (ref >60.00)
Glucose, Bld: 101 mg/dL — ABNORMAL HIGH (ref 70–99)
Potassium: 3.4 mEq/L — ABNORMAL LOW (ref 3.5–5.1)
Sodium: 140 mEq/L (ref 135–145)
Total Bilirubin: 0.7 mg/dL (ref 0.2–1.2)
Total Protein: 6.8 g/dL (ref 6.0–8.3)

## 2022-12-21 LAB — CBC WITH DIFFERENTIAL/PLATELET
Basophils Absolute: 0.1 10*3/uL (ref 0.0–0.1)
Basophils Relative: 1 % (ref 0.0–3.0)
Eosinophils Absolute: 0.3 10*3/uL (ref 0.0–0.7)
Eosinophils Relative: 3 % (ref 0.0–5.0)
HCT: 42 % (ref 36.0–46.0)
Hemoglobin: 14.2 g/dL (ref 12.0–15.0)
Lymphocytes Relative: 21.4 % (ref 12.0–46.0)
Lymphs Abs: 2.1 10*3/uL (ref 0.7–4.0)
MCHC: 33.8 g/dL (ref 30.0–36.0)
MCV: 94.9 fl (ref 78.0–100.0)
Monocytes Absolute: 0.7 10*3/uL (ref 0.1–1.0)
Monocytes Relative: 7.1 % (ref 3.0–12.0)
Neutro Abs: 6.5 10*3/uL (ref 1.4–7.7)
Neutrophils Relative %: 67.5 % (ref 43.0–77.0)
Platelets: 363 10*3/uL (ref 150.0–400.0)
RBC: 4.42 Mil/uL (ref 3.87–5.11)
RDW: 13.2 % (ref 11.5–15.5)
WBC: 9.6 10*3/uL (ref 4.0–10.5)

## 2022-12-21 LAB — LDL CHOLESTEROL, DIRECT: Direct LDL: 115 mg/dL

## 2022-12-21 LAB — LIPID PANEL
Cholesterol: 183 mg/dL (ref 0–200)
HDL: 51.1 mg/dL (ref >39.00)
LDL Cholesterol: 79 mg/dL (ref 0–99)
NonHDL: 131.82
Total CHOL/HDL Ratio: 4
Triglycerides: 265 mg/dL — ABNORMAL HIGH (ref 0.0–149.0)
VLDL: 53 mg/dL — ABNORMAL HIGH (ref 0.0–40.0)

## 2022-12-21 LAB — MICROALBUMIN / CREATININE URINE RATIO
Creatinine,U: 130.4 mg/dL
Microalb Creat Ratio: 0.5 mg/g (ref 0.0–30.0)
Microalb, Ur: <0.7 mg/dL (ref 0.0–1.9)

## 2022-12-21 LAB — HEMOGLOBIN A1C: Hgb A1c MFr Bld: 5.8 % (ref 4.6–6.5)

## 2022-12-21 LAB — TSH: TSH: 1.57 u[IU]/mL (ref 0.35–5.50)

## 2022-12-21 MED ORDER — HYDROCHLOROTHIAZIDE 25 MG PO TABS: 25.0000 mg | ORAL_TABLET | Freq: Every day | ORAL | 1 refills | Status: DC

## 2022-12-21 MED ORDER — TELMISARTAN 20 MG PO TABS: 20.0000 mg | ORAL_TABLET | Freq: Every day | ORAL | 1 refills | Status: DC

## 2022-12-21 NOTE — Patient Instructions (Addendum)
Your blood pressure is excellent!  Remember to suspend hydrochlorothiazide during any viral illness that causes vomiting or diarrhea to avoid dehydration and hyponatremia  For your knee pain :  You can take  2000 mg of acetominophen (tylenol) every day safely  In divided doses (500 mg every 6 hours  Or 1000 mg every 12 hours.)    The Tdap (tetanus-diphtheria-whooping cough vaccine ) is due.  And COVERED BY MEDICARE if you get them at your pharmacy as of  January 1.

## 2022-12-21 NOTE — Assessment & Plan Note (Addendum)
She previously reported waking up with palpitations and having decreased concentration due to worrying bout her husband Rhonda Brock who has transitioned to [employer]  .  Much improved with use of sertraline  50 mg .   no changes today

## 2022-12-21 NOTE — Progress Notes (Signed)
Subjective:  Patient ID: Rhonda Brock, female    DOB: 1955/07/24  Age: 67 y.o. MRN: [id number]  CC: The primary encounter diagnosis was Hypertension, essential. Diagnoses of Mixed hyperlipidemia, Obesity (BMI 30-39.9), Night sweats, Breast cancer screening by mammogram, Anxiety state, and Hemochromatosis, hereditary (HCC) were also pertinent to this visit.   HPI Coda Misko presents for  Chief Complaint  Patient presents with   Medical Management of Chronic Issues    1) Hypertension: patient checks blood pressure weekly at home.  Readings have been  <130/80 at rest . Patient is following a reduced salt diet most days and is taking medications as prescribed  (hydrochlorothiazide and telmisartan)  2) alopecia: taking proscar  prescribed by dermatology  for the past year.  No change .  Thinning on the crown.   3) left knee pain:  only at rest in supine position,  not waking her up at night ,  no swelling,  occasionally stabbing pain with use  4) Hereditary hemochromatosis;  previously managed with periodic blood donation.  Can no longer  donate blood anymore due to use of proscar.  Getting phlebotomized regularly  5) GAD:  doing well on sertraline.  Wakes up and has a few moments of anxiety but the feeling is gone by brekfast,  no panic attacks.Marland Kitchen  average 8-9 hours of sleep.    Outpatient Medications Prior to Visit  Medication Sig Dispense Refill   calcium carbonate (TUMS - DOSED IN MG ELEMENTAL CALCIUM) 500 MG chewable tablet Chew 1 tablet by mouth daily.     finasteride (PROSCAR) 5 MG tablet Take 2.5 mg by mouth daily.     meloxicam (MOBIC) 7.5 MG tablet TAKE 1 TABLET BY MOUTH DAILY AS NEEDED FOR PAIN 30 tablet 1   sertraline (ZOLOFT) 50 MG tablet TAKE 1 TABLET (50 MG TOTAL) BY MOUTH DAILY. ` 90 tablet 1   hydrochlorothiazide (HYDRODIURIL) 25 MG tablet Take 1 tablet (25 mg total) by mouth daily. 90 tablet 1   telmisartan (MICARDIS) 20 MG tablet TAKE 1 TABLET BY MOUTH EVERY DAY  30 tablet 0   potassium chloride SA (KLOR-CON M) 20 MEQ tablet Take 1 tablet (20 mEq total) by mouth daily. (Patient not taking: Reported on 12/21/2022) 3 tablet 0   No facility-administered medications prior to visit.    Review of Systems;  Patient denies headache, fevers, malaise, unintentional weight loss, skin rash, eye pain, sinus congestion and sinus pain, sore throat, dysphagia,  hemoptysis , cough, dyspnea, wheezing, chest pain, palpitations, orthopnea, edema, abdominal pain, nausea, melena, diarrhea, constipation, flank pain, dysuria, hematuria, urinary  Frequency, nocturia, numbness, tingling, seizures,  Focal weakness, Loss of consciousness,  Tremor, insomnia, depression, anxiety, and suicidal ideation.      Objective:  BP 110/78   Pulse 76   Temp 98.2 F (36.8 C) (Oral)   Ht 5\' 5"  (1.651 m)   Wt 201 lb 12.8 oz (91.5 kg)   SpO2 96%   BMI 33.58 kg/m   BP Readings from Last 3 Encounters:  12/21/22 110/78  08/03/22 127/83  08/03/22 131/81    Wt Readings from Last 3 Encounters:  12/21/22 201 lb 12.8 oz (91.5 kg)  08/03/22 204 lb 6.4 oz (92.7 kg)  07/01/22 203 lb (92.1 kg)    Physical Exam Vitals reviewed.  Constitutional:      General: She is not in acute distress.    Appearance: Normal appearance. She is normal weight. She is not ill-appearing, toxic-appearing or diaphoretic.  HENT:  Head: Normocephalic and atraumatic.  Eyes:     General: No scleral icterus.       Right eye: No discharge.        Left eye: No discharge.     Conjunctiva/sclera: Conjunctivae normal.  Cardiovascular:     Rate and Rhythm: Normal rate and regular rhythm.     Heart sounds: Normal heart sounds.  Pulmonary:     Effort: Pulmonary effort is normal. No respiratory distress.     Breath sounds: Normal breath sounds.  Musculoskeletal:        General: Normal range of motion.  Skin:    General: Skin is warm and dry.  Neurological:     General: No focal deficit present.     Mental  Status: She is alert and oriented to person, place, and time. Mental status is at baseline.  Psychiatric:        Mood and Affect: Mood normal.        Behavior: Behavior normal.        Thought Content: Thought content normal.        Judgment: Judgment normal.    Lab Results  Component Value Date   HGBA1C 5.8 12/21/2022   HGBA1C 5.6 06/11/2020   HGBA1C 5.6 02/19/2018    Lab Results  Component Value Date   CREATININE 0.85 12/21/2022   CREATININE 0.88 09/07/2022   CREATININE 0.86 08/01/2022    Lab Results  Component Value Date   WBC 9.6 12/21/2022   HGB 14.2 12/21/2022   HCT 42.0 12/21/2022   PLT 363.0 12/21/2022   GLUCOSE 101 (H) 12/21/2022   CHOL 183 12/21/2022   TRIG 265.0 (H) 12/21/2022   HDL 51.10 12/21/2022   LDLDIRECT 115.0 12/21/2022   LDLCALC 79 12/21/2022   ALT 14 12/21/2022   AST 16 12/21/2022   NA 140 12/21/2022   K 3.4 (L) 12/21/2022   CL 101 12/21/2022   CREATININE 0.85 12/21/2022   BUN 14 12/21/2022   CO2 31 12/21/2022   TSH 1.57 12/21/2022   HGBA1C 5.8 12/21/2022   MICROALBUR <0.7 12/21/2022    No results found.  Assessment & Plan:  .Hypertension, essential Assessment & Plan: Well controlled on current regimen of hctz 25 mg daily.     Reminded to suspend medication during any GI illness that would lead to dehydration  And to report any recurrent muscle cramping  Orders: -     Comprehensive metabolic panel -     Microalbumin / creatinine urine ratio  Mixed hyperlipidemia -     Lipid panel -     LDL cholesterol, direct -     Hemoglobin A1c  Obesity (BMI 30-39.9) -     Comprehensive metabolic panel -     TSH -     Hemoglobin A1c  Night sweats -     CBC with Differential/Platelet -     DG Chest 2 View; Future  Breast cancer screening by mammogram -     Digital Screening Mammogram, Left and Right; Future  Anxiety state Assessment & Plan: She previously reported waking up with palpitations and having decreased concentration due to  worrying bout her husband [name] who has transitioned to [employer]"  .  Much improved with use of sertraline  50 mg .   no changes today    Hemochromatosis, hereditary (HCC) Assessment & Plan: Managed with phlebotomy    Other orders -     hydroCHLOROthiazide; Take 1 tablet (25 mg total) by mouth daily.  Dispense: 90  tablet; Refill: 1 -     Telmisartan; Take 1 tablet (20 mg total) by mouth daily.  Dispense: 90 tablet; Refill: 1    Follow-up: No follow-ups on file.   Sherlene Shams, MD

## 2022-12-22 NOTE — Assessment & Plan Note (Signed)
Managed with phlebotomy

## 2022-12-22 NOTE — Assessment & Plan Note (Signed)
Well controlled on current regimen of hctz 25 mg daily.     Reminded to suspend medication during any GI illness that would lead to dehydration  And to report any recurrent muscle cramping

## 2022-12-23 NOTE — Telephone Encounter (Signed)
Chart has been updated.

## 2022-12-24 MED ORDER — POTASSIUM CHLORIDE CRYS ER 20 MEQ PO TBCR: 20.0000 meq | EXTENDED_RELEASE_TABLET | Freq: Every day | ORAL | 3 refills | Status: DC

## 2022-12-24 NOTE — Addendum Note (Signed)
Addended by: Sherlene Shams on: 12/24/2022 11:49 PM   Modules accepted: Orders

## 2023-01-02 DIAGNOSIS — Z9889 Other specified postprocedural states: Secondary | ICD-10-CM | POA: Diagnosis not present

## 2023-01-02 DIAGNOSIS — H40019 Open angle with borderline findings, low risk, unspecified eye: Secondary | ICD-10-CM | POA: Diagnosis not present

## 2023-01-02 DIAGNOSIS — H25093 Other age-related incipient cataract, bilateral: Secondary | ICD-10-CM | POA: Diagnosis not present

## 2023-01-02 DIAGNOSIS — H43812 Vitreous degeneration, left eye: Secondary | ICD-10-CM | POA: Diagnosis not present

## 2023-01-05 DIAGNOSIS — D2272 Melanocytic nevi of left lower limb, including hip: Secondary | ICD-10-CM | POA: Diagnosis not present

## 2023-01-05 DIAGNOSIS — L821 Other seborrheic keratosis: Secondary | ICD-10-CM | POA: Diagnosis not present

## 2023-01-05 DIAGNOSIS — D225 Melanocytic nevi of trunk: Secondary | ICD-10-CM | POA: Diagnosis not present

## 2023-01-05 DIAGNOSIS — L659 Nonscarring hair loss, unspecified: Secondary | ICD-10-CM | POA: Diagnosis not present

## 2023-01-05 DIAGNOSIS — D2261 Melanocytic nevi of right upper limb, including shoulder: Secondary | ICD-10-CM | POA: Diagnosis not present

## 2023-01-05 DIAGNOSIS — D2271 Melanocytic nevi of right lower limb, including hip: Secondary | ICD-10-CM | POA: Diagnosis not present

## 2023-01-05 DIAGNOSIS — D2262 Melanocytic nevi of left upper limb, including shoulder: Secondary | ICD-10-CM | POA: Diagnosis not present

## 2023-01-05 DIAGNOSIS — D170 Benign lipomatous neoplasm of skin and subcutaneous tissue of head, face and neck: Secondary | ICD-10-CM | POA: Diagnosis not present

## 2023-01-05 DIAGNOSIS — L7211 Pilar cyst: Secondary | ICD-10-CM | POA: Diagnosis not present

## 2023-01-11 ENCOUNTER — Encounter: Payer: Self-pay | Admitting: Internal Medicine

## 2023-01-20 ENCOUNTER — Encounter: Payer: Self-pay | Admitting: Internal Medicine

## 2023-01-31 ENCOUNTER — Inpatient Hospital Stay: Payer: Medicare Other

## 2023-01-31 ENCOUNTER — Inpatient Hospital Stay: Payer: Medicare Other | Attending: Oncology

## 2023-01-31 DIAGNOSIS — Z803 Family history of malignant neoplasm of breast: Secondary | ICD-10-CM | POA: Diagnosis not present

## 2023-01-31 LAB — CBC WITH DIFFERENTIAL (CANCER CENTER ONLY)
Abs Immature Granulocytes: 0.1 10*3/uL — ABNORMAL HIGH (ref 0.00–0.07)
Basophils Absolute: 0.1 10*3/uL (ref 0.0–0.1)
Basophils Relative: 1 %
Eosinophils Absolute: 0.3 10*3/uL (ref 0.0–0.5)
Eosinophils Relative: 3 %
HCT: 38.6 % (ref 36.0–46.0)
Hemoglobin: 13.6 g/dL (ref 12.0–15.0)
Immature Granulocytes: 1 %
Lymphocytes Relative: 23 %
Lymphs Abs: 2.1 10*3/uL (ref 0.7–4.0)
MCH: 32.9 pg (ref 26.0–34.0)
MCHC: 35.2 g/dL (ref 30.0–36.0)
MCV: 93.2 fL (ref 80.0–100.0)
Monocytes Absolute: 0.7 10*3/uL (ref 0.1–1.0)
Monocytes Relative: 8 %
Neutro Abs: 6 10*3/uL (ref 1.7–7.7)
Neutrophils Relative %: 64 %
Platelet Count: 304 10*3/uL (ref 150–400)
RBC: 4.14 MIL/uL (ref 3.87–5.11)
RDW: 12.7 % (ref 11.5–15.5)
WBC Count: 9.3 10*3/uL (ref 4.0–10.5)
nRBC: 0 % (ref 0.0–0.2)

## 2023-01-31 LAB — IRON AND TIBC
Iron: 136 ug/dL (ref 28–170)
Saturation Ratios: 49 % — ABNORMAL HIGH (ref 10.4–31.8)
TIBC: 277 ug/dL (ref 250–450)
UIBC: 141 ug/dL

## 2023-01-31 LAB — FERRITIN: Ferritin: 57 ng/mL (ref 11–307)

## 2023-02-02 ENCOUNTER — Encounter: Payer: Self-pay | Admitting: Oncology

## 2023-02-02 ENCOUNTER — Inpatient Hospital Stay: Payer: Medicare Other | Admitting: Oncology

## 2023-02-02 ENCOUNTER — Inpatient Hospital Stay: Payer: Medicare Other

## 2023-02-02 DIAGNOSIS — Z803 Family history of malignant neoplasm of breast: Secondary | ICD-10-CM | POA: Diagnosis not present

## 2023-02-02 NOTE — Progress Notes (Signed)
Hematology/Oncology Progress note [phone] [phone]      Patient Care Team: Sherlene Shams, MD as PCP - General (Internal Medicine) Sherlene Shams, MD (Internal Medicine) Lemar Livings Merrily Pew, MD (General Surgery) Rickard Patience, MD as Consulting Physician (Hematology and Oncology)  ASSESSMENT & PLAN:   Hemochromatosis, hereditary [name] Homozygous hemochromatosis mutation/iron overload Labs reviewed and discussed with patient.  LFTs stable. Ferritin is > 50,  iron saturation elevation.  Recommend phlebotomy today. Avoid iron supplementation, alcohol vitamin C supplementation.   Orders Placed This Encounter  Procedures   CBC with Differential (Cancer Center Only)    Standing Status:   Future    Standing Expiration Date:   02/02/2024   Iron and TIBC    Standing Status:   Future    Standing Expiration Date:   02/02/2024   Ferritin    Standing Status:   Future    Standing Expiration Date:   02/02/2024   Hepatic function panel    Standing Status:   Future    Standing Expiration Date:   02/02/2024   Follow-up in 6 months All questions were answered. The patient knows to call the clinic with any problems, questions or concerns.  Rickard Patience, MD, PhD [facility] Hematology Oncology 02/02/2023   CHIEF COMPLAINTS/REASON FOR VISIT:  Patient presented to follow-up for management of hemochromatosis and iron overload.  HISTORY OF PRESENTING ILLNESS:  Rhonda Brock is a  67 y.o.  female with PMH listed below who was presents for follow up of hereditary hemochromatosis, homozygous C282Y mutation. #   She has followed up with gastroenterologist and has received hepatitis vaccination.  Was recommended no need for liver biopsy   INTERVAL HISTORY Rhonda Brock is a 66 y.o. female who has above history reviewed by me today presents for follow up visit for management of hereditary hemochromatosis with iron overload. Patient reports feeling well.  She has no new  complaints. Patient donated blood December 2023.  She recently went to blood bank and was denied for blood donation due to being on Proscar for alopecia She has no new complaints. . Review of Systems  Constitutional:  Negative for appetite change, chills, fatigue and fever.  HENT:   Negative for hearing loss and voice change.        Hair loss  Eyes:  Negative for eye problems.  Respiratory:  Negative for chest tightness and cough.   Cardiovascular:  Negative for chest pain.  Gastrointestinal:  Negative for abdominal distention, abdominal pain and blood in stool.  Endocrine: Negative for hot flashes.  Genitourinary:  Negative for difficulty urinating and frequency.   Musculoskeletal:  Negative for arthralgias.  Skin:  Negative for itching and rash.  Neurological:  Negative for extremity weakness.  Hematological:  Negative for adenopathy.  Psychiatric/Behavioral:  Negative for confusion.     MEDICAL HISTORY:  Past Medical History:  Diagnosis Date   Anxiety    Back pain    Chronic headaches    Cyst (solitary) of breast    bilaterally   GERD (gastroesophageal reflux disease)    Hemochromatosis, hereditary (HCC) 03/22/2018   Hypertension     SURGICAL HISTORY: Past Surgical History:  Procedure Laterality Date   ABDOMINAL HYSTERECTOMY  2001   APPENDECTOMY  1962   breast cysts     BREAST SURGERY  2004, 2005   left breast cyst drained x 2   CHOLECYSTECTOMY  06/2019   COLONOSCOPY  06/08/2006   COLONOSCOPY WITH PROPOFOL N/A 06/29/2016  Procedure: COLONOSCOPY WITH PROPOFOL;  Surgeon: Earline Mayotte, MD;  Location: [facility];  Service: Endoscopy;  Laterality: N/A;   TONSILLECTOMY  1970    SOCIAL HISTORY: Social History   Socioeconomic History   Marital status: Married    Spouse name: Not on file   Number of children: Not on file   Years of education: 14   Highest education level: Not on file  Occupational History   Occupation: Homemaker  Tobacco Use   Smoking  status: Never   Smokeless tobacco: Never  Vaping Use   Vaping status: Never Used  Substance and Sexual Activity   Alcohol use: No   Drug use: No   Sexual activity: Not on file  Other Topics Concern   Not on file  Social History Narrative   Regular exercise-yes   Caffeine use-yes         Social Determinants of Health   Financial Resource Strain: Low Risk  (07/01/2022)   Overall Financial Resource Strain (CARDIA)    Difficulty of Paying Living Expenses: Not hard at all  Food Insecurity: No Food Insecurity (07/01/2022)   Hunger Vital Sign    Worried About Running Out of Food in the Last Year: Never true    Ran Out of Food in the Last Year: Never true  Transportation Needs: No Transportation Needs (07/01/2022)   PRAPARE - Administrator, Civil Service (Medical): No    Lack of Transportation (Non-Medical): No  Physical Activity: Insufficiently Active (07/01/2022)   Exercise Vital Sign    Days of Exercise per Week: 3 days    Minutes of Exercise per Session: 30 min  Stress: No Stress Concern Present (07/01/2022)   [employer] of Occupational Health - Occupational Stress Questionnaire    Feeling of Stress : Not at all  Social Connections: Unknown (07/01/2022)   Social Connection and Isolation Panel [NHANES]    Frequency of Communication with Friends and Family: More than three times a week    Frequency of Social Gatherings with Friends and Family: More than three times a week    Attends Religious Services: Not on file    Active Member of Clubs or Organizations: Not on file    Attends Banker Meetings: Not on file    Marital Status: Married  Intimate Partner Violence: Not At Risk (07/01/2022)   Humiliation, Afraid, Rape, and Kick questionnaire    Fear of Current or Ex-Partner: No    Emotionally Abused: No    Physically Abused: No    Sexually Abused: No    FAMILY HISTORY: Family History  Problem Relation Age of Onset   Cancer Mother 65       Breast  Cancer-Mastectomy, Also Abdominal cancer   Asthma Father    Diabetes Father     ALLERGIES:  has No Known Allergies.  MEDICATIONS:  Current Outpatient Medications  Medication Sig Dispense Refill   calcium carbonate (TUMS - DOSED IN MG ELEMENTAL CALCIUM) 500 MG chewable tablet Chew 1 tablet by mouth daily.     finasteride (PROSCAR) 5 MG tablet Take 2.5 mg by mouth daily.     hydrochlorothiazide (HYDRODIURIL) 25 MG tablet Take 1 tablet (25 mg total) by mouth daily. 90 tablet 1   potassium chloride SA (KLOR-CON M) 20 MEQ tablet Take 1 tablet (20 mEq total) by mouth daily. 30 tablet 3   sertraline (ZOLOFT) 50 MG tablet TAKE 1 TABLET (50 MG TOTAL) BY MOUTH DAILY. ` 90 tablet 1   telmisartan (  MICARDIS) 20 MG tablet Take 1 tablet (20 mg total) by mouth daily. 90 tablet 1   meloxicam (MOBIC) 7.5 MG tablet TAKE 1 TABLET BY MOUTH DAILY AS NEEDED FOR PAIN (Patient not taking: Reported on 02/02/2023) 30 tablet 1   No current facility-administered medications for this visit.     PHYSICAL EXAMINATION: ECOG PERFORMANCE STATUS: 0 - Asymptomatic Vitals:   02/02/23 1415  BP: 115/67  Pulse: 64  Resp: 18  Temp: (!) 97.4 F (36.3 C)   Filed Weights   02/02/23 1415  Weight: 201 lb 11.2 oz (91.5 kg)    Physical Exam Constitutional:      General: She is not in acute distress. HENT:     Head: Normocephalic and atraumatic.  Eyes:     General: No scleral icterus.    Pupils: Pupils are equal, round, and reactive to light.  Cardiovascular:     Rate and Rhythm: Normal rate and regular rhythm.     Heart sounds: Normal heart sounds.  Pulmonary:     Effort: Pulmonary effort is normal. No respiratory distress.     Breath sounds: No wheezing.  Abdominal:     General: Bowel sounds are normal. There is no distension.     Palpations: Abdomen is soft. There is no mass.     Tenderness: There is no abdominal tenderness.  Musculoskeletal:        General: No deformity. Normal range of motion.      Cervical back: Normal range of motion and neck supple.  Skin:    General: Skin is warm and dry.     Findings: No erythema or rash.  Neurological:     Mental Status: She is alert and oriented to person, place, and time. Mental status is at baseline.     Cranial Nerves: No cranial nerve deficit.     Coordination: Coordination normal.  Psychiatric:        Mood and Affect: Mood normal.      LABORATORY DATA:  I have reviewed the data as listed Lab Results  Component Value Date   WBC 9.3 01/31/2023   HGB 13.6 01/31/2023   HCT 38.6 01/31/2023   MCV 93.2 01/31/2023   PLT 304 01/31/2023   Recent Labs    08/01/22 1235 09/07/22 1041 12/21/22 1050  NA 137 137 140  K 3.2* 3.6 3.4*  CL 100 99 101  CO2 26 29 31   GLUCOSE 111* 121* 101*  BUN 19 11 14   CREATININE 0.86 0.88 0.85  CALCIUM 9.1 9.2 9.7  GFRNONAA >60  --   --   PROT 6.5  --  6.8  ALBUMIN 3.7  --  4.0  AST 22  --  16  ALT 17  --  14  ALKPHOS 73  --  68  BILITOT 0.8  --  0.7   Lab Results  Component Value Date   IRON 136 01/31/2023   TIBC 277 01/31/2023   IRONPCTSAT 49 (H) 01/31/2023   FERRITIN 57 01/31/2023        RADIOGRAPHIC STUDIES: I have personally reviewed the radiological images as listed and agreed with the findings in the report. No results found.

## 2023-02-02 NOTE — Progress Notes (Signed)
Rhonda Brock presents today for phlebotomy per MD orders. Phlebotomy procedure started at 1508 and ended at 1518 . 300 grams removed. Patient observed for 10 minutes after procedure without any incident, per pt request.  Decline 30 min observation. Patient tolerated procedure well. IV needle removed intact.

## 2023-02-02 NOTE — Assessment & Plan Note (Addendum)
Homozygous hemochromatosis mutation/iron overload Labs reviewed and discussed with patient.  LFTs stable. Ferritin is > 50,  iron saturation elevation.  Recommend phlebotomy today. Avoid iron supplementation, alcohol vitamin C supplementation.

## 2023-02-02 NOTE — Patient Instructions (Signed)

## 2023-02-02 NOTE — Progress Notes (Signed)
Pt here for follow up. No new concerns voiced.   

## 2023-02-03 ENCOUNTER — Other Ambulatory Visit: Payer: Medicare Other

## 2023-02-03 ENCOUNTER — Ambulatory Visit: Payer: Medicare Other | Admitting: Internal Medicine

## 2023-02-06 ENCOUNTER — Encounter: Payer: Self-pay | Admitting: Internal Medicine

## 2023-02-06 ENCOUNTER — Ambulatory Visit: Payer: Medicare Other | Admitting: Oncology

## 2023-02-08 ENCOUNTER — Encounter: Payer: Self-pay | Admitting: Internal Medicine

## 2023-02-08 ENCOUNTER — Other Ambulatory Visit: Payer: Self-pay | Admitting: Internal Medicine

## 2023-02-08 DIAGNOSIS — E876 Hypokalemia: Secondary | ICD-10-CM

## 2023-02-08 MED ORDER — SPIRONOLACTONE 25 MG PO TABS: 25.0000 mg | ORAL_TABLET | Freq: Every day | ORAL | 0 refills | Status: DC

## 2023-02-27 ENCOUNTER — Other Ambulatory Visit (INDEPENDENT_AMBULATORY_CARE_PROVIDER_SITE_OTHER): Payer: Medicare Other

## 2023-02-27 DIAGNOSIS — E876 Hypokalemia: Secondary | ICD-10-CM | POA: Diagnosis not present

## 2023-02-28 LAB — COMPREHENSIVE METABOLIC PANEL
ALT: 17 U/L (ref 0–35)
AST: 23 U/L (ref 0–37)
Albumin: 4 g/dL (ref 3.5–5.2)
Alkaline Phosphatase: 61 U/L (ref 39–117)
BUN: 17 mg/dL (ref 6–23)
CO2: 28 meq/L (ref 19–32)
Calcium: 9.2 mg/dL (ref 8.4–10.5)
Chloride: 104 meq/L (ref 96–112)
Creatinine, Ser: 0.86 mg/dL (ref 0.40–1.20)
GFR: 69.73 mL/min (ref >60.00)
Glucose, Bld: 96 mg/dL (ref 70–99)
Potassium: 4.1 meq/L (ref 3.5–5.1)
Sodium: 137 meq/L (ref 135–145)
Total Bilirubin: 0.6 mg/dL (ref 0.2–1.2)
Total Protein: 6.7 g/dL (ref 6.0–8.3)

## 2023-03-05 IMAGING — DX DG LUMBAR SPINE COMPLETE 4+V
5 series · 5 of 5 positions shown · non-contrast
Comparison: None.

CLINICAL DATA: Right buttocks and lateral leg pain.

EXAM:
LUMBAR SPINE - COMPLETE 4+ VIEW

[lumbar spine ap]
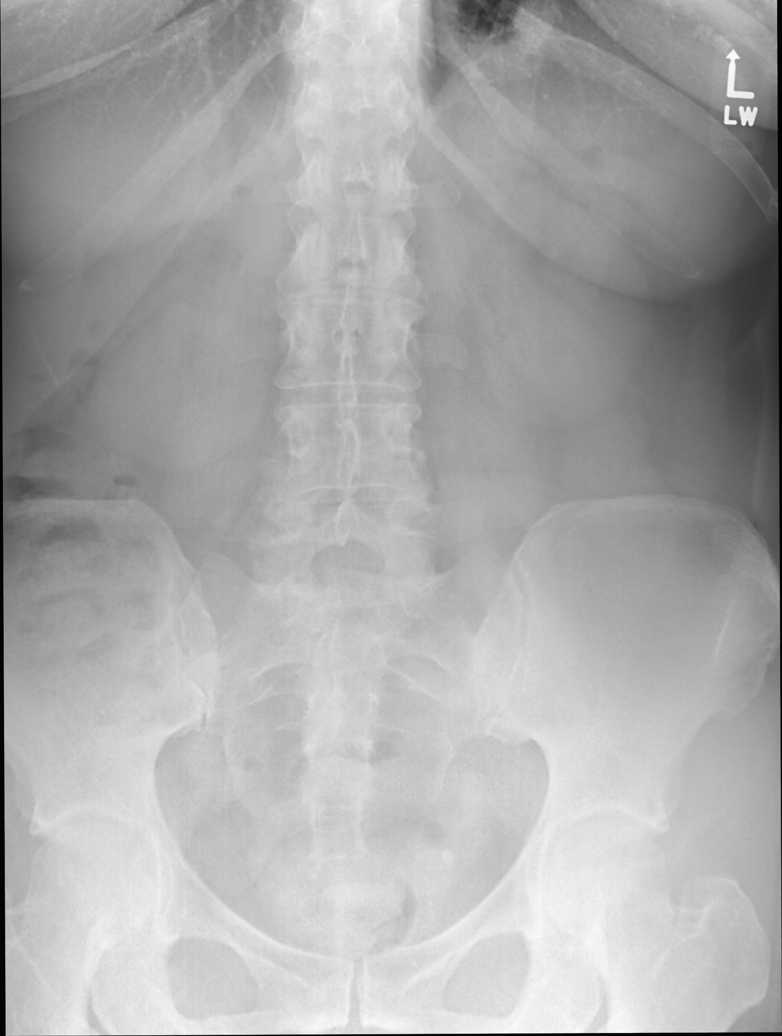

[lumbar spine obl (oblique) (1 of 2)]
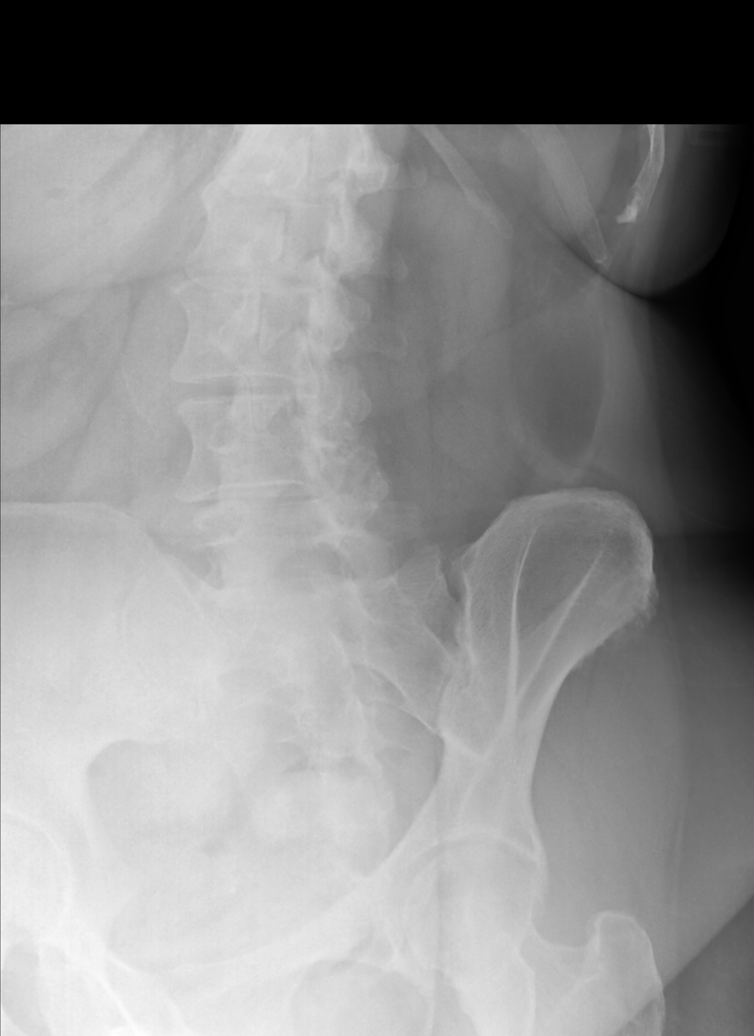

[lumbar spine obl (oblique) (2 of 2)]
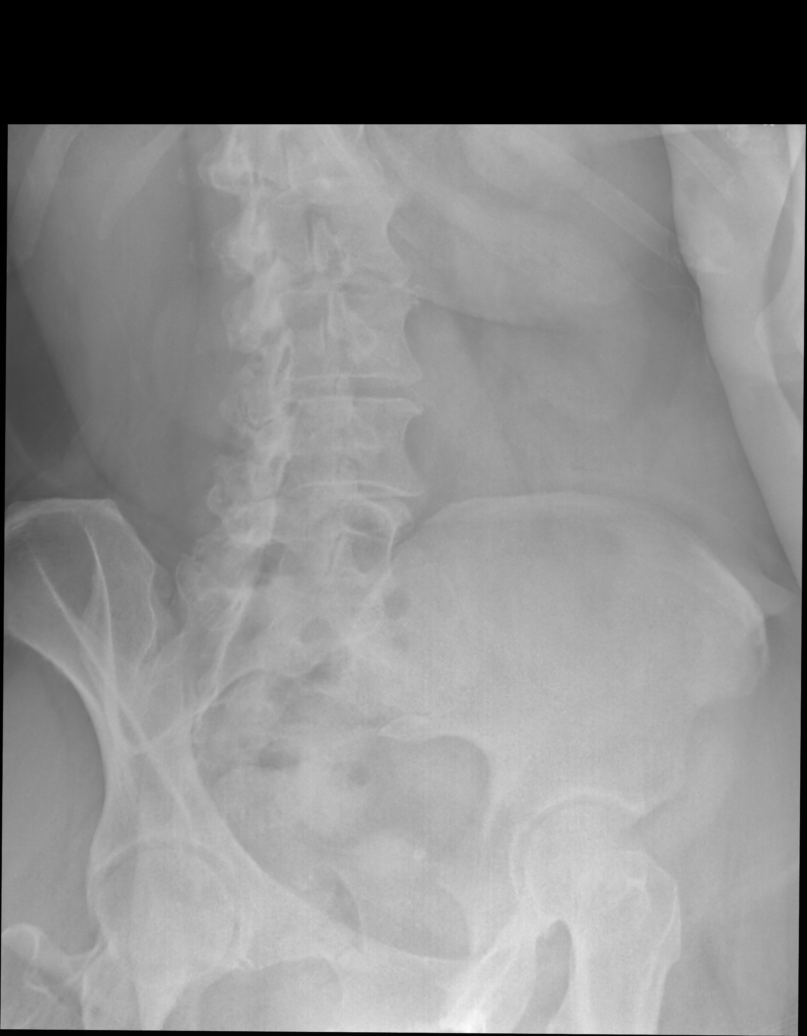

[lumbar spine lat]
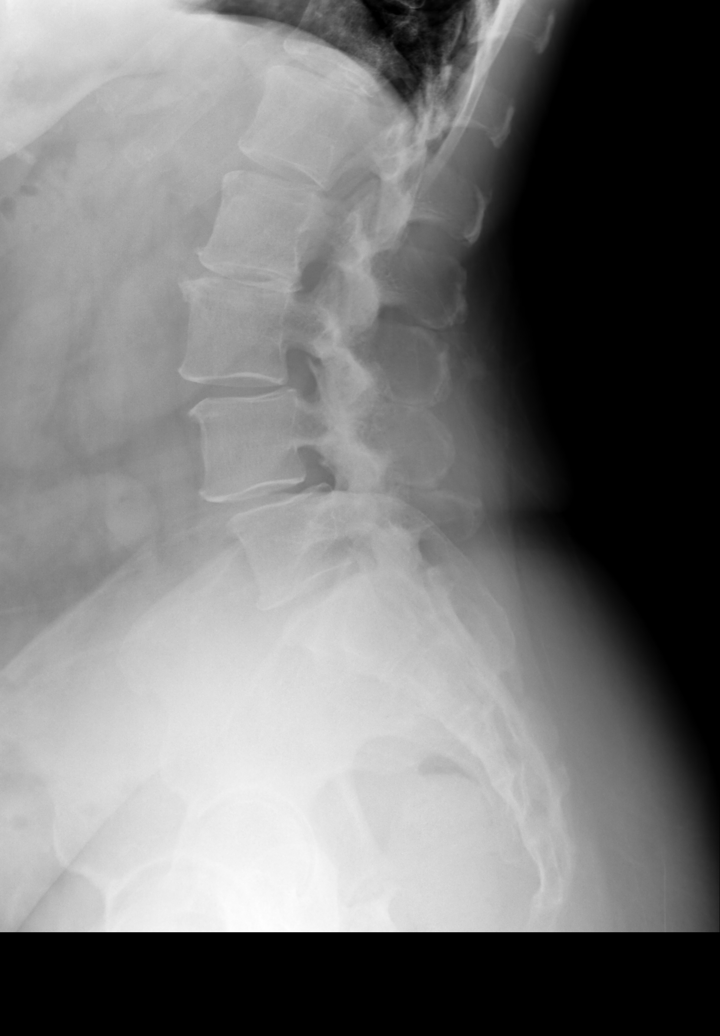

[lumbar spot lat]
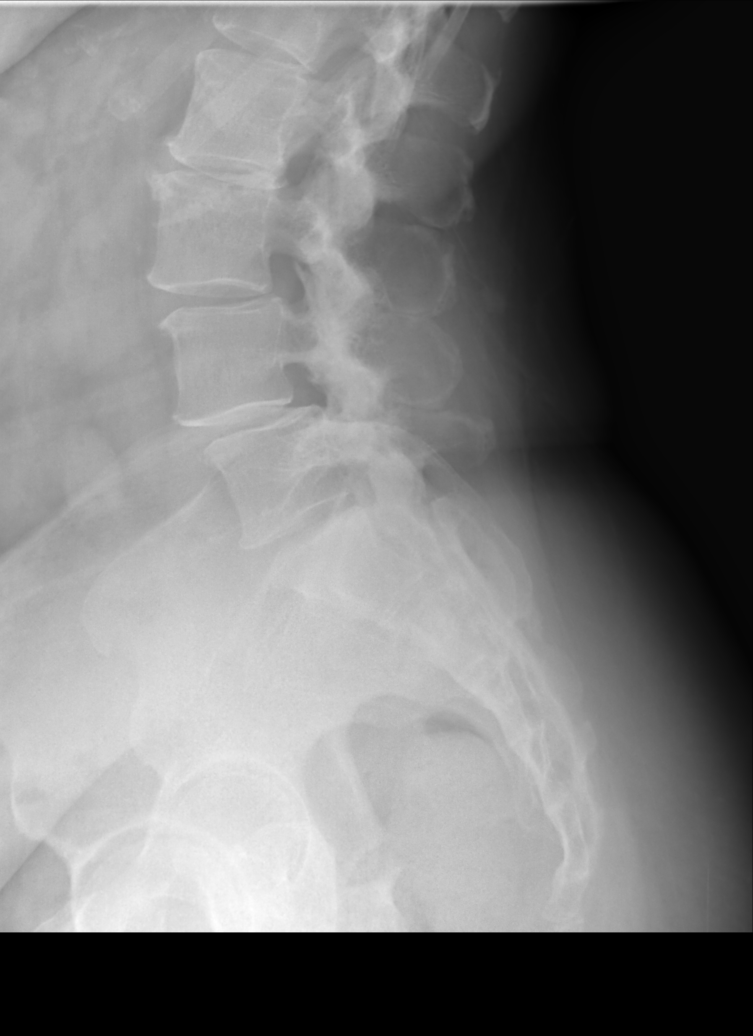

[5 of 5 positions shown; findings below may reference images not displayed]

FINDINGS: 9 mm anterolisthesis of L4 versus L5, grade 1. Trace anterolisthesis
of L3 versus L4 measuring 3.5 mm. No other malalignment. No acute
fractures. Multilevel degenerative disc disease with small anterior
osteophytes. Lower lumbar facet degenerative changes suspected. No
other abnormalities.
IMPRESSION: 1. Anterolisthesis of L4 versus L5 and L3 versus L4 as above.
Multilevel degenerative disc disease. Suspected mild lower lumbar
facet degenerative changes.

## 2023-03-15 ENCOUNTER — Other Ambulatory Visit: Payer: Self-pay | Admitting: Family

## 2023-03-15 DIAGNOSIS — M79604 Pain in right leg: Secondary | ICD-10-CM

## 2023-03-16 DIAGNOSIS — Z1231 Encounter for screening mammogram for malignant neoplasm of breast: Secondary | ICD-10-CM | POA: Diagnosis not present

## 2023-03-16 LAB — HM MAMMOGRAPHY

## 2023-03-17 ENCOUNTER — Encounter: Payer: Self-pay | Admitting: Internal Medicine

## 2023-03-18 ENCOUNTER — Other Ambulatory Visit: Payer: Self-pay | Admitting: Internal Medicine

## 2023-03-18 DIAGNOSIS — R928 Other abnormal and inconclusive findings on diagnostic imaging of breast: Secondary | ICD-10-CM

## 2023-03-20 ENCOUNTER — Telehealth: Payer: Self-pay

## 2023-03-20 NOTE — Telephone Encounter (Signed)
Spoke to pt. Notified pt orders was sent and she could call and schedule

## 2023-03-20 NOTE — Telephone Encounter (Signed)
Error

## 2023-03-20 NOTE — Telephone Encounter (Signed)
Copied from CRM [id number]. Topic: Referral - Status >> Mar 20, 2023  4:08 PM Corin V wrote: Reason for CRM: Patient is calling back regarding her mammogram referral to [facility]. [facility] is still not finding the order and wants to know how and where it was sent (fax, to what fax number, electronic, etc.). Please call patient back.

## 2023-03-20 NOTE — Telephone Encounter (Signed)
Reason for CRM: Patient called in wanting an update on additional mammogram order / please call [phone]

## 2023-03-21 ENCOUNTER — Other Ambulatory Visit: Payer: Self-pay | Admitting: Family Medicine

## 2023-03-21 ENCOUNTER — Telehealth: Payer: Self-pay

## 2023-03-21 DIAGNOSIS — R928 Other abnormal and inconclusive findings on diagnostic imaging of breast: Secondary | ICD-10-CM

## 2023-03-21 NOTE — Telephone Encounter (Signed)
Copied from CRM [id number]. Topic: Referral - Request for Referral >> Mar 21, 2023 11:16 AM Samuel Jester B wrote: Did the patient discuss referral with their provider in the last year? Yes (If No - schedule appointment) (If Yes - send message)  Appointment offered? Yes  Type of order/referral and detailed reason for visit: Diagnostic and left breat limited ultrasound   Preference of office, provider, location: [facility]   If referral order, have you been seen by this specialty before? No (If Yes, this issue or another issue? When? Where?  Can we respond through MyChart? N/A  Misty Stanley called from [facility] is request a left breast limited ultrasound code [id number]

## 2023-03-21 NOTE — Telephone Encounter (Signed)
Spoke with pt and she stated that she spoke with [facility] and has been scheduled for 04/27/2023.

## 2023-03-21 NOTE — Telephone Encounter (Signed)
Spoke with pt yesterday and advised her that I would fax the order over to them.  Called [facility] to get the fax number and order has been faxed.

## 2023-03-23 ENCOUNTER — Other Ambulatory Visit: Payer: Self-pay | Admitting: Internal Medicine

## 2023-03-30 ENCOUNTER — Encounter: Payer: Self-pay | Admitting: Family

## 2023-03-30 ENCOUNTER — Ambulatory Visit (INDEPENDENT_AMBULATORY_CARE_PROVIDER_SITE_OTHER): Payer: Medicare Other | Admitting: Family

## 2023-03-30 VITALS — BP 130/76 | HR 67 | Temp 98.4°F | Ht 65.0 in | Wt 197.6 lb

## 2023-03-30 DIAGNOSIS — J40 Bronchitis, not specified as acute or chronic: Secondary | ICD-10-CM

## 2023-03-30 MED ORDER — AZITHROMYCIN 250 MG PO TABS: ORAL_TABLET | ORAL | 0 refills | Status: AC

## 2023-03-30 NOTE — Progress Notes (Signed)
 Assessment & Plan:  Bronchitis Assessment & Plan: Afebrile and nontoxic in appearance.  Advised  more likely viral in etiology.  Advised Mucinex DM.  I did provide a prescription for azithromycin  and advised patient to start antibiotic  and probiotics if symptoms persist or most certainly if she experiences worsening of symptoms.  She will let me know how she is doing.  Orders: -     Azithromycin ; Take 2 tablets on day 1, then 1 tablet daily on days 2 through 5  Dispense: 6 tablet; Refill: 0     Return precautions given.   Risks, benefits, and alternatives of the medications and treatment plan prescribed today were discussed, and patient expressed understanding.   Education regarding symptom management and diagnosis given to patient on AVS either electronically or printed.  No follow-ups on file.  Rollene Northern, FNP  Subjective:    Patient ID: Rhonda Brock, female    DOB: September 01, 1955, 68 y.o.   MRN: [id number]  CC: Rhonda Brock is a 68 y.o. female who presents today for an acute visit.    HPI: Complains of nonproductive cough x 1 week, unchanged  Some relief with dextromorphan otc syrup  No sob, sinus pain, fever, myalgia, wheezing     No h/o ckd History of hypertension, hypokalemia Never smoker Allergies: Patient has no known allergies. Current Outpatient Medications on File Prior to Visit  Medication Sig Dispense Refill   calcium carbonate (TUMS - DOSED IN MG ELEMENTAL CALCIUM) 500 MG chewable tablet Chew 1 tablet by mouth daily.     finasteride (PROSCAR) 5 MG tablet Take 2.5 mg by mouth daily.     meloxicam  (MOBIC ) 7.5 MG tablet TAKE 1 TABLET BY MOUTH EVERY DAY AS NEEDED FOR PAIN 30 tablet 1   sertraline  (ZOLOFT ) 50 MG tablet TAKE 1 TABLET (50 MG TOTAL) BY MOUTH DAILY. ` 90 tablet 1   spironolactone  (ALDACTONE ) 25 MG tablet Take 1 tablet (25 mg total) by mouth daily. 90 tablet 0   telmisartan  (MICARDIS ) 20 MG tablet Take 1 tablet (20 mg total) by mouth daily.  90 tablet 1   No current facility-administered medications on file prior to visit.    Review of Systems  Constitutional:  Negative for chills and fever.  HENT:  Positive for congestion. Negative for sinus pressure and sore throat.   Respiratory:  Positive for cough. Negative for shortness of breath and wheezing.   Cardiovascular:  Negative for chest pain and palpitations.  Gastrointestinal:  Negative for nausea and vomiting.      Objective:    BP 130/76   Pulse 67   Temp 98.4 F (36.9 C) (Oral)   Ht 5' 5 (1.651 m)   Wt 197 lb 9.6 oz (89.6 kg)   SpO2 97%   BMI 32.88 kg/m   BP Readings from Last 3 Encounters:  03/30/23 130/76  02/02/23 112/77  02/02/23 115/67   Wt Readings from Last 3 Encounters:  03/30/23 197 lb 9.6 oz (89.6 kg)  02/02/23 201 lb 11.2 oz (91.5 kg)  12/21/22 201 lb 12.8 oz (91.5 kg)    Physical Exam Vitals reviewed.  Constitutional:      Appearance: She is well-developed.  HENT:     Head: Normocephalic and atraumatic.     Right Ear: Hearing, tympanic membrane, ear canal and external ear normal. No decreased hearing noted. No drainage, swelling or tenderness. No middle ear effusion. No foreign body. Tympanic membrane is not erythematous or bulging.     Left Ear:  Hearing, tympanic membrane, ear canal and external ear normal. No decreased hearing noted. No drainage, swelling or tenderness.  No middle ear effusion. No foreign body. Tympanic membrane is not erythematous or bulging.     Nose: Nose normal. No rhinorrhea.     Right Sinus: No maxillary sinus tenderness or frontal sinus tenderness.     Left Sinus: No maxillary sinus tenderness or frontal sinus tenderness.     Mouth/Throat:     Pharynx: Uvula midline. No oropharyngeal exudate or posterior oropharyngeal erythema.     Tonsils: No tonsillar abscesses.  Eyes:     Conjunctiva/sclera: Conjunctivae normal.  Cardiovascular:     Rate and Rhythm: Regular rhythm.     Pulses: Normal pulses.     Heart  sounds: Normal heart sounds.  Pulmonary:     Effort: Pulmonary effort is normal.     Breath sounds: Normal breath sounds. No wheezing, rhonchi or rales.  Lymphadenopathy:     Head:     Right side of head: No submental, submandibular, tonsillar, preauricular, posterior auricular or occipital adenopathy.     Left side of head: No submental, submandibular, tonsillar, preauricular, posterior auricular or occipital adenopathy.     Cervical: No cervical adenopathy.  Skin:    General: Skin is warm and dry.  Neurological:     Mental Status: She is alert.  Psychiatric:        Speech: Speech normal.        Behavior: Behavior normal.        Thought Content: Thought content normal.

## 2023-03-30 NOTE — Assessment & Plan Note (Signed)
 Afebrile and nontoxic in appearance.  Advised  more likely viral in etiology.  Advised Mucinex DM.  I did provide a prescription for azithromycin  and advised patient to start antibiotic  and probiotics if symptoms persist or most certainly if she experiences worsening of symptoms.  She will let me know how she is doing.

## 2023-03-30 NOTE — Patient Instructions (Addendum)
 I suspect cough is more viral etiology   start mucinex DM. This is over the counter and is an expectorant to help break thick mucous.  Please  stay well hydrated.   If symptoms worsen or persist, start the antibiotic, Zpak,  and complete along with probiotics.   Nice to see you!

## 2023-04-02 ENCOUNTER — Encounter: Payer: Self-pay | Admitting: Family

## 2023-04-27 ENCOUNTER — Encounter: Payer: Self-pay | Admitting: Internal Medicine

## 2023-04-27 ENCOUNTER — Encounter: Payer: Self-pay | Admitting: Family Medicine

## 2023-04-27 DIAGNOSIS — R928 Other abnormal and inconclusive findings on diagnostic imaging of breast: Secondary | ICD-10-CM | POA: Diagnosis not present

## 2023-04-27 DIAGNOSIS — N6324 Unspecified lump in the left breast, lower inner quadrant: Secondary | ICD-10-CM | POA: Diagnosis not present

## 2023-04-29 ENCOUNTER — Other Ambulatory Visit: Payer: Self-pay | Admitting: Internal Medicine

## 2023-04-29 DIAGNOSIS — N6320 Unspecified lump in the left breast, unspecified quadrant: Secondary | ICD-10-CM | POA: Insufficient documentation

## 2023-05-01 ENCOUNTER — Telehealth: Payer: Self-pay | Admitting: Internal Medicine

## 2023-05-01 NOTE — Telephone Encounter (Signed)
Patient dropped off note for provider. She would like an order from provider after having a mammo and ultrasound. Letter was placed in colorful folder.

## 2023-05-01 NOTE — Telephone Encounter (Signed)
Order was place don 04/29/23. Pt is aware. Pt is aslo aware that our scheduler is working on getting it scheduled for her.

## 2023-05-02 ENCOUNTER — Ambulatory Visit: Payer: Self-pay | Admitting: Internal Medicine

## 2023-05-02 NOTE — Telephone Encounter (Signed)
Has the breast biopsy order been faxed to [facility] yet?

## 2023-05-02 NOTE — Telephone Encounter (Signed)
Patient called to advise that order must be faxed to [facility]. Patient asks that we follow up with a MyChart message once fax has been sent. Please assist.   Phone: [phone]

## 2023-05-02 NOTE — Telephone Encounter (Signed)
 See previous message

## 2023-05-03 NOTE — Telephone Encounter (Signed)
 ORDER HAS BEEN PRINTED SIGNED AND FAXED.

## 2023-05-08 NOTE — Telephone Encounter (Signed)
 noted

## 2023-05-09 ENCOUNTER — Other Ambulatory Visit: Payer: Self-pay | Admitting: Internal Medicine

## 2023-05-13 ENCOUNTER — Other Ambulatory Visit: Payer: Self-pay | Admitting: Internal Medicine

## 2023-05-15 ENCOUNTER — Encounter: Payer: Self-pay | Admitting: Internal Medicine

## 2023-05-15 ENCOUNTER — Telehealth (INDEPENDENT_AMBULATORY_CARE_PROVIDER_SITE_OTHER): Payer: Medicare Other | Admitting: Internal Medicine

## 2023-05-15 VITALS — Ht 65.0 in | Wt 197.6 lb

## 2023-05-15 DIAGNOSIS — F411 Generalized anxiety disorder: Secondary | ICD-10-CM

## 2023-05-15 DIAGNOSIS — R5383 Other fatigue: Secondary | ICD-10-CM

## 2023-05-15 DIAGNOSIS — E876 Hypokalemia: Secondary | ICD-10-CM

## 2023-05-15 DIAGNOSIS — I10 Essential (primary) hypertension: Secondary | ICD-10-CM | POA: Diagnosis not present

## 2023-05-15 DIAGNOSIS — E669 Obesity, unspecified: Secondary | ICD-10-CM

## 2023-05-15 NOTE — Progress Notes (Unsigned)
Virtual Visit via Caregility   Note   This format is felt to be most appropriate for this patient at this time.  All issues noted in this document were discussed and addressed.  No physical exam was performed (except for noted visual exam findings with Video Visits).   I connected with Byrd Hesselbach  on 05/15/23 at  5:00 PM EST by a video enabled telemedicine application  and verified that I am speaking with the correct person using two identifiers. Location patient: home Location provider: work or home office Persons participating in the virtual visit: patient, provider  I discussed the limitations, risks, security and privacy concerns of performing an evaluation and management service by telephone and the availability of in person appointments. I also discussed with the patient that there may be a patient responsible charge related to this service. The patient expressed understanding and agreed to proceed.  Reason for visit: follow up   HPI:  68 yr old female with hypertension . GAD aggravated by elderly husband's steady decline due to dementia .managed with zoloft  GAD;  symptoms are worse in the morning for about an hour  Breast biopsy left breast in early March 3  at [facility]       ROS: See pertinent positives and negatives per HPI.  Past Medical History:  Diagnosis Date   Anxiety    Back pain    Chronic headaches    Cyst (solitary) of breast    bilaterally   GERD (gastroesophageal reflux disease)    Hemochromatosis, hereditary (HCC) 03/22/2018   Hypertension     Past Surgical History:  Procedure Laterality Date   ABDOMINAL HYSTERECTOMY  2001   APPENDECTOMY  1962   breast cysts     BREAST SURGERY  2004, 2005   left breast cyst drained x 2   CHOLECYSTECTOMY  06/2019   COLONOSCOPY  06/08/2006   COLONOSCOPY WITH PROPOFOL N/A 06/29/2016   Procedure: COLONOSCOPY WITH PROPOFOL;  Surgeon: Earline Mayotte, MD;  Location: [facility] ENDOSCOPY;  Service: Endoscopy;  Laterality: N/A;    TONSILLECTOMY  1970    Family History  Problem Relation Age of Onset   Cancer Mother 83       Breast Cancer-Mastectomy, Also Abdominal cancer   Asthma Father    Diabetes Father     SOCIAL HX: ***   Current Outpatient Medications:    calcium carbonate (TUMS - DOSED IN MG ELEMENTAL CALCIUM) 500 MG chewable tablet, Chew 1 tablet by mouth daily., Disp: , Rfl:    finasteride (PROSCAR) 5 MG tablet, Take 2.5 mg by mouth daily., Disp: , Rfl:    meloxicam (MOBIC) 7.5 MG tablet, TAKE 1 TABLET BY MOUTH EVERY DAY AS NEEDED FOR PAIN, Disp: 30 tablet, Rfl: 1   sertraline (ZOLOFT) 50 MG tablet, TAKE 1 TABLET (50 MG TOTAL) BY MOUTH DAILY. `, Disp: 90 tablet, Rfl: 1   spironolactone (ALDACTONE) 25 MG tablet, Take 1 tablet (25 mg total) by mouth daily., Disp: 90 tablet, Rfl: 0   telmisartan (MICARDIS) 20 MG tablet, Take 1 tablet (20 mg total) by mouth daily., Disp: 90 tablet, Rfl: 1  EXAM:  VITALS per patient if applicable:  GENERAL: alert, oriented, appears well and in no acute distress  HEENT: atraumatic, conjunttiva clear, no obvious abnormalities on inspection of external nose and ears  NECK: normal movements of the head and neck  LUNGS: on inspection no signs of respiratory distress, breathing rate appears normal, no obvious gross SOB, gasping or wheezing  CV: no  obvious cyanosis  MS: moves all visible extremities without noticeable abnormality  PSYCH/NEURO: pleasant and cooperative, no obvious depression or anxiety, speech and thought processing grossly intact  ASSESSMENT AND PLAN: There are no diagnoses linked to this encounter.    I discussed the assessment and treatment plan with the patient. The patient was provided an opportunity to ask questions and all were answered. The patient agreed with the plan and demonstrated an understanding of the instructions.   The patient was advised to call back or seek an in-person evaluation if the symptoms worsen or if the condition fails to  improve as anticipated.   I spent 30 minutes dedicated to the care of this patient on the date of this encounter to include pre-visit review of his medical history,  Face-to-face time with the patient , and post visit ordering of testing and therapeutics.    Sherlene Shams, MD

## 2023-05-16 MED ORDER — SPIRONOLACTONE 25 MG PO TABS
25.0000 mg | ORAL_TABLET | Freq: Every day | ORAL | 1 refills | Status: DC
Start: 2023-05-16 — End: 2023-09-26

## 2023-05-16 MED ORDER — TELMISARTAN 20 MG PO TABS: 20.0000 mg | ORAL_TABLET | Freq: Every day | ORAL | 1 refills | Status: DC

## 2023-05-16 NOTE — Assessment & Plan Note (Signed)
I have addressed  BMI and encouraged wt loss of 10% of body weight over the next 6 months using a low glycemic index diet and regular exercise a minimum of 5 days per week.

## 2023-05-16 NOTE — Assessment & Plan Note (Signed)
Well controlled on current regimen of hctz 25 mg daily.     Reminded to suspend medication during any GI illness that would lead to dehydration  And to report any recurrent muscle cramping  Lab Results  Component Value Date   CREATININE 0.86 02/27/2023   Lab Results  Component Value Date   NA 137 02/27/2023   K 4.1 02/27/2023   CL 104 02/27/2023   CO2 28 02/27/2023

## 2023-05-16 NOTE — Assessment & Plan Note (Addendum)
Managed by hematology with periodic phlebotomies  to maintain normal iron saturation .  Reminded to Avoid iron supplementation, alcohol vitamin C supplementation.  Lab Results  Component Value Date   WBC 9.3 01/31/2023   HGB 13.6 01/31/2023   HCT 38.6 01/31/2023   MCV 93.2 01/31/2023   PLT 304 01/31/2023   Lab Results  Component Value Date   IRON 136 01/31/2023   TIBC 277 01/31/2023   FERRITIN 57 01/31/2023

## 2023-05-16 NOTE — Assessment & Plan Note (Signed)
Resolved, with increased support from caregivers and physical therapist at [facility] who are working with her husband.

## 2023-05-16 NOTE — Assessment & Plan Note (Signed)
Resolved with addition of spironolactone to ARB  Lab Results  Component Value Date   NA 137 02/27/2023   K 4.1 02/27/2023   CL 104 02/27/2023   CO2 28 02/27/2023

## 2023-05-16 NOTE — Assessment & Plan Note (Signed)
She previously reported waking up with palpitations and having decreased concentration due to worrying bout her husband [name] who has transitioned to [employer]  .he is now in the later stages of dementia and she has prepared herself for his passing.  Her symptoms are controlled for the most part with use of sertraline  50 mg .   no changes today

## 2023-05-24 ENCOUNTER — Other Ambulatory Visit: Payer: Self-pay | Admitting: Internal Medicine

## 2023-05-24 ENCOUNTER — Encounter: Payer: Self-pay | Admitting: Internal Medicine

## 2023-05-24 MED ORDER — ALPRAZOLAM 0.25 MG PO TABS: 0.2500 mg | ORAL_TABLET | Freq: Two times a day (BID) | ORAL | 0 refills | Status: DC | PRN

## 2023-05-29 DIAGNOSIS — N6012 Diffuse cystic mastopathy of left breast: Secondary | ICD-10-CM | POA: Diagnosis not present

## 2023-05-29 DIAGNOSIS — R928 Other abnormal and inconclusive findings on diagnostic imaging of breast: Secondary | ICD-10-CM | POA: Diagnosis not present

## 2023-05-29 DIAGNOSIS — N6002 Solitary cyst of left breast: Secondary | ICD-10-CM | POA: Diagnosis not present

## 2023-05-29 DIAGNOSIS — R92322 Mammographic fibroglandular density, left breast: Secondary | ICD-10-CM | POA: Diagnosis not present

## 2023-05-29 DIAGNOSIS — N6324 Unspecified lump in the left breast, lower inner quadrant: Secondary | ICD-10-CM | POA: Diagnosis not present

## 2023-05-30 ENCOUNTER — Encounter: Payer: Self-pay | Admitting: Internal Medicine

## 2023-05-31 ENCOUNTER — Telehealth: Payer: Self-pay

## 2023-05-31 NOTE — Telephone Encounter (Signed)
 Copied from CRM [id number]. Topic: Clinical - Lab/Test Results >> May 31, 2023  4:34 PM Melissa C wrote: Reason for CRM: Ebony at [facility] has Breast Biopsy Results to pass along to a member of the patient's clinical team. Callback number is [phone] and she is available from 9am-5pm Monday-Friday.

## 2023-05-31 NOTE — Telephone Encounter (Signed)
 Ebony called back but the CAL was closed. Please call back & advise when office opens.

## 2023-05-31 NOTE — Telephone Encounter (Signed)
 LMTCB with Ebony.

## 2023-06-01 ENCOUNTER — Telehealth: Payer: Self-pay

## 2023-06-01 NOTE — Telephone Encounter (Signed)
 See other message

## 2023-06-01 NOTE — Telephone Encounter (Signed)
 Copied from CRM [id number]. Topic: Clinical - Lab/Test Results >> Jun 01, 2023 11:04 AM Theodis Sato wrote: Reason for CRM: Ebony at [facility] needs the following to be relayed- Patients left breast biopsy came back as benign, and states patient can return back to annual mammogram screening. Rhonda Brock states that patient has already been made aware of the results and radiologist recommendation.   For any further clarification needed please call Ebony at [phone]

## 2023-07-04 ENCOUNTER — Ambulatory Visit (INDEPENDENT_AMBULATORY_CARE_PROVIDER_SITE_OTHER): Payer: Medicare Other

## 2023-07-04 VITALS — BP 130/76 | HR 67 | Ht 65.0 in | Wt 197.0 lb

## 2023-07-04 DIAGNOSIS — Z0000 Encounter for general adult medical examination without abnormal findings: Secondary | ICD-10-CM | POA: Diagnosis not present

## 2023-07-04 NOTE — Patient Instructions (Signed)
 Rhonda Brock , Thank you for taking time to come for your Medicare Wellness Visit. I appreciate your ongoing commitment to your health goals. Please review the following plan we discussed and let me know if I can assist you in the future.   Referrals/Orders/Follow-Ups/Clinician Recommendations: Here is a reminder for you to get your Tetanus and Pneumonia vaccine at your next visit.   This is a list of the screening recommended for you and due dates:  Health Maintenance  Topic Date Due   Zoster (Shingles) Vaccine (1 of 2) Never done   Pneumonia Vaccine (1 of 1 - PCV) Never done   DTaP/Tdap/Td vaccine (2 - Td or Tdap) 03/28/2021   COVID-19 Vaccine (5 - 2024-25 season) 07/19/2024*   Flu Shot  10/27/2023   Mammogram  03/15/2024   Medicare Annual Wellness Visit  07/03/2024   Colon Cancer Screening  06/30/2026   DEXA scan (bone density measurement)  Completed   Hepatitis C Screening  Completed   HPV Vaccine  Aged Out  *Topic was postponed. The date shown is not the original due date.    Advanced directives: (Copy Requested) Please bring a copy of your health care power of attorney and living will to the office to be added to your chart at your convenience. You can mail to [facility] [address] [place], [place] [address] or email to [email or web address]  Next Medicare Annual Wellness Visit scheduled for next year: Yes

## 2023-07-04 NOTE — Progress Notes (Signed)
 Subjective:   Rhonda Brock is a 68 y.o. who presents for a Medicare Wellness preventive visit.  Visit Complete: Virtual I connected with  Rhonda Brock on 07/04/23 by a video and audio enabled telemedicine application and verified that I am speaking with the correct person using two identifiers.  Patient Location: Home  Provider Location: Home Office  I discussed the limitations of evaluation and management by telemedicine. The patient expressed understanding and agreed to proceed.  Vital Signs: Because this visit was a virtual/telehealth visit, some criteria may be missing or patient reported. Any vitals not documented were not able to be obtained and vitals that have been documented are patient reported.  VideoDeclined- This patient declined Librarian, academic. Therefore the visit was completed with audio only.  Persons Participating in Visit: Patient.  AWV Questionnaire: Yes: Patient Medicare AWV questionnaire was completed by the patient on 06/27/23; I have confirmed that all information answered by patient is correct and no changes since this date.  Cardiac Risk Factors include: advanced age (>49men, >12 women);dyslipidemia;hypertension;obesity (BMI >30kg/m2)     Objective:    Today's Vitals   07/04/23 0811  BP: 130/76  Pulse: 67  Weight: 197 lb (89.4 kg)  Height: 5\' 5"  (1.651 m)   Body mass index is 32.78 kg/m.     07/04/2023    8:22 AM 02/02/2023    3:00 PM 07/01/2022    8:23 AM 01/31/2022    1:13 PM 07/28/2021    1:08 PM 06/23/2021   10:28 AM 01/26/2021    1:04 PM  Advanced Directives  Does Patient Have a Medical Advance Directive? Yes No Yes Yes Yes Yes Yes  Type of Estate agent of [place];Living will  Living will;Healthcare Power of Attorney Living will;Healthcare Power of [employer] Power of [place];Living will Healthcare Power of [place];Living will  Does patient want to make changes to medical  advance directive?   No - Patient declined   No - Patient declined No - Patient declined  Copy of Healthcare Power of Attorney in Chart? No - copy requested  Yes - validated most recent copy scanned in chart (See row information)   Yes - validated most recent copy scanned in chart (See row information)   Would patient like information on creating a medical advance directive?  No - Patient declined         Current Medications (verified) Outpatient Encounter Medications as of 07/04/2023  Medication Sig   ALPRAZolam (XANAX) 0.25 MG tablet Take 1 tablet (0.25 mg total) by mouth 2 (two) times daily as needed for anxiety.   calcium carbonate (TUMS - DOSED IN MG ELEMENTAL CALCIUM) 500 MG chewable tablet Chew 1 tablet by mouth daily.   finasteride (PROSCAR) 5 MG tablet Take 2.5 mg by mouth daily.   meloxicam (MOBIC) 7.5 MG tablet TAKE 1 TABLET BY MOUTH EVERY DAY AS NEEDED FOR PAIN   sertraline (ZOLOFT) 50 MG tablet TAKE 1 TABLET (50 MG TOTAL) BY MOUTH DAILY. `   spironolactone (ALDACTONE) 25 MG tablet Take 1 tablet (25 mg total) by mouth daily.   telmisartan (MICARDIS) 20 MG tablet Take 1 tablet (20 mg total) by mouth daily.   No facility-administered encounter medications on file as of 07/04/2023.    Allergies (verified) Patient has no known allergies.   History: Past Medical History:  Diagnosis Date   Anxiety    Back pain    Chronic headaches    Cyst (solitary) of breast  bilaterally   GERD (gastroesophageal reflux disease)    Hemochromatosis, hereditary (HCC) 03/22/2018   Hypertension    Past Surgical History:  Procedure Laterality Date   ABDOMINAL HYSTERECTOMY  2001   APPENDECTOMY  1962   breast cysts     BREAST SURGERY  2004, 2005   left breast cyst drained x 2   CHOLECYSTECTOMY  06/2019   COLONOSCOPY  06/08/2006   COLONOSCOPY WITH PROPOFOL N/A 06/29/2016   Procedure: COLONOSCOPY WITH PROPOFOL;  Surgeon: Earline Mayotte, MD;  Location: [facility] ENDOSCOPY;  Service: Endoscopy;   Laterality: N/A;   TONSILLECTOMY  1970   Family History  Problem Relation Age of Onset   Cancer Mother 49       Breast Cancer-Mastectomy, Also Abdominal cancer   Asthma Father    Diabetes Father    Social History   Socioeconomic History   Marital status: Married    Spouse name: Not on file   Number of children: Not on file   Years of education: 14   Highest education level: Associate degree: occupational, Scientist, product/process development, or vocational program  Occupational History   Occupation: Homemaker  Tobacco Use   Smoking status: Never   Smokeless tobacco: Never  Vaping Use   Vaping status: Never Used  Substance and Sexual Activity   Alcohol use: No   Drug use: No   Sexual activity: Not on file  Other Topics Concern   Not on file  Social History Narrative   Regular exercise-yes   Caffeine use-yes         Social Drivers of Health   Financial Resource Strain: Low Risk  (07/04/2023)   Overall Financial Resource Strain (CARDIA)    Difficulty of Paying Living Expenses: Not hard at all  Food Insecurity: No Food Insecurity (07/04/2023)   Hunger Vital Sign    Worried About Running Out of Food in the Last Year: Never true    Ran Out of Food in the Last Year: Never true  Transportation Needs: No Transportation Needs (07/04/2023)   PRAPARE - Administrator, Civil Service (Medical): No    Lack of Transportation (Non-Medical): No  Physical Activity: Insufficiently Active (07/04/2023)   Exercise Vital Sign    Days of Exercise per Week: 3 days    Minutes of Exercise per Session: 40 min  Stress: No Stress Concern Present (07/04/2023)   [employer] of Occupational Health - Occupational Stress Questionnaire    Feeling of Stress : Not at all  Social Connections: Moderately Integrated (07/04/2023)   Social Connection and Isolation Panel [NHANES]    Frequency of Communication with Friends and Family: More than three times a week    Frequency of Social Gatherings with Friends and Family:  More than three times a week    Attends Religious Services: More than 4 times per year    Active Member of [employer] or Organizations: Yes    Attends Banker Meetings: More than 4 times per year    Marital Status: Widowed    Tobacco Counseling Counseling given: No    Clinical Intake:           BMI - recorded: 32.78 Nutritional Status: BMI > 30  Obese Nutritional Risks: None Diabetes: No  Lab Results  Component Value Date   HGBA1C 5.8 12/21/2022   HGBA1C 5.6 06/11/2020   HGBA1C 5.6 02/19/2018     How often do you need to have someone help you when you read instructions, pamphlets, or other written materials  from your doctor or pharmacy?: 1 - Never  Interpreter Needed?: No  Information entered by :: Alia T/cma   Activities of Daily Living     06/27/2023   10:09 AM  In your present state of health, do you have any difficulty performing the following activities:  Hearing? 0  Vision? 0  Difficulty concentrating or making decisions? 0  Walking or climbing stairs? 0  Dressing or bathing? 0  Doing errands, shopping? 0  Preparing Food and eating ? N  Using the Toilet? N  Do you have problems with loss of bowel control? N  Managing your Medications? N  Managing your Finances? N  Housekeeping or managing your Housekeeping? N    Patient Care Team: Sherlene Shams, MD as PCP - General (Internal Medicine) Sherlene Shams, MD (Internal Medicine) Lemar Livings Merrily Pew, MD (General Surgery) Rickard Patience, MD as Consulting Physician (Hematology and Oncology)  Indicate any recent Medical Services you may have received from other than [facility] providers in the past year (date may be approximate).     Assessment:   This is a routine wellness examination for [facility]  Hearing/Vision screen Hearing Screening - Comments:: Pt denies hearing def Vision Screening - Comments:: Pt denies vision def   Goals Addressed             This Visit's Progress    Maintain Healthy  Lifestyle   On track    Stay active Healthy diet Stay hydrated       Depression Screen     07/04/2023    8:19 AM 05/15/2023    4:56 PM 03/30/2023   10:46 AM 12/21/2022   10:12 AM 07/01/2022    8:20 AM 10/25/2021    3:52 PM 09/20/2021    4:39 PM  PHQ 2/9 Scores  PHQ - 2 Score 1 0 0 1 0 0 2  PHQ- 9 Score 1      5    Fall Risk     06/27/2023   10:09 AM 05/15/2023    4:56 PM 03/30/2023   10:45 AM 12/21/2022   10:04 AM 07/01/2022    8:23 AM  Fall Risk   Falls in the past year? 0 0 0 0 0  Number falls in past yr: 0 0 0 0 0  Injury with Fall? 0 0 0 0 0  Risk for fall due to :  No Fall Risks No Fall Risks No Fall Risks   Follow up  Falls evaluation completed Falls evaluation completed Falls evaluation completed Falls evaluation completed;Falls prevention discussed    MEDICARE RISK AT HOME:  Medicare Risk at Home Any stairs in or around the home?: (Patient-Rptd) No If so, are there any without handrails?: (Patient-Rptd) No Home free of loose throw rugs in walkways, pet beds, electrical cords, etc?: (Patient-Rptd) Yes Adequate lighting in your home to reduce risk of falls?: (Patient-Rptd) Yes Life alert?: (Patient-Rptd) No Use of a cane, walker or w/c?: (Patient-Rptd) No Grab bars in the bathroom?: (Patient-Rptd) Yes Shower chair or bench in shower?: (Patient-Rptd) No Elevated toilet seat or a handicapped toilet?: (Patient-Rptd) No  TIMED UP AND GO:  Was the test performed?  No  Cognitive Function: 6CIT completed        07/04/2023    8:26 AM 07/01/2022    8:24 AM  6CIT Screen  What Year? 0 points 0 points  What month? 0 points 0 points  What time? 0 points 0 points  Count back from 20 0 points 0  points  Months in reverse 0 points 0 points  Repeat phrase 0 points 0 points  Total Score 0 points 0 points    Immunizations Immunization History  Administered Date(s) Administered   Hepatitis B, ADULT 05/17/2018, 06/19/2018, 11/16/2018   Influenza,inj,Quad PF,6+ Mos 01/11/2018,  12/31/2018, 01/03/2020   Influenza-Unspecified 12/29/2013, 01/15/2015, 01/19/2023   MMR 09/25/2000   Moderna Covid-19 Fall Seasonal Vaccine 34yrs & older 12/23/2022   Moderna Sars-Covid-2 Vaccination 04/12/2019, 05/10/2019, 02/11/2020   Tdap 03/29/2011    Screening Tests Health Maintenance  Topic Date Due   Zoster Vaccines- Shingrix (1 of 2) Never done   Pneumonia Vaccine 48+ Years old (1 of 1 - PCV) Never done   DTaP/Tdap/Td (2 - Td or Tdap) 03/28/2021   COVID-19 Vaccine (5 - 2024-25 season) 07/19/2024 (Originally 06/22/2023)   INFLUENZA VACCINE  10/27/2023   MAMMOGRAM  03/15/2024   Medicare Annual Wellness (AWV)  07/03/2024   Colonoscopy  06/30/2026   DEXA SCAN  Completed   Hepatitis C Screening  Completed   HPV VACCINES  Aged Out    Health Maintenance  Health Maintenance Due  Topic Date Due   Zoster Vaccines- Shingrix (1 of 2) Never done   Pneumonia Vaccine 44+ Years old (1 of 1 - PCV) Never done   DTaP/Tdap/Td (2 - Td or Tdap) 03/28/2021   Health Maintenance Items Addressed: See Nurse Notes  Additional Screening:  Vision Screening: Recommended annual ophthalmology exams for early detection of glaucoma and other disorders of the eye.  Dental Screening: Recommended annual dental exams for proper oral hygiene  Community Resource Referral / Chronic Care Management: CRR required this visit?  No   CCM required this visit?  No     Plan:     I have personally reviewed and noted the following in the patient's chart:   Medical and social history Use of alcohol, tobacco or illicit drugs  Current medications and supplements including opioid prescriptions. Patient is not currently taking opioid prescriptions. Functional ability and status Nutritional status Physical activity Advanced directives List of other physicians Hospitalizations, surgeries, and ER visits in previous 12 months Vitals Screenings to include cognitive, depression, and falls Referrals and  appointments  In addition, I have reviewed and discussed with patient certain preventive protocols, quality metrics, and best practice recommendations. A written personalized care plan for preventive services as well as general preventive health recommendations were provided to patient.     Arta Silence, CMA   07/04/2023   After Visit Summary: (MyChart) Due to this being a telephonic visit, the after visit summary with patients personalized plan was offered to patient via MyChart   Notes: Here is a reminder for you to get your Tetanus and Pneumonia vaccine at your next visit.

## 2023-07-20 ENCOUNTER — Other Ambulatory Visit: Payer: Self-pay | Admitting: Internal Medicine

## 2023-08-08 ENCOUNTER — Inpatient Hospital Stay: Payer: Medicare Other | Attending: Oncology

## 2023-08-08 DIAGNOSIS — Z800 Family history of malignant neoplasm of digestive organs: Secondary | ICD-10-CM | POA: Diagnosis not present

## 2023-08-08 DIAGNOSIS — Z803 Family history of malignant neoplasm of breast: Secondary | ICD-10-CM | POA: Diagnosis not present

## 2023-08-08 LAB — CBC WITH DIFFERENTIAL (CANCER CENTER ONLY)
Abs Immature Granulocytes: 0.13 10*3/uL — ABNORMAL HIGH (ref 0.00–0.07)
Basophils Absolute: 0.1 10*3/uL (ref 0.0–0.1)
Basophils Relative: 1 %
Eosinophils Absolute: 0.5 10*3/uL (ref 0.0–0.5)
Eosinophils Relative: 6 %
HCT: 41.5 % (ref 36.0–46.0)
Hemoglobin: 13.9 g/dL (ref 12.0–15.0)
Immature Granulocytes: 1 %
Lymphocytes Relative: 22 %
Lymphs Abs: 2 10*3/uL (ref 0.7–4.0)
MCH: 32 pg (ref 26.0–34.0)
MCHC: 33.5 g/dL (ref 30.0–36.0)
MCV: 95.6 fL (ref 80.0–100.0)
Monocytes Absolute: 0.6 10*3/uL (ref 0.1–1.0)
Monocytes Relative: 7 %
Neutro Abs: 5.6 10*3/uL (ref 1.7–7.7)
Neutrophils Relative %: 63 %
Platelet Count: 305 10*3/uL (ref 150–400)
RBC: 4.34 MIL/uL (ref 3.87–5.11)
RDW: 12.8 % (ref 11.5–15.5)
WBC Count: 9 10*3/uL (ref 4.0–10.5)
nRBC: 0 % (ref 0.0–0.2)

## 2023-08-08 LAB — HEPATIC FUNCTION PANEL
ALT: 17 U/L (ref 0–44)
AST: 23 U/L (ref 15–41)
Albumin: 3.9 g/dL (ref 3.5–5.0)
Alkaline Phosphatase: 73 U/L (ref 38–126)
Bilirubin, Direct: <0.1 mg/dL (ref 0.0–0.2)
Total Bilirubin: 0.7 mg/dL (ref 0.0–1.2)
Total Protein: 7 g/dL (ref 6.5–8.1)

## 2023-08-08 LAB — IRON AND TIBC
Iron: 153 ug/dL (ref 28–170)
Saturation Ratios: 50 % — ABNORMAL HIGH (ref 10.4–31.8)
TIBC: 304 ug/dL (ref 250–450)
UIBC: 151 ug/dL

## 2023-08-08 LAB — FERRITIN: Ferritin: 62 ng/mL (ref 11–307)

## 2023-08-10 ENCOUNTER — Encounter: Payer: Self-pay | Admitting: Oncology

## 2023-08-10 ENCOUNTER — Inpatient Hospital Stay: Payer: Medicare Other

## 2023-08-10 ENCOUNTER — Inpatient Hospital Stay (HOSPITAL_BASED_OUTPATIENT_CLINIC_OR_DEPARTMENT_OTHER): Payer: Medicare Other | Admitting: Oncology

## 2023-08-10 DIAGNOSIS — Z803 Family history of malignant neoplasm of breast: Secondary | ICD-10-CM | POA: Diagnosis not present

## 2023-08-10 DIAGNOSIS — Z800 Family history of malignant neoplasm of digestive organs: Secondary | ICD-10-CM | POA: Diagnosis not present

## 2023-08-10 MED ORDER — SODIUM CHLORIDE 0.9 % IV SOLN
Freq: Once | INTRAVENOUS | Status: DC
Start: 2023-08-10 — End: 2023-08-10
  Filled 2023-08-10: qty 250

## 2023-08-10 NOTE — Patient Instructions (Signed)

## 2023-08-10 NOTE — Assessment & Plan Note (Addendum)
 Homozygous hemochromatosis mutation/iron overload Labs reviewed and discussed with patient.  LFTs stable. Lab Results  Component Value Date   HGB 13.9 08/08/2023   TIBC 304 08/08/2023   IRONPCTSAT 50 (H) 08/08/2023   FERRITIN 62 08/08/2023     Ferritin is > 50,  iron saturation elevation.  Recommend phlebotomy today. Avoid iron supplementation, alcohol vitamin C supplementation.

## 2023-08-10 NOTE — Progress Notes (Signed)
 Hematology/Oncology Progress note [phone] [phone]      Patient Care Team: Thersia Flax, MD as PCP - General (Internal Medicine) Thersia Flax, MD (Internal Medicine) Marquita Situ Magali Schmitz, MD (General Surgery) Timmy Forbes, MD as Consulting Physician (Hematology and Oncology)  ASSESSMENT & PLAN:   Hemochromatosis, hereditary [facility]) Homozygous hemochromatosis mutation/iron overload Labs reviewed and discussed with patient.  LFTs stable. Lab Results  Component Value Date   HGB 13.9 08/08/2023   TIBC 304 08/08/2023   IRONPCTSAT 50 (H) 08/08/2023   FERRITIN 62 08/08/2023     Ferritin is > 50,  iron saturation elevation.  Recommend phlebotomy today. Avoid iron supplementation, alcohol vitamin C supplementation.   Orders Placed This Encounter  Procedures   Hepatic function panel    Standing Status:   Future    Expected Date:   02/10/2024    Expiration Date:   08/09/2024   Iron and TIBC    Standing Status:   Future    Expected Date:   02/10/2024    Expiration Date:   08/09/2024   Ferritin    Standing Status:   Future    Expected Date:   02/10/2024    Expiration Date:   08/09/2024   Follow-up in 6 months All questions were answered. The patient knows to call the clinic with any problems, questions or concerns.  Timmy Forbes, MD, PhD [facility] Hematology Oncology 08/10/2023   CHIEF COMPLAINTS/REASON FOR VISIT:  Patient presented to follow-up for management of hemochromatosis and iron overload.  HISTORY OF PRESENTING ILLNESS:  Rhonda Brock is a  68 y.o.  female with PMH listed below who was presents for follow up of hereditary hemochromatosis, homozygous C282Y mutation. #   She has followed up with gastroenterologist and has received hepatitis vaccination.  Was recommended no need for liver biopsy  Not able to donate blood due to being on Proscar for alopecia  INTERVAL HISTORY Rhonda Brock is a 68 y.o. female who has above history  reviewed by me today presents for follow up visit for management of hereditary hemochromatosis with iron overload. Patient reports feeling well.  She has no new complaints. She has no new complaints. . Review of Systems  Constitutional:  Negative for appetite change, chills, fatigue and fever.  HENT:   Negative for hearing loss and voice change.        Hair loss  Eyes:  Negative for eye problems.  Respiratory:  Negative for chest tightness and cough.   Cardiovascular:  Negative for chest pain.  Gastrointestinal:  Negative for abdominal distention, abdominal pain and blood in stool.  Endocrine: Negative for hot flashes.  Genitourinary:  Negative for difficulty urinating and frequency.   Musculoskeletal:  Negative for arthralgias.  Skin:  Negative for itching and rash.  Neurological:  Negative for extremity weakness.  Hematological:  Negative for adenopathy.  Psychiatric/Behavioral:  Negative for confusion.     MEDICAL HISTORY:  Past Medical History:  Diagnosis Date   Anxiety    Back pain    Chronic headaches    Cyst (solitary) of breast    bilaterally   GERD (gastroesophageal reflux disease)    Hemochromatosis, hereditary (HCC) 03/22/2018   Hypertension     SURGICAL HISTORY: Past Surgical History:  Procedure Laterality Date   ABDOMINAL HYSTERECTOMY  2001   APPENDECTOMY  1962   breast cysts     BREAST SURGERY  2004, 2005   left breast cyst drained x 2   CHOLECYSTECTOMY  06/2019  COLONOSCOPY  06/08/2006   COLONOSCOPY WITH PROPOFOL  N/A 06/29/2016   Procedure: COLONOSCOPY WITH PROPOFOL ;  Surgeon: Marshall Skeeter, MD;  Location: [facility] ENDOSCOPY;  Service: Endoscopy;  Laterality: N/A;   TONSILLECTOMY  1970    SOCIAL HISTORY: Social History   Socioeconomic History   Marital status: Married    Spouse name: Not on file   Number of children: Not on file   Years of education: 14   Highest education level: Associate degree: occupational, Scientist, product/process development, or vocational program   Occupational History   Occupation: Homemaker  Tobacco Use   Smoking status: Never   Smokeless tobacco: Never  Vaping Use   Vaping status: Never Used  Substance and Sexual Activity   Alcohol use: No   Drug use: No   Sexual activity: Not on file  Other Topics Concern   Not on file  Social History Narrative   Regular exercise-yes   Caffeine use-yes         Social Drivers of Health   Financial Resource Strain: Low Risk  (07/04/2023)   Overall Financial Resource Strain (CARDIA)    Difficulty of Paying Living Expenses: Not hard at all  Food Insecurity: No Food Insecurity (07/04/2023)   Hunger Vital Sign    Worried About Running Out of Food in the Last Year: Never true    Ran Out of Food in the Last Year: Never true  Transportation Needs: No Transportation Needs (07/04/2023)   PRAPARE - Administrator, Civil Service (Medical): No    Lack of Transportation (Non-Medical): No  Physical Activity: Insufficiently Active (07/04/2023)   Exercise Vital Sign    Days of Exercise per Week: 3 days    Minutes of Exercise per Session: 40 min  Stress: No Stress Concern Present (07/04/2023)   [employer] of Occupational Health - Occupational Stress Questionnaire    Feeling of Stress : Not at all  Social Connections: Moderately Integrated (07/04/2023)   Social Connection and Isolation Panel [NHANES]    Frequency of Communication with Friends and Family: More than three times a week    Frequency of Social Gatherings with Friends and Family: More than three times a week    Attends Religious Services: More than 4 times per year    Active Member of [employer] or Organizations: Yes    Attends Banker Meetings: More than 4 times per year    Marital Status: Widowed  Intimate Partner Violence: Not At Risk (07/04/2023)   Humiliation, Afraid, Rape, and Kick questionnaire    Fear of Current or Ex-Partner: No    Emotionally Abused: No    Physically Abused: No    Sexually Abused: No     FAMILY HISTORY: Family History  Problem Relation Age of Onset   Cancer Mother 26       Breast Cancer-Mastectomy, Also Abdominal cancer   Asthma Father    Diabetes Father     ALLERGIES:  has no known allergies.  MEDICATIONS:  Current Outpatient Medications  Medication Sig Dispense Refill   ALPRAZolam  (XANAX ) 0.25 MG tablet Take 1 tablet (0.25 mg total) by mouth at bedtime as needed for anxiety. 20 tablet 2   calcium carbonate (TUMS - DOSED IN MG ELEMENTAL CALCIUM) 500 MG chewable tablet Chew 1 tablet by mouth daily.     finasteride (PROSCAR) 5 MG tablet Take 2.5 mg by mouth daily.     meloxicam  (MOBIC ) 7.5 MG tablet TAKE 1 TABLET BY MOUTH EVERY DAY AS NEEDED FOR PAIN 30  tablet 1   sertraline  (ZOLOFT ) 50 MG tablet TAKE 1 TABLET (50 MG TOTAL) BY MOUTH DAILY. ` 90 tablet 1   spironolactone  (ALDACTONE ) 25 MG tablet Take 1 tablet (25 mg total) by mouth daily. 90 tablet 1   telmisartan  (MICARDIS ) 20 MG tablet Take 1 tablet (20 mg total) by mouth daily. 90 tablet 1   No current facility-administered medications for this visit.     PHYSICAL EXAMINATION: ECOG PERFORMANCE STATUS: 0 - Asymptomatic Vitals:   08/10/23 1348  BP: 116/77  Pulse: 70  Resp: 18  Temp: 97.9 F (36.6 C)   Filed Weights   08/10/23 1348  Weight: 209 lb 11.2 oz (95.1 kg)    Physical Exam Constitutional:      General: She is not in acute distress. HENT:     Head: Normocephalic and atraumatic.  Eyes:     General: No scleral icterus.    Pupils: Pupils are equal, round, and reactive to light.  Cardiovascular:     Rate and Rhythm: Normal rate and regular rhythm.     Heart sounds: Normal heart sounds.  Pulmonary:     Effort: Pulmonary effort is normal. No respiratory distress.     Breath sounds: No wheezing.  Abdominal:     General: Bowel sounds are normal. There is no distension.     Palpations: Abdomen is soft. There is no mass.     Tenderness: There is no abdominal tenderness.  Musculoskeletal:         General: No deformity. Normal range of motion.     Cervical back: Normal range of motion and neck supple.  Skin:    General: Skin is warm and dry.     Findings: No erythema or rash.  Neurological:     Mental Status: She is alert and oriented to person, place, and time. Mental status is at baseline.  Psychiatric:        Mood and Affect: Mood normal.      LABORATORY DATA:  I have reviewed the data as listed Lab Results  Component Value Date   WBC 9.0 08/08/2023   HGB 13.9 08/08/2023   HCT 41.5 08/08/2023   MCV 95.6 08/08/2023   PLT 305 08/08/2023   Recent Labs    09/07/22 1041 12/21/22 1050 02/27/23 1402 08/08/23 0919  NA 137 140 137  --   K 3.6 3.4* 4.1  --   CL 99 101 104  --   CO2 29 31 28   --   GLUCOSE 121* 101* 96  --   BUN 11 14 17   --   CREATININE 0.88 0.85 0.86  --   CALCIUM 9.2 9.7 9.2  --   PROT  --  6.8 6.7 7.0  ALBUMIN  --  4.0 4.0 3.9  AST  --  16 23 23   ALT  --  14 17 17   ALKPHOS  --  68 61 73  BILITOT  --  0.7 0.6 0.7  BILIDIR  --   --   --  <0.1  IBILI  --   --   --  NOT CALCULATED   Lab Results  Component Value Date   IRON 153 08/08/2023   TIBC 304 08/08/2023   IRONPCTSAT 50 (H) 08/08/2023   FERRITIN 62 08/08/2023        RADIOGRAPHIC STUDIES: I have personally reviewed the radiological images as listed and agreed with the findings in the report. No results found.

## 2023-08-10 NOTE — Progress Notes (Signed)
 Rhonda Brock presents today for phlebotomy per MD orders. Phlebotomy procedure started at 1403 and ended at 1412. removed. Patient tolerated procedure well. IV needle removed intact.

## 2023-08-14 ENCOUNTER — Encounter: Payer: Self-pay | Admitting: Internal Medicine

## 2023-08-14 DIAGNOSIS — R7301 Impaired fasting glucose: Secondary | ICD-10-CM

## 2023-08-14 DIAGNOSIS — E782 Mixed hyperlipidemia: Secondary | ICD-10-CM

## 2023-08-14 DIAGNOSIS — I10 Essential (primary) hypertension: Secondary | ICD-10-CM

## 2023-08-14 NOTE — Telephone Encounter (Signed)
 I have labs for pt to have done prior to her 6 month follow up in June.

## 2023-09-08 ENCOUNTER — Encounter: Payer: Self-pay | Admitting: Internal Medicine

## 2023-09-11 NOTE — Telephone Encounter (Signed)
 Healthcare POA has been uploaded to the media tab

## 2023-09-15 ENCOUNTER — Other Ambulatory Visit

## 2023-09-15 DIAGNOSIS — E782 Mixed hyperlipidemia: Secondary | ICD-10-CM

## 2023-09-15 DIAGNOSIS — I10 Essential (primary) hypertension: Secondary | ICD-10-CM | POA: Diagnosis not present

## 2023-09-15 DIAGNOSIS — R7301 Impaired fasting glucose: Secondary | ICD-10-CM | POA: Diagnosis not present

## 2023-09-15 LAB — COMPREHENSIVE METABOLIC PANEL WITH GFR
ALT: 16 U/L (ref 0–35)
AST: 20 U/L (ref 0–37)
Albumin: 4.1 g/dL (ref 3.5–5.2)
Alkaline Phosphatase: 59 U/L (ref 39–117)
BUN: 21 mg/dL (ref 6–23)
CO2: 27 meq/L (ref 19–32)
Calcium: 9.4 mg/dL (ref 8.4–10.5)
Chloride: 103 meq/L (ref 96–112)
Creatinine, Ser: 0.89 mg/dL (ref 0.40–1.20)
GFR: 66.66 mL/min (ref >60.00)
Glucose, Bld: 114 mg/dL — ABNORMAL HIGH (ref 70–99)
Potassium: 4.1 meq/L (ref 3.5–5.1)
Sodium: 138 meq/L (ref 135–145)
Total Bilirubin: 0.6 mg/dL (ref 0.2–1.2)
Total Protein: 6.8 g/dL (ref 6.0–8.3)

## 2023-09-15 LAB — LIPID PANEL
Cholesterol: 190 mg/dL (ref 0–200)
HDL: 47.2 mg/dL (ref >39.00)
LDL Cholesterol: 99 mg/dL (ref 0–99)
NonHDL: 143.24
Total CHOL/HDL Ratio: 4
Triglycerides: 219 mg/dL — ABNORMAL HIGH (ref 0.0–149.0)
VLDL: 43.8 mg/dL — ABNORMAL HIGH (ref 0.0–40.0)

## 2023-09-15 LAB — HEMOGLOBIN A1C: Hgb A1c MFr Bld: 5.5 % (ref 4.6–6.5)

## 2023-09-15 LAB — LDL CHOLESTEROL, DIRECT: Direct LDL: 127 mg/dL

## 2023-09-18 ENCOUNTER — Ambulatory Visit: Payer: Self-pay | Admitting: Internal Medicine

## 2023-09-18 NOTE — Telephone Encounter (Signed)
 Copied from CRM [id number]. Topic: Clinical - Request for Lab/Test Order >> Sep 18, 2023  1:35 PM Henretta I wrote: Reason for CRM:  Patient is calling regarding labs just done last week  and states she did not fast for them and was just wanting to know if they can be re-done due to her actually needing to fast. Patient also sent message through [place].

## 2023-09-19 ENCOUNTER — Encounter: Payer: Self-pay | Admitting: Internal Medicine

## 2023-09-26 ENCOUNTER — Ambulatory Visit (INDEPENDENT_AMBULATORY_CARE_PROVIDER_SITE_OTHER): Admitting: Internal Medicine

## 2023-09-26 ENCOUNTER — Encounter: Payer: Self-pay | Admitting: Internal Medicine

## 2023-09-26 VITALS — BP 122/74 | HR 71 | Ht 65.0 in | Wt 212.2 lb

## 2023-09-26 DIAGNOSIS — I10 Essential (primary) hypertension: Secondary | ICD-10-CM

## 2023-09-26 DIAGNOSIS — E782 Mixed hyperlipidemia: Secondary | ICD-10-CM

## 2023-09-26 DIAGNOSIS — R7301 Impaired fasting glucose: Secondary | ICD-10-CM

## 2023-09-26 DIAGNOSIS — F4321 Adjustment disorder with depressed mood: Secondary | ICD-10-CM

## 2023-09-26 DIAGNOSIS — G8929 Other chronic pain: Secondary | ICD-10-CM

## 2023-09-26 DIAGNOSIS — R5383 Other fatigue: Secondary | ICD-10-CM | POA: Diagnosis not present

## 2023-09-26 DIAGNOSIS — Z23 Encounter for immunization: Secondary | ICD-10-CM | POA: Diagnosis not present

## 2023-09-26 DIAGNOSIS — M25562 Pain in left knee: Secondary | ICD-10-CM

## 2023-09-26 MED ORDER — SPIRONOLACTONE 25 MG PO TABS: 25.0000 mg | ORAL_TABLET | Freq: Every day | ORAL | 1 refills | Status: DC

## 2023-09-26 MED ORDER — SERTRALINE HCL 50 MG PO TABS: 50.0000 mg | ORAL_TABLET | Freq: Every day | ORAL | 1 refills | Status: DC

## 2023-09-26 MED ORDER — TELMISARTAN 20 MG PO TABS: 20.0000 mg | ORAL_TABLET | Freq: Every day | ORAL | 1 refills | Status: DC

## 2023-09-26 NOTE — Assessment & Plan Note (Signed)
 She has moderate medial compartment joint space narrrowing and spurring .  Advised to monitor BP while taking meloxicam 

## 2023-09-26 NOTE — Assessment & Plan Note (Signed)
 Well controlled on current regimen of telmisartan  20 mg and spironolactone  25 mg daily.     Reminded to suspend medication during any GI illness that would lead to dehydration  And to report any recurrent muscle cramping  Lab Results  Component Value Date   CREATININE 0.89 09/15/2023   Lab Results  Component Value Date   NA 138 09/15/2023   K 4.1 09/15/2023   CL 103 09/15/2023   CO2 27 09/15/2023

## 2023-09-26 NOTE — Assessment & Plan Note (Signed)
 Patient is dealing with the expected loss of spouse and has adequate coping skills and emotional support .  i have asked patinet to return in one month to examine for signs of unresolving grief.

## 2023-09-26 NOTE — Assessment & Plan Note (Signed)
 10 yr risk of CAD using the AHA risk calculator is 10%.  Patient has no incidental evidence of atherosclerosis .  No treatment advised at this time except Red Yeast rice.  Rtc 3 months  Lab Results  Component Value Date   CHOL 190 09/15/2023   HDL 47.20 09/15/2023   LDLCALC 99 09/15/2023   LDLDIRECT 127.0 09/15/2023   TRIG 219.0 (H) 09/15/2023   CHOLHDL 4 09/15/2023

## 2023-09-26 NOTE — Progress Notes (Signed)
 Subjective:  Patient ID: Rhonda Brock, female    DOB: May 31, 1955  Age: 68 y.o. MRN: [id number]  CC: The primary encounter diagnosis was Hypertension, essential. Diagnoses of Mixed hyperlipidemia, Iron overload, Impaired fasting glucose, Other fatigue, Chronic pain of left knee, Need for pneumococcal 20-valent conjugate vaccination, and Grief were also pertinent to this visit.   HPI Rhonda Brock presents for  Chief Complaint  Patient presents with   Medical Management of Chronic Issues    6 month follow up    1) YUW:Ejupzwu is taking her medications as prescribed and notes no adverse effects.  Home BP readings have been done about once per week and are  generally < 120/0 .  She is avoiding added salt in her diet and walking regularly about 3 times per week for exercise  .   2) left knee pain : mild, medial,  aggravated by walking.   Improves with meloxciam transiently.    3) HM: overdue due for Prevnar 20, Tdap  4) Grief:  she lost her beloved husband Glean recently to dementia and has been increasing her social contacts with relationships with another widow that she is in daily contact with   Outpatient Medications Prior to Visit  Medication Sig Dispense Refill   ALPRAZolam  (XANAX ) 0.25 MG tablet Take 1 tablet (0.25 mg total) by mouth at bedtime as needed for anxiety. 20 tablet 2   calcium carbonate (TUMS - DOSED IN MG ELEMENTAL CALCIUM) 500 MG chewable tablet Chew 1 tablet by mouth daily.     finasteride (PROSCAR) 5 MG tablet Take 2.5 mg by mouth daily.     meloxicam  (MOBIC ) 7.5 MG tablet TAKE 1 TABLET BY MOUTH EVERY DAY AS NEEDED FOR PAIN 30 tablet 1   sertraline  (ZOLOFT ) 50 MG tablet TAKE 1 TABLET (50 MG TOTAL) BY MOUTH DAILY. ` 90 tablet 1   spironolactone  (ALDACTONE ) 25 MG tablet Take 1 tablet (25 mg total) by mouth daily. 90 tablet 1   telmisartan  (MICARDIS ) 20 MG tablet Take 1 tablet (20 mg total) by mouth daily. 90 tablet 1   No facility-administered medications prior  to visit.    Review of Systems;  Patient denies headache, fevers, malaise, unintentional weight loss, skin rash, eye pain, sinus congestion and sinus pain, sore throat, dysphagia,  hemoptysis , cough, dyspnea, wheezing, chest pain, palpitations, orthopnea, edema, abdominal pain, nausea, melena, diarrhea, constipation, flank pain, dysuria, hematuria, urinary  Frequency, nocturia, numbness, tingling, seizures,  Focal weakness, Loss of consciousness,  Tremor, insomnia, depression, anxiety, and suicidal ideation.      Objective:  BP 122/74   Pulse 71   Ht 5' 5 (1.651 m)   Wt 212 lb 3.2 oz (96.3 kg)   SpO2 98%   BMI 35.31 kg/m   BP Readings from Last 3 Encounters:  09/26/23 122/74  08/10/23 117/72  08/10/23 116/77    Wt Readings from Last 3 Encounters:  09/26/23 212 lb 3.2 oz (96.3 kg)  08/10/23 209 lb 11.2 oz (95.1 kg)  07/04/23 197 lb (89.4 kg)    Physical Exam Vitals reviewed.  Constitutional:      General: She is not in acute distress.    Appearance: Normal appearance. She is obese. She is not ill-appearing, toxic-appearing or diaphoretic.  HENT:     Head: Normocephalic.   Eyes:     General: No scleral icterus.       Right eye: No discharge.        Left eye: No discharge.  Conjunctiva/sclera: Conjunctivae normal.    Cardiovascular:     Rate and Rhythm: Normal rate and regular rhythm.     Heart sounds: Normal heart sounds.  Pulmonary:     Effort: Pulmonary effort is normal. No respiratory distress.     Breath sounds: Normal breath sounds.   Musculoskeletal:        General: Normal range of motion.   Skin:    General: Skin is warm and dry.   Neurological:     General: No focal deficit present.     Mental Status: She is alert and oriented to person, place, and time. Mental status is at baseline.   Psychiatric:        Mood and Affect: Mood normal.        Behavior: Behavior normal.        Thought Content: Thought content normal.        Judgment: Judgment  normal.    Lab Results  Component Value Date   HGBA1C 5.5 09/15/2023   HGBA1C 5.8 12/21/2022   HGBA1C 5.6 06/11/2020    Lab Results  Component Value Date   CREATININE 0.89 09/15/2023   CREATININE 0.86 02/27/2023   CREATININE 0.85 12/21/2022    Lab Results  Component Value Date   WBC 9.0 08/08/2023   HGB 13.9 08/08/2023   HCT 41.5 08/08/2023   PLT 305 08/08/2023   GLUCOSE 114 (H) 09/15/2023   CHOL 190 09/15/2023   TRIG 219.0 (H) 09/15/2023   HDL 47.20 09/15/2023   LDLDIRECT 127.0 09/15/2023   LDLCALC 99 09/15/2023   ALT 16 09/15/2023   AST 20 09/15/2023   NA 138 09/15/2023   K 4.1 09/15/2023   CL 103 09/15/2023   CREATININE 0.89 09/15/2023   BUN 21 09/15/2023   CO2 27 09/15/2023   TSH 1.57 12/21/2022   HGBA1C 5.5 09/15/2023    No results found.  Assessment & Plan:  .Hypertension, essential Assessment & Plan: Well controlled on current regimen of telmisartan  20 mg and spironolactone  25 mg daily.     Reminded to suspend medication during any GI illness that would lead to dehydration  And to report any recurrent muscle cramping  Lab Results  Component Value Date   CREATININE 0.89 09/15/2023   Lab Results  Component Value Date   NA 138 09/15/2023   K 4.1 09/15/2023   CL 103 09/15/2023   CO2 27 09/15/2023     Orders: -     Comprehensive metabolic panel with GFR; Future  Mixed hyperlipidemia Assessment & Plan: 10 yr risk of CAD using the AHA risk calculator is 10%.  Patient has no incidental evidence of atherosclerosis .  No treatment advised at this time except Red Yeast rice.  Rtc 3 months  Lab Results  Component Value Date   CHOL 190 09/15/2023   HDL 47.20 09/15/2023   LDLCALC 99 09/15/2023   LDLDIRECT 127.0 09/15/2023   TRIG 219.0 (H) 09/15/2023   CHOLHDL 4 09/15/2023     Orders: -     Lipid panel; Future -     LDL cholesterol, direct; Future  Iron overload -     CBC with Differential/Platelet; Future  Impaired fasting glucose -      Comprehensive metabolic panel with GFR; Future  Other fatigue  Chronic pain of left knee Assessment & Plan: She has moderate medial compartment joint space narrrowing and spurring .  Advised to monitor BP while taking meloxicam     Need for pneumococcal 20-valent conjugate vaccination -  Pneumococcal conjugate vaccine 20-valent  Grief Assessment & Plan: Patient is dealing with the expected loss of spouse and has adequate coping skills and emotional support .  i have asked patinet to return in one month to examine for signs of unresolving grief.     Other orders -     Sertraline  HCl; Take 1 tablet (50 mg total) by mouth daily. `  Dispense: 90 tablet; Refill: 1 -     Spironolactone ; Take 1 tablet (25 mg total) by mouth daily.  Dispense: 90 tablet; Refill: 1 -     Telmisartan ; Take 1 tablet (20 mg total) by mouth daily.  Dispense: 90 tablet; Refill: 1     I spent 34 minutes on the day of this face to face encounter reviewing patient's   prior relevant surgical and non surgical procedures, recent  labs and imaging studies, counseling on grief management,  reviewing the assessment and plan with patient, and post visit ordering and reviewing of  diagnostics and therapeutics with patient  .   Follow-up: Return in about 3 months (around 12/27/2023).   Verneita LITTIE Kettering, MD

## 2023-09-26 NOTE — Patient Instructions (Addendum)
 YOUR BP IS EXCELLENT. PLEASE CHECK BP WHILE TAKING MELOXICAM   SO WE CAN SEE IF IT IS SAFE FOR YOU TO USE MORE OFTEN   THE DEGNERATIVE CHANGES IN YOUR KNEE HAVE RESULTED IN BONE SPURS  KEEP YOUR THIGH MUSCLES STRONG WITH THE EXERCISE I SHOWED YOU   OK TO USE 3000 MG TYLENOL  IN DIVIDED DOSES (1000 MG EVERY 8 TO 12 HOURS)  ICE IS A GREAT ANTI INFLAMMATORY    Based on your fasting cholesterol and your concurrent history of hypertension ,  your 10 year risk of having some type of coronary event (including heart attack) is 10%  , meaning that one of  every 10 women with the same medical statistics will have a heart attack or stroke  in the next 10 years.    MEDIUM POTENCY STATIN THERAPY IS RECOMMENDED   The natural remedies for cholesterol have not been proven to reduce your risk for a heart attack.  Red Yeast Rice has not been proven either,  But does lower cholesterol, so if you want to try it , the dose is 600 mg twice daily in capsule form, available OTC.

## 2023-09-27 ENCOUNTER — Encounter: Payer: Self-pay | Admitting: Internal Medicine

## 2023-12-23 ENCOUNTER — Other Ambulatory Visit: Payer: Self-pay | Admitting: Family

## 2023-12-23 DIAGNOSIS — M79604 Pain in right leg: Secondary | ICD-10-CM

## 2024-01-08 DIAGNOSIS — H2513 Age-related nuclear cataract, bilateral: Secondary | ICD-10-CM | POA: Diagnosis not present

## 2024-01-08 DIAGNOSIS — H43813 Vitreous degeneration, bilateral: Secondary | ICD-10-CM | POA: Diagnosis not present

## 2024-01-11 DIAGNOSIS — D225 Melanocytic nevi of trunk: Secondary | ICD-10-CM | POA: Diagnosis not present

## 2024-01-11 DIAGNOSIS — L659 Nonscarring hair loss, unspecified: Secondary | ICD-10-CM | POA: Diagnosis not present

## 2024-01-11 DIAGNOSIS — D2272 Melanocytic nevi of left lower limb, including hip: Secondary | ICD-10-CM | POA: Diagnosis not present

## 2024-01-11 DIAGNOSIS — D2261 Melanocytic nevi of right upper limb, including shoulder: Secondary | ICD-10-CM | POA: Diagnosis not present

## 2024-01-11 DIAGNOSIS — D2262 Melanocytic nevi of left upper limb, including shoulder: Secondary | ICD-10-CM | POA: Diagnosis not present

## 2024-01-11 DIAGNOSIS — D170 Benign lipomatous neoplasm of skin and subcutaneous tissue of head, face and neck: Secondary | ICD-10-CM | POA: Diagnosis not present

## 2024-01-11 DIAGNOSIS — D2271 Melanocytic nevi of right lower limb, including hip: Secondary | ICD-10-CM | POA: Diagnosis not present

## 2024-01-11 DIAGNOSIS — L821 Other seborrheic keratosis: Secondary | ICD-10-CM | POA: Diagnosis not present

## 2024-01-12 ENCOUNTER — Encounter: Payer: Self-pay | Admitting: Internal Medicine

## 2024-02-06 ENCOUNTER — Other Ambulatory Visit: Payer: Self-pay

## 2024-02-06 ENCOUNTER — Inpatient Hospital Stay: Attending: Oncology

## 2024-02-06 DIAGNOSIS — Z800 Family history of malignant neoplasm of digestive organs: Secondary | ICD-10-CM | POA: Diagnosis not present

## 2024-02-06 DIAGNOSIS — Z803 Family history of malignant neoplasm of breast: Secondary | ICD-10-CM | POA: Insufficient documentation

## 2024-02-06 LAB — CBC WITH DIFFERENTIAL (CANCER CENTER ONLY)
Abs Immature Granulocytes: 0.13 K/uL — ABNORMAL HIGH (ref 0.00–0.07)
Basophils Absolute: 0.1 K/uL (ref 0.0–0.1)
Basophils Relative: 1 %
Eosinophils Absolute: 0.3 K/uL (ref 0.0–0.5)
Eosinophils Relative: 3 %
HCT: 41.8 % (ref 36.0–46.0)
Hemoglobin: 14 g/dL (ref 12.0–15.0)
Immature Granulocytes: 1 %
Lymphocytes Relative: 20 %
Lymphs Abs: 1.9 K/uL (ref 0.7–4.0)
MCH: 32 pg (ref 26.0–34.0)
MCHC: 33.5 g/dL (ref 30.0–36.0)
MCV: 95.4 fL (ref 80.0–100.0)
Monocytes Absolute: 0.6 K/uL (ref 0.1–1.0)
Monocytes Relative: 7 %
Neutro Abs: 6.4 K/uL (ref 1.7–7.7)
Neutrophils Relative %: 68 %
Platelet Count: 322 K/uL (ref 150–400)
RBC: 4.38 MIL/uL (ref 3.87–5.11)
RDW: 12.9 % (ref 11.5–15.5)
WBC Count: 9.4 K/uL (ref 4.0–10.5)
nRBC: 0 % (ref 0.0–0.2)

## 2024-02-06 LAB — IRON AND TIBC
Iron: 162 ug/dL (ref 28–170)
Saturation Ratios: 53 % — ABNORMAL HIGH (ref 10.4–31.8)
TIBC: 307 ug/dL (ref 250–450)
UIBC: 145 ug/dL

## 2024-02-06 LAB — HEPATIC FUNCTION PANEL
ALT: 20 U/L (ref 0–44)
AST: 23 U/L (ref 15–41)
Albumin: 3.9 g/dL (ref 3.5–5.0)
Alkaline Phosphatase: 63 U/L (ref 38–126)
Bilirubin, Direct: 0.2 mg/dL (ref 0.0–0.2)
Indirect Bilirubin: 0.7 mg/dL (ref 0.3–0.9)
Total Bilirubin: 0.9 mg/dL (ref 0.0–1.2)
Total Protein: 7.2 g/dL (ref 6.5–8.1)

## 2024-02-06 LAB — FERRITIN: Ferritin: 119 ng/mL (ref 11–307)

## 2024-02-08 ENCOUNTER — Encounter: Payer: Self-pay | Admitting: Oncology

## 2024-02-08 ENCOUNTER — Inpatient Hospital Stay (HOSPITAL_BASED_OUTPATIENT_CLINIC_OR_DEPARTMENT_OTHER): Admitting: Oncology

## 2024-02-08 ENCOUNTER — Inpatient Hospital Stay

## 2024-02-08 NOTE — Patient Instructions (Signed)

## 2024-02-08 NOTE — Progress Notes (Signed)
 Hematology/Oncology Progress note [phone] [phone]      Patient Care Team: Marylynn Verneita CROME, MD as PCP - General (Internal Medicine) Marylynn Verneita CROME, MD (Internal Medicine) Dessa Reyes ORN, MD (General Surgery) Babara Call, MD as Consulting Physician (Hematology and Oncology)  ASSESSMENT & PLAN:   Hemochromatosis, hereditary Homozygous hemochromatosis mutation/iron overload Labs reviewed and discussed with patient.  LFTs stable. Lab Results  Component Value Date   HGB 14.0 02/06/2024   TIBC 307 02/06/2024   IRONPCTSAT 53 (H) 02/06/2024   FERRITIN 119 02/06/2024     Ferritin is > 50,  iron saturation elevation.  Recommend phlebotomy today, and repeat another phlebotomy session in Jan 2026 Avoid iron supplementation, alcohol vitamin C supplementation.   Orders Placed This Encounter  Procedures   CBC with Differential (Cancer Center Only)    Standing Status:   Future    Expected Date:   08/07/2024    Expiration Date:   11/05/2024   Iron and TIBC    Standing Status:   Future    Expected Date:   08/07/2024    Expiration Date:   11/05/2024   Ferritin    Standing Status:   Future    Expected Date:   08/07/2024    Expiration Date:   11/05/2024   Hepatic function panel    Standing Status:   Future    Expected Date:   08/07/2024    Expiration Date:   11/05/2024   Hemoglobin and Hematocrit (Cancer Center Only)    Standing Status:   Future    Expected Date:   04/09/2024    Expiration Date:   07/08/2024   Follow-up in 6 months All questions were answered. The patient knows to call the clinic with any problems, questions or concerns.  Call Babara, MD, PhD [facility] Hematology Oncology 02/08/2024   CHIEF COMPLAINTS/REASON FOR VISIT:  Patient presented to follow-up for management of hemochromatosis and iron overload.  HISTORY OF PRESENTING ILLNESS:  Rhonda Brock is a  68 y.o.  female with PMH listed below who was presents for follow up of  hereditary hemochromatosis, homozygous C282Y mutation. #   She has followed up with gastroenterologist and has received hepatitis vaccination.  Was recommended no need for liver biopsy  Not able to donate blood due to being on Proscar for alopecia  INTERVAL HISTORY Rhonda Brock is a 68 y.o. female who has above history reviewed by me today presents for follow up visit for management of hereditary hemochromatosis with iron overload. Patient reports feeling well.  She has no new complaints. She has no new complaints. . Review of Systems  Constitutional:  Negative for appetite change, chills, fatigue and fever.  HENT:   Negative for hearing loss and voice change.   Eyes:  Negative for eye problems.  Respiratory:  Negative for chest tightness and cough.   Cardiovascular:  Negative for chest pain.  Gastrointestinal:  Negative for abdominal distention, abdominal pain and blood in stool.  Endocrine: Negative for hot flashes.  Genitourinary:  Negative for difficulty urinating and frequency.   Musculoskeletal:  Negative for arthralgias.  Skin:  Negative for itching and rash.  Neurological:  Negative for extremity weakness.  Hematological:  Negative for adenopathy.  Psychiatric/Behavioral:  Negative for confusion.     MEDICAL HISTORY:  Past Medical History:  Diagnosis Date   Anxiety    Back pain    Chronic headaches    Cyst (solitary) of breast    bilaterally   GERD (  gastroesophageal reflux disease)    Hemochromatosis, hereditary 03/22/2018   Hypertension     SURGICAL HISTORY: Past Surgical History:  Procedure Laterality Date   ABDOMINAL HYSTERECTOMY  2001   APPENDECTOMY  1962   breast cysts     BREAST SURGERY  2004, 2005   left breast cyst drained x 2   CHOLECYSTECTOMY  06/2019   COLONOSCOPY  06/08/2006   COLONOSCOPY WITH PROPOFOL  N/A 06/29/2016   Procedure: COLONOSCOPY WITH PROPOFOL ;  Surgeon: Reyes LELON Cota, MD;  Location: [facility] ENDOSCOPY;  Service: Endoscopy;  Laterality:  N/A;   TONSILLECTOMY  1970    SOCIAL HISTORY: Social History   Socioeconomic History   Marital status: Married    Spouse name: Not on file   Number of children: Not on file   Years of education: 14   Highest education level: Associate degree: occupational, scientist, product/process development, or vocational program  Occupational History   Occupation: Homemaker  Tobacco Use   Smoking status: Never   Smokeless tobacco: Never  Vaping Use   Vaping status: Never Used  Substance and Sexual Activity   Alcohol use: No   Drug use: No   Sexual activity: Not on file  Other Topics Concern   Not on file  Social History Narrative   Regular exercise-yes   Caffeine use-yes         Social Drivers of Health   Financial Resource Strain: Low Risk  (09/22/2023)   Overall Financial Resource Strain (CARDIA)    Difficulty of Paying Living Expenses: Not hard at all  Food Insecurity: No Food Insecurity (09/22/2023)   Hunger Vital Sign    Worried About Running Out of Food in the Last Year: Never true    Ran Out of Food in the Last Year: Never true  Transportation Needs: No Transportation Needs (09/22/2023)   PRAPARE - Administrator, Civil Service (Medical): No    Lack of Transportation (Non-Medical): No  Physical Activity: Insufficiently Active (09/22/2023)   Exercise Vital Sign    Days of Exercise per Week: 3 days    Minutes of Exercise per Session: 40 min  Stress: No Stress Concern Present (09/22/2023)   [employer] of Occupational Health - Occupational Stress Questionnaire    Feeling of Stress: Only a little  Social Connections: Moderately Isolated (09/22/2023)   Social Connection and Isolation Panel    Frequency of Communication with Friends and Family: More than three times a week    Frequency of Social Gatherings with Friends and Family: Three times a week    Attends Religious Services: More than 4 times per year    Active Member of Clubs or Organizations: No    Attends Banker  Meetings: Not on file    Marital Status: Widowed  Intimate Partner Violence: Not At Risk (07/04/2023)   Humiliation, Afraid, Rape, and Kick questionnaire    Fear of Current or Ex-Partner: No    Emotionally Abused: No    Physically Abused: No    Sexually Abused: No    FAMILY HISTORY: Family History  Problem Relation Age of Onset   Cancer Mother 103       Breast Cancer-Mastectomy, Also Abdominal cancer   Asthma Father    Diabetes Father     ALLERGIES:  has no known allergies.  MEDICATIONS:  Current Outpatient Medications  Medication Sig Dispense Refill   ALPRAZolam  (XANAX ) 0.25 MG tablet Take 1 tablet (0.25 mg total) by mouth at bedtime as needed for anxiety. 20 tablet 2  calcium carbonate (TUMS - DOSED IN MG ELEMENTAL CALCIUM) 500 MG chewable tablet Chew 1 tablet by mouth daily.     finasteride (PROSCAR) 5 MG tablet Take 2.5 mg by mouth daily.     meloxicam  (MOBIC ) 7.5 MG tablet TAKE 1 TABLET BY MOUTH EVERY DAY AS NEEDED FOR PAIN 30 tablet 5   sertraline  (ZOLOFT ) 50 MG tablet Take 1 tablet (50 mg total) by mouth daily. ` 90 tablet 1   spironolactone  (ALDACTONE ) 25 MG tablet Take 1 tablet (25 mg total) by mouth daily. 90 tablet 1   telmisartan  (MICARDIS ) 20 MG tablet Take 1 tablet (20 mg total) by mouth daily. 90 tablet 1   No current facility-administered medications for this visit.     PHYSICAL EXAMINATION: ECOG PERFORMANCE STATUS: 0 - Asymptomatic Vitals:   02/08/24 1351 02/08/24 1357  BP: (!) 129/94 121/65  Pulse: 76   Resp: 18   Temp: 99.1 F (37.3 C)   SpO2: 99%    Filed Weights   02/08/24 1351  Weight: 219 lb 1.6 oz (99.4 kg)    Physical Exam Constitutional:      General: She is not in acute distress. HENT:     Head: Normocephalic and atraumatic.  Eyes:     General: No scleral icterus. Cardiovascular:     Rate and Rhythm: Normal rate and regular rhythm.  Pulmonary:     Effort: Pulmonary effort is normal. No respiratory distress.     Breath sounds:  Normal breath sounds. No wheezing.  Abdominal:     General: Bowel sounds are normal. There is no distension.     Palpations: Abdomen is soft. There is no mass.     Tenderness: There is no abdominal tenderness.  Musculoskeletal:        General: No deformity. Normal range of motion.     Cervical back: Normal range of motion and neck supple.  Skin:    General: Skin is warm and dry.     Findings: No erythema or rash.  Neurological:     Mental Status: She is alert and oriented to person, place, and time. Mental status is at baseline.  Psychiatric:        Mood and Affect: Mood normal.      LABORATORY DATA:  I have reviewed the data as listed Lab Results  Component Value Date   WBC 9.4 02/06/2024   HGB 14.0 02/06/2024   HCT 41.8 02/06/2024   MCV 95.4 02/06/2024   PLT 322 02/06/2024   Recent Labs    02/27/23 1402 08/08/23 0919 09/15/23 0752 02/06/24 0850  NA 137  --  138  --   K 4.1  --  4.1  --   CL 104  --  103  --   CO2 28  --  27  --   GLUCOSE 96  --  114*  --   BUN 17  --  21  --   CREATININE 0.86  --  0.89  --   CALCIUM 9.2  --  9.4  --   PROT 6.7 7.0 6.8 7.2  ALBUMIN 4.0 3.9 4.1 3.9  AST 23 23 20 23   ALT 17 17 16 20   ALKPHOS 61 73 59 63  BILITOT 0.6 0.7 0.6 0.9  BILIDIR  --  <0.1  --  0.2  IBILI  --  NOT CALCULATED  --  0.7   Lab Results  Component Value Date   IRON 162 02/06/2024   TIBC 307 02/06/2024  IRONPCTSAT 53 (H) 02/06/2024   FERRITIN 119 02/06/2024        RADIOGRAPHIC STUDIES: I have personally reviewed the radiological images as listed and agreed with the findings in the report. No results found.

## 2024-02-08 NOTE — Progress Notes (Signed)
 Rhonda Brock presents today for phlebotomy per MD orders. Phlebotomy procedure started at 1435 and ended at 1446. 300 mls  removed. Patient tolerated procedure well. IV needle removed intact.

## 2024-02-08 NOTE — Assessment & Plan Note (Addendum)
 Homozygous hemochromatosis mutation/iron overload Labs reviewed and discussed with patient.  LFTs stable. Lab Results  Component Value Date   HGB 14.0 02/06/2024   TIBC 307 02/06/2024   IRONPCTSAT 53 (H) 02/06/2024   FERRITIN 119 02/06/2024     Ferritin is > 50,  iron saturation elevation.  Recommend phlebotomy today, and repeat another phlebotomy session in Jan 2026 Avoid iron supplementation, alcohol vitamin C supplementation.

## 2024-04-05 ENCOUNTER — Other Ambulatory Visit: Payer: Self-pay | Admitting: Internal Medicine

## 2024-04-09 ENCOUNTER — Inpatient Hospital Stay: Attending: Oncology

## 2024-04-09 ENCOUNTER — Inpatient Hospital Stay

## 2024-04-09 LAB — HEMOGLOBIN AND HEMATOCRIT (CANCER CENTER ONLY)
HCT: 40.2 % (ref 36.0–46.0)
Hemoglobin: 13.6 g/dL (ref 12.0–15.0)

## 2024-04-09 LAB — HM MAMMOGRAPHY

## 2024-04-09 NOTE — Patient Instructions (Signed)

## 2024-04-09 NOTE — Progress Notes (Signed)
 Rhonda Brock presents today for phlebotomy per MD orders. Phlebotomy procedure started at 1340 and ended at 1354. 300 mls removed. Patient tolerated procedure well. IV needle removed intact.

## 2024-04-10 ENCOUNTER — Other Ambulatory Visit: Payer: Self-pay | Admitting: Internal Medicine

## 2024-04-10 ENCOUNTER — Encounter: Payer: Self-pay | Admitting: Internal Medicine

## 2024-04-16 ENCOUNTER — Encounter: Payer: Self-pay | Admitting: Oncology

## 2024-04-16 ENCOUNTER — Telehealth: Payer: Self-pay

## 2024-04-16 ENCOUNTER — Encounter: Payer: Self-pay | Admitting: Internal Medicine

## 2024-04-16 ENCOUNTER — Telehealth: Payer: Self-pay | Admitting: Internal Medicine

## 2024-04-16 ENCOUNTER — Other Ambulatory Visit (HOSPITAL_COMMUNITY): Payer: Self-pay

## 2024-04-16 ENCOUNTER — Ambulatory Visit: Admitting: Internal Medicine

## 2024-04-16 VITALS — BP 110/72 | HR 77 | Ht 65.0 in | Wt 214.2 lb

## 2024-04-16 DIAGNOSIS — R5383 Other fatigue: Secondary | ICD-10-CM | POA: Diagnosis not present

## 2024-04-16 DIAGNOSIS — E782 Mixed hyperlipidemia: Secondary | ICD-10-CM

## 2024-04-16 DIAGNOSIS — Z6835 Body mass index (BMI) 35.0-35.9, adult: Secondary | ICD-10-CM | POA: Diagnosis not present

## 2024-04-16 DIAGNOSIS — I10 Essential (primary) hypertension: Secondary | ICD-10-CM

## 2024-04-16 DIAGNOSIS — M1712 Unilateral primary osteoarthritis, left knee: Secondary | ICD-10-CM

## 2024-04-16 DIAGNOSIS — F411 Generalized anxiety disorder: Secondary | ICD-10-CM | POA: Diagnosis not present

## 2024-04-16 DIAGNOSIS — E876 Hypokalemia: Secondary | ICD-10-CM | POA: Diagnosis not present

## 2024-04-16 LAB — COMPREHENSIVE METABOLIC PANEL WITH GFR
ALT: 18 U/L (ref 3–35)
AST: 19 U/L (ref 5–37)
Albumin: 4.1 g/dL (ref 3.5–5.2)
Alkaline Phosphatase: 67 U/L (ref 39–117)
BUN: 13 mg/dL (ref 6–23)
CO2: 30 meq/L (ref 19–32)
Calcium: 9.5 mg/dL (ref 8.4–10.5)
Chloride: 103 meq/L (ref 96–112)
Creatinine, Ser: 0.85 mg/dL (ref 0.40–1.20)
GFR: 70.15 mL/min
Glucose, Bld: 116 mg/dL — ABNORMAL HIGH (ref 70–99)
Potassium: 4.3 meq/L (ref 3.5–5.1)
Sodium: 138 meq/L (ref 135–145)
Total Bilirubin: 0.6 mg/dL (ref 0.2–1.2)
Total Protein: 6.5 g/dL (ref 6.0–8.3)

## 2024-04-16 LAB — MICROALBUMIN / CREATININE URINE RATIO
Creatinine,U: 194.1 mg/dL
Microalb Creat Ratio: UNDETERMINED mg/g (ref 0.0–30.0)
Microalb, Ur: <0.7 mg/dL

## 2024-04-16 LAB — LDL CHOLESTEROL, DIRECT: Direct LDL: 137 mg/dL

## 2024-04-16 LAB — TSH: TSH: 2.19 u[IU]/mL (ref 0.35–5.50)

## 2024-04-16 LAB — LIPID PANEL
Cholesterol: 195 mg/dL (ref 28–200)
HDL: 47.7 mg/dL
LDL Cholesterol: 94 mg/dL (ref 10–99)
NonHDL: 147.56
Total CHOL/HDL Ratio: 4
Triglycerides: 267 mg/dL — ABNORMAL HIGH (ref 10.0–149.0)
VLDL: 53.4 mg/dL — ABNORMAL HIGH (ref 0.0–40.0)

## 2024-04-16 MED ORDER — TELMISARTAN 20 MG PO TABS: 20.0000 mg | ORAL_TABLET | Freq: Every day | ORAL | 1 refills | Status: AC

## 2024-04-16 MED ORDER — ALPRAZOLAM 0.25 MG PO TABS: 0.2500 mg | ORAL_TABLET | Freq: Every evening | ORAL | 5 refills | Status: AC | PRN

## 2024-04-16 MED ORDER — SERTRALINE HCL 50 MG PO TABS: 100.0000 mg | ORAL_TABLET | Freq: Every day | ORAL | 1 refills | Status: AC

## 2024-04-16 NOTE — Telephone Encounter (Signed)
 Pharmacy Patient Advocate Encounter  Received notification from [facility] that Prior Authorization for ALPRAZolam  0.25MG  tablets  has been APPROVED from 04/16/2024 to 03/27/2025. Ran test claim, Copay is $3.20. This test claim was processed through [facility]- copay amounts may vary at other pharmacies due to pharmacy/plan contracts, or as the patient moves through the different stages of their insurance plan.   PA #/Case ID/Reference #: [id number]

## 2024-04-16 NOTE — Patient Instructions (Addendum)
" °  You are due for your tetanus-diptheria-pertussis vaccine   (TDaP)   You can return here or get it at your pharmacy    Increase zoloft  to 100 mg 2 tablets daily ) ; if in 2 weeks,  anxiety is not better,  let me know   Resume a regular exercise plan!  Both  to burn off nervous energy and to keep legs strong  "

## 2024-04-16 NOTE — Telephone Encounter (Signed)
 Non-Opioid Controlled Substance Agreement signed and scanned 1.20.26

## 2024-04-16 NOTE — Assessment & Plan Note (Signed)
 Moderate tricompartmental disease by prior films currently asymptomatic

## 2024-04-16 NOTE — Assessment & Plan Note (Signed)
 Well controlled on current regimen of telmisartan  20 mg and spironolactone  25 mg daily.     Reminded to suspend medication during any GI illness that would lead to dehydration  And to report any recurrent muscle cramping  Lab Results  Component Value Date   CREATININE 0.89 09/15/2023   Lab Results  Component Value Date   NA 138 09/15/2023   K 4.1 09/15/2023   CL 103 09/15/2023   CO2 27 09/15/2023

## 2024-04-16 NOTE — Telephone Encounter (Signed)
 noted

## 2024-04-16 NOTE — Assessment & Plan Note (Signed)
 Managed by hematology with periodic phlebotomies ,  most recently last week,  to maintain normal iron saturation .  Reminded to Avoid iron supplementation, alcohol vitamin C supplementation.  Lab Results  Component Value Date   WBC 9.4 02/06/2024   HGB 13.6 04/09/2024   HCT 40.2 04/09/2024   MCV 95.4 02/06/2024   PLT 322 02/06/2024   Lab Results  Component Value Date   IRON 162 02/06/2024   TIBC 307 02/06/2024   FERRITIN 119 02/06/2024

## 2024-04-16 NOTE — Assessment & Plan Note (Addendum)
 Complicated by hypertension and DJD knee.   I have addressed  BMI and recommended a low glycemic index diet regular participation in aerobic exercise with a goal of 30 minutes of aerobic exercise a minimum of 5 days per week. Screening for lipid disorders, thyroid  and diabetes  has been done

## 2024-04-16 NOTE — Assessment & Plan Note (Signed)
 Advised to increaser sertraline  to 100 mg as a trial and avoid daily use of alprazolam 

## 2024-04-16 NOTE — Assessment & Plan Note (Signed)
 Now managed with  therapeutic phlebotomy last one Apr 09 2024

## 2024-04-16 NOTE — Telephone Encounter (Signed)
 PA for Alprazolam is needed

## 2024-04-16 NOTE — Assessment & Plan Note (Signed)
 Resolved with addition of spironolactone  to ARB  Lab Results  Component Value Date   NA 138 09/15/2023   K 4.1 09/15/2023   CL 103 09/15/2023   CO2 27 09/15/2023

## 2024-04-16 NOTE — Telephone Encounter (Signed)
 Pt is aware.

## 2024-04-16 NOTE — Telephone Encounter (Signed)
 Pt is aware that medication has been approved through her insurance.

## 2024-04-16 NOTE — Telephone Encounter (Signed)
 Pharmacy Patient Advocate Encounter   Received notification from Physician's Office that prior authorization for Alprazolam  0.25 mg tablet is required/requested.   Insurance verification completed.   The patient is insured through [employer].   Per test claim: PA required; PA submitted to above mentioned insurance via Latent Key/confirmation #/EOC [id number] Status is pending

## 2024-04-16 NOTE — Progress Notes (Signed)
 "  Subjective:  Patient ID: Rhonda Brock, female    DOB: [birth year]  Age: 69 y.o. MRN: [id number]  CC: The primary encounter diagnosis was Hypertension, essential. Diagnoses of Mixed hyperlipidemia, Other fatigue, Hemochromatosis, hereditary, Hypokalemia, Primary osteoarthritis of left knee, Anxiety state, Iron overload, and Obesity, morbid (HCC) were also pertinent to this visit.   HPI Rhonda Brock presents for  Chief Complaint  Patient presents with   Medical Management of Chronic Issues    Follow up on anxiety a lot going on in head   Rhonda Brock is a 69 yr old female with a history of hypertension and anxiety treated thus far with low dose zoloft  taken daily and low dose alprazolam  taken  as needed .  Since jan 1 she has felt more anxious,  feeling that this year she is really on her own since her beloved husband died in 10/02/2023, she has been having increased feelings during the day of chest tightness at rest, that are relieved by alprazolam .  She has been sedentary, and notes that when her husband was being cared for in the memory care unit she took 3  long walks daily to be with him for meals, which she no longer does.  She has some social outlets,  but has not exericsed formally in over a year .  She denies true panic attACKS    2) Hypertension: patient checks blood pressure twice weekly at home.  Readings have been for the most part <130/80 at rest . Patient is following a reduced salt diet most days and is taking medications as prescribed    Outpatient Medications Prior to Visit  Medication Sig Dispense Refill   calcium carbonate (TUMS - DOSED IN MG ELEMENTAL CALCIUM) 500 MG chewable tablet Chew 1 tablet by mouth daily.     finasteride (PROSCAR) 5 MG tablet Take 2.5 mg by mouth daily.     meloxicam  (MOBIC ) 7.5 MG tablet TAKE 1 TABLET BY MOUTH EVERY DAY AS NEEDED FOR PAIN 30 tablet 5   spironolactone  (ALDACTONE ) 25 MG tablet TAKE 1 TABLET (25 MG TOTAL) BY MOUTH DAILY. 90 tablet 1    ALPRAZolam  (XANAX ) 0.25 MG tablet Take 1 tablet (0.25 mg total) by mouth at bedtime as needed for anxiety. 20 tablet 2   sertraline  (ZOLOFT ) 50 MG tablet TAKE 1 TABLET (50 MG TOTAL) BY MOUTH DAILY. ` 90 tablet 1   telmisartan  (MICARDIS ) 20 MG tablet Take 1 tablet (20 mg total) by mouth daily. 90 tablet 1   No facility-administered medications prior to visit.    Review of Systems;  Patient denies headache, fevers, malaise, unintentional weight loss, skin rash, eye pain, sinus congestion and sinus pain, sore throat, dysphagia,  hemoptysis , cough, dyspnea, wheezing, chest pain, palpitations, orthopnea, edema, abdominal pain, nausea, melena, diarrhea, constipation, flank pain, dysuria, hematuria, urinary  Frequency, nocturia, numbness, tingling, seizures,  Focal weakness, Loss of consciousness,  Tremor, insomnia, depression, anxiety, and suicidal ideation.      Objective:  BP 110/72   Pulse 77   Ht 5' 5 (1.651 m)   Wt 214 lb 3.2 oz (97.2 kg)   SpO2 96%   BMI 35.64 kg/m   BP Readings from Last 3 Encounters:  04/16/24 110/72  04/09/24 115/73  02/08/24 (!) 111/57    Wt Readings from Last 3 Encounters:  04/16/24 214 lb 3.2 oz (97.2 kg)  02/08/24 219 lb 1.6 oz (99.4 kg)  09/26/23 212 lb 3.2 oz (96.3 kg)    Physical Exam  Vitals reviewed.  Constitutional:      General: She is not in acute distress.    Appearance: Normal appearance. She is obese. She is not ill-appearing, toxic-appearing or diaphoretic.  HENT:     Head: Normocephalic.  Eyes:     General: No scleral icterus.       Right eye: No discharge.        Left eye: No discharge.     Conjunctiva/sclera: Conjunctivae normal.  Cardiovascular:     Rate and Rhythm: Normal rate and regular rhythm.     Heart sounds: Normal heart sounds.  Pulmonary:     Effort: Pulmonary effort is normal. No respiratory distress.     Breath sounds: Normal breath sounds.  Musculoskeletal:        General: Normal range of motion.  Skin:     General: Skin is warm and dry.  Neurological:     General: No focal deficit present.     Mental Status: She is alert and oriented to person, place, and time. Mental status is at baseline.  Psychiatric:        Mood and Affect: Mood normal.        Behavior: Behavior normal.        Thought Content: Thought content normal.        Judgment: Judgment normal.     Lab Results  Component Value Date   HGBA1C 5.5 09/15/2023   HGBA1C 5.8 12/21/2022   HGBA1C 5.6 06/11/2020    Lab Results  Component Value Date   CREATININE 0.85 04/16/2024   CREATININE 0.89 09/15/2023   CREATININE 0.86 02/27/2023    Lab Results  Component Value Date   WBC 9.4 02/06/2024   HGB 13.6 04/09/2024   HCT 40.2 04/09/2024   PLT 322 02/06/2024   GLUCOSE 116 (H) 04/16/2024   CHOL 195 04/16/2024   TRIG 267.0 (H) 04/16/2024   HDL 47.70 04/16/2024   LDLDIRECT 137.0 04/16/2024   LDLCALC 94 04/16/2024   ALT 18 04/16/2024   AST 19 04/16/2024   NA 138 04/16/2024   K 4.3 04/16/2024   CL 103 04/16/2024   CREATININE 0.85 04/16/2024   BUN 13 04/16/2024   CO2 30 04/16/2024   TSH 2.19 04/16/2024   HGBA1C 5.5 09/15/2023   MICROALBUR <0.7 04/16/2024    No results found.  Assessment & Plan:  .Hypertension, essential Assessment & Plan: Well controlled on current regimen of telmisartan  20 mg and spironolactone  25 mg daily.     Reminded to suspend medication during any GI illness that would lead to dehydration  And to report any recurrent muscle cramping  Lab Results  Component Value Date   CREATININE 0.89 09/15/2023   Lab Results  Component Value Date   NA 138 09/15/2023   K 4.1 09/15/2023   CL 103 09/15/2023   CO2 27 09/15/2023     Orders: -     Comprehensive metabolic panel with GFR -     Microalbumin / creatinine urine ratio  Mixed hyperlipidemia -     Lipid panel -     LDL cholesterol, direct  Other fatigue -     TSH  Hemochromatosis, hereditary Assessment & Plan: Managed by hematology  with periodic phlebotomies ,  most recently last week,  to maintain normal iron saturation .  Reminded to Avoid iron supplementation, alcohol vitamin C supplementation.  Lab Results  Component Value Date   WBC 9.4 02/06/2024   HGB 13.6 04/09/2024   HCT 40.2 04/09/2024  MCV 95.4 02/06/2024   PLT 322 02/06/2024   Lab Results  Component Value Date   IRON 162 02/06/2024   TIBC 307 02/06/2024   FERRITIN 119 02/06/2024      Hypokalemia Assessment & Plan: Resolved with addition of spironolactone  to ARB  Lab Results  Component Value Date   NA 138 09/15/2023   K 4.1 09/15/2023   CL 103 09/15/2023   CO2 27 09/15/2023      Primary osteoarthritis of left knee Assessment & Plan: Moderate tricompartmental disease by prior films currently asymptomatic    Anxiety state Assessment & Plan: Advised to increaser sertraline  to 100 mg as a trial and avoid daily use of alprazolam     Iron overload Assessment & Plan: Now managed with  therapeutic phlebotomy last one Apr 09 2024    Obesity, morbid [facility]) Assessment & Plan: Complicated by hypertension and DJD knee.   I have addressed  BMI and recommended a low glycemic index diet regular participation in aerobic exercise with a goal of 30 minutes of aerobic exercise a minimum of 5 days per week. Screening for lipid disorders, thyroid  and diabetes  has been done    Other orders -     Telmisartan ; Take 1 tablet (20 mg total) by mouth daily.  Dispense: 90 tablet; Refill: 1 -     ALPRAZolam ; Take 1 tablet (0.25 mg total) by mouth at bedtime as needed for anxiety.  Dispense: 20 tablet; Refill: 5 -     Sertraline  HCl; Take 2 tablets (100 mg total) by mouth daily. `  Dispense: 90 tablet; Refill: 1      Follow-up: No follow-ups on file.   Verneita LITTIE Kettering, MD "

## 2024-04-18 ENCOUNTER — Ambulatory Visit: Payer: Self-pay | Admitting: Internal Medicine

## 2024-07-08 ENCOUNTER — Encounter

## 2024-08-05 ENCOUNTER — Inpatient Hospital Stay

## 2024-08-07 ENCOUNTER — Inpatient Hospital Stay: Admitting: Oncology

## 2024-08-07 ENCOUNTER — Inpatient Hospital Stay
